# Patient Record
Sex: Female | Born: 1955 | Marital: Married | Smoking: Never smoker
Health system: Northeastern US, Community
[De-identification: ages and names within clinical notes are randomized; demographics above are authoritative.]

## PROBLEM LIST (undated history)

## (undated) DIAGNOSIS — H11153 Pinguecula, bilateral: Principal | ICD-10-CM

## (undated) DIAGNOSIS — H52209 Unspecified astigmatism, unspecified eye: Secondary | ICD-10-CM

## (undated) DIAGNOSIS — N63 Unspecified lump in unspecified breast: Secondary | ICD-10-CM

## (undated) DIAGNOSIS — A048 Other specified bacterial intestinal infections: Secondary | ICD-10-CM

## (undated) DIAGNOSIS — H1010 Acute atopic conjunctivitis, unspecified eye: Secondary | ICD-10-CM

## (undated) DIAGNOSIS — H5200 Hypermetropia, unspecified eye: Secondary | ICD-10-CM

## (undated) DIAGNOSIS — H524 Presbyopia: Secondary | ICD-10-CM

## (undated) DIAGNOSIS — N83209 Unspecified ovarian cyst, unspecified side: Secondary | ICD-10-CM

## (undated) DIAGNOSIS — Z8679 Personal history of other diseases of the circulatory system: Secondary | ICD-10-CM

## (undated) HISTORY — DX: Pinguecula, bilateral: H11.153

## (undated) HISTORY — DX: Unspecified lump in unspecified breast: N63.0

## (undated) HISTORY — PX: BREAST BIOPSY: SHX20

## (undated) HISTORY — DX: Presbyopia: H52.4

## (undated) HISTORY — DX: Unspecified ovarian cyst, unspecified side: N83.209

## (undated) HISTORY — PX: REDUCTION MAMMAPLASTY: REP20

## (undated) HISTORY — DX: Hypermetropia, unspecified eye: H52.00

## (undated) HISTORY — DX: Other specified bacterial intestinal infections: A04.8

## (undated) HISTORY — DX: Acute atopic conjunctivitis, unspecified eye: H10.10

## (undated) HISTORY — DX: Unspecified astigmatism, unspecified eye: H52.209

---
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
## 1978-06-21 HISTORY — PX: CRANIEC TREPHINE BONE FLP BRAIN TUMOR SUPRTENTOR: PRO0714

## 1988-06-21 HISTORY — PX: BRST RCNSTJ IMMT/DLYD W/TISS EXPANDER SBSQ XPNSJ: REP29

## 1992-06-21 HISTORY — PX: ROOT CANAL: SHX2363

## 1995-06-22 HISTORY — PX: HYSTERECTOMY: SHX81

## 1997-06-21 DIAGNOSIS — N83209 Unspecified ovarian cyst, unspecified side: Secondary | ICD-10-CM

## 1997-06-21 HISTORY — DX: Unspecified ovarian cyst, unspecified side: N83.209

## 1998-06-21 DIAGNOSIS — A048 Other specified bacterial intestinal infections: Secondary | ICD-10-CM

## 1998-06-21 HISTORY — DX: Other specified bacterial intestinal infections: A04.8

## 2006-06-30 ENCOUNTER — Emergency Department (HOSPITAL_BASED_OUTPATIENT_CLINIC_OR_DEPARTMENT_OTHER): Payer: Self-pay

## 2006-06-30 LAB — BLOOD COUNT COMPLETE AUTO&AUTO DIFRNTL WBC
BASOPHIL %: 0.1 % (ref 0.0–2.0)
EOSINOPHIL %: 0 % (ref 0.0–7.0)
HEMATOCRIT: 43.2 % (ref 36.0–48.0)
HEMOGLOBIN: 14.3 g/dl (ref 12.0–16.0)
LYMPHOCYTE %: 5 % — ABNORMAL LOW (ref 13.0–39.0)
MEAN CORP HGB CONC: 33.1 g/dl (ref 32.0–36.0)
MEAN CORPUSCULAR HGB: 28.5 pg (ref 27.0–33.0)
MEAN CORPUSCULAR VOL: 85.9 fl (ref 80.0–100.0)
MEAN PLATELET VOLUME: 8 fl (ref 6.4–10.8)
MONOCYTE %: 4.2 % (ref 1.0–12.0)
NEUTROPHIL %: 90.7 % (ref 46.0–79.0)
PLATELET COUNT: 268 10*3/uL (ref 150–400)
RBC DISTRIBUTION WIDTH: 13.8 % (ref 11.5–14.3)
RED BLOOD CELL COUNT: 5.03 M/uL (ref 4.50–5.10)
WHITE BLOOD CELL COUNT: 8.7 10*3/uL (ref 4.0–10.8)

## 2006-06-30 LAB — URINALYSIS
BILIRUBIN, URINE: NEGATIVE
CASTS: NONE SEEN PER LPF
CRYSTALS: NONE SEEN
GLUCOSE, URINE: NEGATIVE MG/DL
KETONE, URINE: 40 MG/DL — AB
LEUKOCYTE ESTERASE: NEGATIVE
NITRITE, URINE: NEGATIVE
PH URINE: 5.5 (ref 5.0–8.0)
PROTEIN, URINE: NEGATIVE MG/DL
SPECIFIC GRAVITY URINE: >=1.03 (ref 1.003–1.035)
SQUAMOUS EPITHELIAL CELLS: >10 PER LPF — AB (ref 0–2)

## 2006-06-30 LAB — ICTOTEST: ICTOTEST: NEGATIVE

## 2006-06-30 LAB — BASIC METABOLIC PANEL
ANION GAP: 6 mmol/L (ref 2–25)
BUN (UREA NITROGEN): 13 mg/dl (ref 6–20)
CALCIUM: 9.2 mg/dl (ref 8.6–10.0)
CARBON DIOXIDE: 27 mmol/L (ref 22–32)
CHLORIDE: 103 mmol/L (ref 101–111)
CREATININE: 0.8 mg/dl (ref 0.4–1.2)
Glucose Random: 108 mg/dl (ref 74–160)
POTASSIUM: 3.4 mmol/L — ABNORMAL LOW (ref 3.5–5.1)
SODIUM: 136 mmol/L (ref 135–144)

## 2006-06-30 LAB — WBC DIFFERENTIAL SCAN

## 2006-06-30 LAB — HOLD BLUE TOP TUBE

## 2006-06-30 LAB — URINALYSIS DIPSTICK

## 2006-06-30 LAB — CHG ASSAY OF MAGNESIUM: MAGNESIUM: 2 mg/dl (ref 1.8–2.6)

## 2006-07-04 LAB — URINE CULTURE/COLONY COUNT

## 2006-07-04 LAB — EKG

## 2006-07-08 ENCOUNTER — Ambulatory Visit (HOSPITAL_BASED_OUTPATIENT_CLINIC_OR_DEPARTMENT_OTHER): Payer: Self-pay

## 2006-07-19 ENCOUNTER — Encounter (HOSPITAL_BASED_OUTPATIENT_CLINIC_OR_DEPARTMENT_OTHER): Payer: Self-pay | Admitting: Family Medicine

## 2006-07-19 ENCOUNTER — Encounter (HOSPITAL_BASED_OUTPATIENT_CLINIC_OR_DEPARTMENT_OTHER): Payer: Self-pay

## 2006-07-19 ENCOUNTER — Ambulatory Visit (HOSPITAL_BASED_OUTPATIENT_CLINIC_OR_DEPARTMENT_OTHER): Payer: HMO

## 2006-07-19 VITALS — BP 120/70 | HR 76 | Temp 98.5°F | Resp 18 | Wt 135.0 lb

## 2006-07-19 DIAGNOSIS — R112 Nausea with vomiting, unspecified: Secondary | ICD-10-CM

## 2006-07-19 LAB — COMPREHENSIVE METABOLIC PANEL
ALANINE AMINOTRANSFERASE: 15 IU/L (ref 7–35)
ALBUMIN: 3.8 g/dl (ref 3.4–4.8)
ALKALINE PHOSPHATASE: 62 IU/L (ref 25–106)
ANION GAP: 2 mmol/L (ref 2–25)
ASPARTATE AMINOTRANSFERASE: 19 IU/L (ref 8–34)
BILIRUBIN TOTAL: 0.4 mg/dl (ref 0.2–1.1)
BUN (UREA NITROGEN): 10 mg/dl (ref 6–20)
CALCIUM: 9.2 mg/dl (ref 8.6–10.0)
CARBON DIOXIDE: 30 mmol/L (ref 22–32)
CHLORIDE: 108 mmol/L (ref 101–111)
CREATININE: 0.8 mg/dl (ref 0.4–1.2)
Glucose Random: 97 mg/dl (ref 74–160)
POTASSIUM: 4.5 mmol/L (ref 3.5–5.1)
SODIUM: 140 mmol/L (ref 135–144)
TOTAL PROTEIN: 6.6 g/dl (ref 5.9–7.5)

## 2006-07-19 NOTE — Progress Notes (Signed)
SUBJECTIVE:  Kayla Garza is a 51 year old female who presents to establish care with new provider and ED follow-up. Pt presented to ED on 06/30/06 for nausea and vomiting, diagnosed with viral gastroenteritis, symptoms resolved. Pt developed  vomiting for 2hrs on this past thursday, no blood, no bile, +nausea but no abd pain, no fever, no diarrhea, no jaundice. Symptoms resolved completely after 2 hours. +sick contact at work: cold symptoms. Pt denies any heartburn or abdominal pain. Past medical history significant for h. Pylori infection, s/p treatment for 2 months about 8 years ago.  Family hx: mother had stomach ulcers    OBJECTIVE:  Vitals: BP: 120/70 P: 76 T: 98.5 F (36.9 C) T Src: Oral Resp: 18 W: 135 lbs  General: pleasant Portuguese-speaking mid-aged female, sitting comfortably in chair, NAD  HEENT: PERRL, EOMI, mmm  Neck: supple, no thyromegaly, no lymphadenopathy, no JVD  Chest: CTA bilaterally, no wheezes or crackles  Cardiac: RRR, normal S1 and S2 present, no murmurs/rubs/gallops  ABD: soft, non-distended, normal BS, non-tender, no splenomegaly, mild hepatomegaly (about 0.5cm below costal margin)    ASSESSMENT/PLAN:  51 year old female presents with 2 episodes of nausea/vomiting, now resolved. Initial episode likely secondary to viral gastroenteritis. Differential diagnosis also includes hepatic etiology, in light of mildy enlarged liver on exam.  - will obtain CMP today  - f/u in 1 month for CPEX  - call or RTC sooner if symptoms persist or worsen    [name], MD    CASE DISCUSSED WITH DR. Alda Lea

## 2006-07-23 NOTE — Progress Notes (Addendum)
I precepted this 51 yo woman presenting for care after recent visit to ER for acute diarrhea and vomiting illness. Mild hepatomegaly noted on exam. I have reviewed the documented note by Dr. Melene Muller and agree with her assessments and plans as described.

## 2006-08-17 ENCOUNTER — Ambulatory Visit (HOSPITAL_BASED_OUTPATIENT_CLINIC_OR_DEPARTMENT_OTHER): Payer: HMO

## 2006-08-17 ENCOUNTER — Encounter (HOSPITAL_BASED_OUTPATIENT_CLINIC_OR_DEPARTMENT_OTHER): Payer: Self-pay

## 2006-08-17 VITALS — BP 110/70 | HR 69 | Temp 98.5°F | Resp 20 | Ht 63.0 in | Wt 139.4 lb

## 2006-08-17 DIAGNOSIS — R1013 Epigastric pain: Secondary | ICD-10-CM

## 2006-08-17 DIAGNOSIS — Z0000 Encounter for general adult medical examination without abnormal findings: Secondary | ICD-10-CM

## 2006-08-17 NOTE — Progress Notes (Signed)
Chief Complaint:  Kayla Garza is a 51 year old female who presents for a physical exam. Patient has the following concern:    1. Epigastric pain: occurs almost every day, occurs mostly when pt is hungry, does not occurs after meals, pain improves with meals, no melena or hematochezia, no fever, no weight loss, no nausea or vomiting, no diarrhea, has not tried any meds.      No current outpatient prescriptions on file.      Allergies:    No Known Allergies.    Health Maintenance:      Immunizations:  Immunization History   TD 06/21/1997     Histories:  Past Medical History:   HELICOBACTER PYLORI INFECTION 2000    Comment: s/p treatment for 2 months   OVARIAN CYST NEC/NOS 1999   Past Surgical History:   REMOVAL OF BRAIN LESION 1980    BREAST RECONSTRUCTION 1990    Comment: s/p removal of excess skin   TOTAL HYSTERECTOMY 1997    Comment: surgery for uterine fibroids  Social History   Marital Status: Married Spouse Name: Teacher, English as a foreign language    Years of Education: Number of children: 0     Occupational History   None on file    Social History Main Topics   Tobacco Use: Never    Alcohol Use: No    Drug Use: Not on file    Sexual Activity: Yes Partners with: Female    Other Topics Concern   None on file    Social History Narrative   Works with Public house manager, lives with husband, feels safe      Review of patient's family history indicates:   Hypertension Mother    Osteoporosis Mother    Psychiatric Illness Brother    Comment: depression      Gyn: menarche age 84, married for 15 years, 3 lifetime partners, no h/o STD's, no abnormal pap smears    Review of Systems:   Skin: negative  Eyes: negative  Ears/Nose/Throat: negative  Respiratory: negative  Cardiovascular: negative  Gastrointestinal: see HPI  Genitourinary: negative  Gyn:  no breast pain or new or enlarging lumps on self exam  no vaginal bleeding    Musculoskeletal: negative  Neurologic: negative  Psychiatric: negative  Hematologic/Lymphatic/Immunologic:  negative    Physical:  BP 110/70   Pulse 69   Temp (Src) 98.5 (Oral)   Resp 20   Ht 5\' 3"  (1.31m)   Wt 139 lbs 6.4 oz (63.2kg)   SaO2 100%   LMP Hysterectomy  General appearance: healthy, alert, well developed, well nourished, cooperative, smiling, no distress  Eyes: conjunctivae/corneas clear. PERRL, EOM's intact. Fundi benign  Skin: skin color, texture, turgor are normal  Head: Normocephalic. No masses, lesions, tenderness or abnormalities  Ears: External ears normal. Canals clear. TM's normal.  Nose/Sinuses: Nares normal. Septum midline. Mucosa normal. No drainage or sinus tenderness.  Oropharynx: Lips, mucosa, and tongue normal. Teeth and gums normal. Oropharynx moist and without lesion  Neck: Neck supple. No adenopathy. Thyroid symmetric, normal size, and without nodularity  Back: Back symmetric, no curvature. ROM normal. No CVA tenderness.  Lungs: Percussion normal. Good diaphragmatic excursion. Lungs clear to auscultation bilaterally  Heart: PMI normal. No lifts, heaves, or thrills. RRR. No murmurs, clicks, gallops or rubs  Breast: breasts symmetric, no dominant or suspicious mass, no skin or nipple changes, no axillary adenopathy and self exam in taught and encouraged  Abdomen: Abdomen soft, non-tender. BS normal. No masses, no organomegaly  Extremities: Extremities  normal. No deformities, edema, or skin discoloration  Musculoskeletal: Spine ROM normal. Muscular strength intact.  Peripheral pulses: negative  Neuro: Gait normal. Reflexes normal and symmetric. Sensation grossly normal   Pelvic: normal adnexa, s/p hysterectomy  Rectal: negative, no tenderness noted, stool guiaic negative    Health Counseling:  Diet, Exercise, Wt. Control: Reviewed and Discussed  Dental Health: Reviewed and Discussed  Vision Health: Reviewed and Discussed  Mental Health: Reviewed and Discussed  Domestic Violence: Reviewed and Discussed  BSE: Reviewed and Discussed  Osteoperosis: Reviewed and Discussed      ASSESSMENT/PLAN:  51  year old female presents for CPEX    V70.0 ROUTINE MEDICAL EXAM (primary encounter diagnosis)  Note: healthy 51 year old female  Plan:   - immunizations reviewed and uptodate  - health maintenance counseling performed  - mammogram order sheet given, pt will schedule appt  - pap smear not indicated, s/p hysterectomy for benign fibroids, normal pap smear in [place] since surgery  - will discuss fasting lipid panel at next office visit    789.06 ABDOMINAL PAIN EPIGASTRIC  Note: non-ulcerative dyspepsia vs. PUD  Plan:   - HELICOBACTER PYLORI STOOL AG (INHOUSE)  - f/u in 6 weeks  - consider PPI therapy if negative stool antigen test and persitent symptoms      [name], MD    CASE DISCUSSED WITH DR. Alda Lea

## 2006-08-21 NOTE — Progress Notes (Addendum)
I precepted this 51 yo woman presenting for complete physical with Dr. Melene Muller. Chronic midepigastric abdominal pain, dyspepsia with hx of Hpylori infection addressed by obtaining stool antigen for this organism and anticipate trial of PPI at later date. I have reviewed the documented note and agree with the assessments and plans as described.

## 2006-08-24 ENCOUNTER — Other Ambulatory Visit (HOSPITAL_BASED_OUTPATIENT_CLINIC_OR_DEPARTMENT_OTHER): Payer: HMO

## 2006-08-24 DIAGNOSIS — R1013 Epigastric pain: Secondary | ICD-10-CM

## 2006-08-24 NOTE — Progress Notes (Signed)
LAB DROP OFF

## 2006-08-25 LAB — HELICOBACTER PYLORI STOOL AG

## 2006-09-06 ENCOUNTER — Ambulatory Visit (HOSPITAL_BASED_OUTPATIENT_CLINIC_OR_DEPARTMENT_OTHER): Payer: Self-pay

## 2006-09-06 LAB — EMERGENCY ROOM NOTE

## 2006-10-14 ENCOUNTER — Ambulatory Visit (HOSPITAL_BASED_OUTPATIENT_CLINIC_OR_DEPARTMENT_OTHER): Payer: HMO

## 2006-10-14 VITALS — BP 114/82 | HR 78 | Temp 98.6°F | Resp 20 | Wt 140.2 lb

## 2006-10-14 DIAGNOSIS — Z13220 Encounter for screening for lipoid disorders: Secondary | ICD-10-CM

## 2006-10-14 DIAGNOSIS — R1013 Epigastric pain: Secondary | ICD-10-CM

## 2006-10-14 DIAGNOSIS — M25569 Pain in unspecified knee: Secondary | ICD-10-CM

## 2006-10-14 NOTE — Progress Notes (Signed)
PRECEPTOR NOTE  On the day of the patient's visit, I reviewed findings with the resident.  I confirm the key elements of history and physical exam as described in resident's note.  I agree with the assessment and plan as described below.  Please see resident's note for further details.

## 2006-10-14 NOTE — Progress Notes (Signed)
SUBJECTIVE:  Kayla Garza is a 51 year old female who presents for follow-up of abdominal pain. Pain now occurs once weekly, mostly after fatty meals, no n/v, no fever/chills, no blood in stool, no weight loss. She has not tried any medications.   Patient also reports right knee pain: started 8 months ago, dull non-radiating pain, progressively worse, occurs at night, injured right knee in August 2007, no clicking but +locking/giving way, no effusion or erythema, no numbness/weakness.    OBJECTIVE:  BP 114/82   Pulse 78   Temp (Src) 98.6 F (37 C) (Oral)   Resp 20   Wt 140 lb 3.2 oz (63.594 kg)   SpO2 99%   LMP Hysterectomy  General: pleasant mid-aged [place] female, sitting comfortably, NAD  Chest: CTA bilaterally, no wheezes or crackles  Cardiac: RRR, normal S1 and S2 present, no murmurs/rubs/gallops  ABD: soft, non-distended, normal BS, non-tender  Right knee: +McMurray's with internal rotation, point tenderness in medial area (about 1cm from medial aspect of patella), full ROM, no joint effusion or erythema, sensation to blunt touch grossly intact, 5/5 lower extremity strength b/l, 2+ patellar DTR    ASSESSMENT/PLAN:  51 year old female presents with epigastric pain and right knee pain.    789.06 Abdominal Pain, Epigastric (primary encounter diagnosis)  Comment: possibly due to cholelithiasis  Plan:   - will obtain RUQ ultrasound for further evaluation  - call or RTC sooner if symptoms persist or worsen    ROUTINE HEALTH CARE MAINTENANCE  Plan:   - pt will return for fasting LIPID PANEL, ROUTINE VENIPUNCTURE  - she will reschedule appt for mammogram    719.46D Knee Pain  Comment: clinical picture consistent with meniscal tear  Plan:  - will obtain R knee x-ray to r/o bony abnormalities  - ibuprofen 400mg  po TID for 2 weeks, ice prn  - follow-up in 4 weeks  - consider joint corticosteroid injection if pain persists      [name], MD    CASE DISCUSSED WITH DR. Azucena Fallen

## 2006-10-14 NOTE — Patient Instructions (Signed)
Ibuprofena 400 mg 3 vezes por dia (tomar com comida)    Gelo ao joelho

## 2006-10-20 ENCOUNTER — Other Ambulatory Visit (HOSPITAL_BASED_OUTPATIENT_CLINIC_OR_DEPARTMENT_OTHER): Payer: Self-pay | Admitting: Family Medicine

## 2006-10-21 LAB — XR KNEE RIGHT 3 VIEWS

## 2006-11-04 LAB — MA SCREENING MAMMO BILATERAL WITH CAD

## 2006-11-11 ENCOUNTER — Ambulatory Visit (HOSPITAL_BASED_OUTPATIENT_CLINIC_OR_DEPARTMENT_OTHER): Payer: Self-pay

## 2006-11-15 LAB — MA DIAGNOSTIC MAMMO UNILATERAL RIGHT WITH CAD

## 2006-11-15 LAB — US BREAST-AXILLA RIGHT

## 2006-11-28 ENCOUNTER — Ambulatory Visit (HOSPITAL_BASED_OUTPATIENT_CLINIC_OR_DEPARTMENT_OTHER): Payer: HMO

## 2006-12-16 ENCOUNTER — Ambulatory Visit (HOSPITAL_BASED_OUTPATIENT_CLINIC_OR_DEPARTMENT_OTHER): Payer: BLUE CROSS/BLUE SHIELD

## 2006-12-16 VITALS — BP 120/80 | HR 68 | Temp 98.5°F | Resp 18 | Wt 140.0 lb

## 2006-12-16 DIAGNOSIS — M25569 Pain in unspecified knee: Secondary | ICD-10-CM

## 2006-12-16 MED ORDER — IBUPROFEN 600 MG PO TABS
ORAL_TABLET | ORAL | Status: AC
Start: 2006-12-16 — End: 2006-12-30

## 2006-12-16 NOTE — Progress Notes (Signed)
SUBJECTIVE:  Kayla Garza is a 51 year old female who presents for follow-up of right Garza pain. 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 Pt did not take ibuprofen as previously prescribed at last office visit.    OBJECTIVE:  BP 120/80   Pulse 68   Temp (Src) 98.5 F (36.9 C) (Oral)   Resp 18   Wt 140 lb (63.504 kg)   LMP Hysterectomy  General: pleasant mid-aged [place] female, sitting comfortably, NAD  Right Garza: negative McMurray's test, point tenderness in medial area (about 1cm from medial aspect of patella), full ROM, no joint effusion or erythema, sensation to blunt touch grossly intact, 5/5 lower extremity strength b/l, 2+ patellar DTR, normal hip exam bilaterally except for pain elicited with internal rotation.    Garza x-ray: normal    ASSESSMENT/PLAN:  51 year old female presents with right Garza pain, possibly due to meniscal tear vs. Hip etiology  - ibuprofen 600mg  po TID for 2 weeks, then may use prn  - may use PPI OTC daily for GI prophylaxis  - follow-up in 4 weeks  - consider bilateral hip weight-bearing views if symptoms persist or worsen      Kayla Knee, MD    CASE DISCUSSED WITH DR. PALACIO

## 2006-12-20 NOTE — Progress Notes (Addendum)
This case was d/w Dr. Batista. Agree with findings and plan as documented in her note.

## 2007-01-12 ENCOUNTER — Ambulatory Visit (HOSPITAL_BASED_OUTPATIENT_CLINIC_OR_DEPARTMENT_OTHER): Payer: BLUE CROSS/BLUE SHIELD

## 2007-04-04 ENCOUNTER — Encounter (HOSPITAL_BASED_OUTPATIENT_CLINIC_OR_DEPARTMENT_OTHER): Payer: Self-pay

## 2007-04-05 ENCOUNTER — Encounter (HOSPITAL_BASED_OUTPATIENT_CLINIC_OR_DEPARTMENT_OTHER): Payer: Self-pay

## 2007-04-22 ENCOUNTER — Encounter (HOSPITAL_BASED_OUTPATIENT_CLINIC_OR_DEPARTMENT_OTHER): Payer: Self-pay

## 2007-04-22 ENCOUNTER — Emergency Department (HOSPITAL_BASED_OUTPATIENT_CLINIC_OR_DEPARTMENT_OTHER)
Admission: RE | Admit: 2007-04-22 | Disposition: A | Discharge status: Home / Self Care | Payer: Self-pay | Source: Emergency Department

## 2007-04-22 MED ORDER — LEVAQUIN 500 MG PO TABS
ORAL_TABLET | ORAL | Status: DC
Start: 2007-04-22 — End: 2007-04-23

## 2007-04-22 MED ORDER — HYCODAN 5-1.5 MG/5ML PO SYRP
ORAL_SOLUTION | ORAL | Status: AC
Start: 2007-04-22 — End: 2007-05-22

## 2007-04-22 NOTE — ED Notes (Signed)
<  body>  <div align="left"><font face="Arial"><span style="font-size:12pt">2 WEEKS OF DRY COUGH, WORSE PAST 24 HRS</span></font></div>  </body>

## 2007-04-22 NOTE — ED Notes (Signed)
<  body>  <div align="left"><font face="Arial"><span style="font-size:12pt">NO RELEIF WITH OTC COUGH MED</span></font></div>  </body>

## 2007-04-22 NOTE — Discharge Instructions (Signed)
Bronquite  (Bronchitis)    Seu médico diagnosticou que você tem bronquite. Bronquite é uma inflamação nos brônquios. Os brônquios são as passagens de ar ou tubos que se estendem da traquéia até os pulmões. Essa inflamação pode ser causada por vírus, bactéria, pó, alérgenos, poluição e outros agentes irritantes. Se a inflamação fica severa que pode causar brevidade de respiração.    causas  As células que revestem a árvore bronquial estão cobertas por minúsculos pêlos (cílios). Estes batem constantemente para cima e para longe dos pulmões em direção à boca para manter o pulmão livre de poluentes que poderiam afetar o nosso bem-estar. Quando essas células ficam irritadas e incapazes de trabalhar normalmente, começa a surgir muco, que provoca a tosse característica da bronquite. A tosse se torna o mecanismo de limpeza dos pulmões quando os cílios são incapazes de fazê-la. Sem esses mecanismos de defesa, o material ficaria nos pulmões e a pessoa desenvolveria uma pneumonia. Essa é uma das razões pelas quais é necessário evitar supressores de tosse, pois eles retiram essa proteção, podendo ser usados no caso da perda de respiração por causa da tosse.     Não há dúvida que o cigarro é a causa mais comum da bronquite. Parar de fumar é o mais importante que você pode fazer para ajudar.    tratamento  · Seu médico podem ter prescrito um antibiótico se ele sente que sua tosse é causada através de bactérias, e medicinas que abrem suas vias aéreas para fazer isto mais fácil respirar. Ele pode também recomendar ou prescrever um expectorante para eliminar o muco. Acetaminofeno (Tylenol®) ou ibuprofeno (Advil® ou Motrin®) podem ser usados para altas temperaturas ou dores associadas.  Se a bronquite continuar não tratada, ela pode tornar-se crônica (muco causando tosse que dura três ou mais meses repetindo-se todos as anos). Se a bronquite crônica progredir, ela pode ficar irreversível e a remoção do agente causador (cigarro, por  exemplo) não ajudará tanto ou não ajudará em nada. Nos primeiros anos desse problema, os raios X permanecerão normais.    Procure assistência médica imediatamente se:  · Se você desenvolver mais catarro purulento durante o tratamento.  Você tem uma febre descontrolado através de medicamentos.  Você fica progressivamente mais doente.   Você aumentou dificuldade respirando, enquanto ofegando, ou brevidade de respiração.    É necessário ir ao médico ou ao pronto-socorro imediatamente, principalmente se você é idoso ou estiver debilitado por outra doença.  Document Released: 06/07/2005  Document Re-Released: 03/24/2006  [employer]® Patient Information ©2008 [employer].

## 2007-04-23 LAB — XR CHEST 2 VIEWS

## 2007-04-23 MED ORDER — AVELOX 400 MG PO TABS
ORAL_TABLET | ORAL | Status: AC
Start: 2007-04-23 — End: 2007-05-23

## 2007-04-23 MED ORDER — LEVAQUIN 500 MG PO TABS
ORAL_TABLET | ORAL | Status: DC
Start: 2007-04-23 — End: 2007-04-23

## 2007-05-15 LAB — EMERGENCY ROOM NOTE

## 2007-09-12 ENCOUNTER — Ambulatory Visit (HOSPITAL_BASED_OUTPATIENT_CLINIC_OR_DEPARTMENT_OTHER): Payer: HMO

## 2007-09-12 VITALS — BP 120/88 | HR 88 | Temp 97.3°F | Resp 17 | Wt 154.2 lb

## 2007-09-12 DIAGNOSIS — R5383 Other fatigue: Secondary | ICD-10-CM

## 2007-09-12 DIAGNOSIS — M79606 Pain in leg, unspecified: Secondary | ICD-10-CM

## 2007-09-12 LAB — BLOOD COUNT COMPLETE AUTOMATED
HEMATOCRIT: 40.6 % (ref 36.0–48.0)
HEMOGLOBIN: 13.2 g/dl (ref 12.0–16.0)
MEAN CORP HGB CONC: 32.5 g/dl (ref 32.0–36.0)
MEAN CORPUSCULAR HGB: 28.8 pg (ref 27.0–33.0)
MEAN CORPUSCULAR VOL: 88.5 fl (ref 80.0–100.0)
MEAN PLATELET VOLUME: 8.9 fl (ref 6.4–10.8)
PLATELET COUNT: 270 10*3/uL (ref 150–400)
RBC DISTRIBUTION WIDTH: 14 % (ref 11.5–14.3)
RED BLOOD CELL COUNT: 4.58 M/uL (ref 4.50–5.10)
WHITE BLOOD CELL COUNT: 5.9 10*3/uL (ref 4.0–10.8)

## 2007-09-12 LAB — RAPID STREP (POINT OF CARE): STREP SCREEN: NEGATIVE

## 2007-09-12 LAB — CYANOCOBALAMIN VITAMIN B-12: VITAMIN B12: 253 pg/mL (ref 180–914)

## 2007-09-12 LAB — TSH (THYROID STIMULATING HORMONE): TSH (THYROID STIM HORMONE): 1.38 u[IU]/mL (ref 0.34–5.60)

## 2007-09-12 NOTE — Progress Notes (Signed)
SUBJECTIVE:  Kayla Garza is a 52 year old female who presents with multiple concerns.    # Leg pain  - occurs mostly at night, started 2 months ago, symptoms unchanged since then  - lower legs, especially calves  - no radiation  - burning sensation  - lasts all night long, changes position often in bed without improvement  - has not tried any medications  - no leg swelling, no rash  - no numbness or weakness  - pain is non-exertional    # Fatigue  - easily fatigued for 3 months, decreased energy  - gained 6.5 kgs in the past few months  - normal appetite, difficulty falling asleep and staying asleep  - no fever, occasional palpitations/throat closing up at times  - forgetful at times, normal concentration  - new onset constipation for the past 3 months  - no skin/hair/nail changes  - feeling more anxious lately, irritable when feeling tired    OBJECTIVE:  BP 120/88   Pulse 88   Temp (Src) 97.3 F (36.3 C) (Oral)   Resp 17   Wt 154 lb 3.2 oz (69.945 kg)   SpO2 99%   LMP Hysterectomy  General: well-appearing mid-aged [place] female, NAD  Neck: supple, no thyromegaly, no lymphadenopathy  Chest: CTA bilaterally, no wheezes or crackles  Cardiac: RRR, normal S1 and S2 present, no murmurs/rubs/gallops  ABD: soft, non-distended, normal BS, non-tender  Ext: warm, mild bilateral pretibial tenderness to palpation, no pedal edema, no discoloration or lesions, 2+ PT pulses b/l, symmetric patellar DTR's, 5/5 lower extremity strength, sensation to blunt touch grossly intact    Thyroid u/s 03/08/07 from [place]: diffuse parenchymal alterations, no nodule     ASSESSMENT/PLAN:  52 year old female presents with leg pain and fatigue.    780.79B Fatigue  (primary encounter diagnosis)  Comment: differential includes thyroid abnormality, depression and anemia.  Plan:   - THYROID STIM HORMONE  - COMPLETE CBC, AUTOMATED  - follow-up in 6 weeks  - call or RTC sooner if symptoms persist or worsen    729.5H Leg Pain  Comment:  likely neuropathic etiology, differential includes vitamin deficiencies.  Plan:   - VITAMIN D,25 HYDROXY  - VITAMIN B-12  - follow-up in 6 weeks  - call or RTC sooner if symptoms persist or worsen      [name], MD    CASE DISCUSSED WITH DR. PLATT

## 2007-09-12 NOTE — Progress Notes (Signed)
PRECEPTOR NOTE  I personally discussed and reviewed the history, physical, and plan with the resident.  Please see resident's note for further details.

## 2007-09-14 LAB — VITAMIN D,25 HYDROXY: VITAMIN D,25 HYDROXY: 11.9 ng/mL — AB (ref 32.0–100.0)

## 2007-09-18 ENCOUNTER — Telehealth (HOSPITAL_BASED_OUTPATIENT_CLINIC_OR_DEPARTMENT_OTHER): Payer: Self-pay | Admitting: Family Medicine

## 2007-09-18 DIAGNOSIS — E559 Vitamin D deficiency, unspecified: Secondary | ICD-10-CM

## 2007-09-18 DIAGNOSIS — E663 Overweight: Secondary | ICD-10-CM

## 2007-09-18 DIAGNOSIS — IMO0002 Reserved for concepts with insufficient information to code with codable children: Secondary | ICD-10-CM

## 2007-09-18 NOTE — Telephone Encounter (Signed)
Staff Message copied by Earlyne Iba on Mon Sep 18, 2007 6:56 PM  ------   Message from: Bufford Lope   Created: Mon Sep 18, 2007 6:43 PM   Regarding: call patient    Kayla Garza [id number], 52 year old, female, Telephone Information:  Home Phone [phone]  Work Phone [phone]      Cleotis Lema NUMBER: [phone]  Cell phone:   Other phone:    Available times:    Patient's language of care: [place]    Patient does not need an interpreter.    Patient's PCP: [name], MD    Person calling on behalf of patient: Patient (self)    Calls today   to speak to provider only.  Returning phonecall      Patient's Preferred Pharmacy:   [facility] [address] [place]  Phone: [phone] Fax: [phone]

## 2007-09-18 NOTE — Telephone Encounter (Signed)
PAteint states that she was returning Dr Theora Master call regarding her Bone scan.She is aware that pcp has left for the day and will wait until tomorrow

## 2007-09-19 DIAGNOSIS — IMO0002 Reserved for concepts with insufficient information to code with codable children: Secondary | ICD-10-CM

## 2007-09-19 DIAGNOSIS — E663 Overweight: Secondary | ICD-10-CM | POA: Insufficient documentation

## 2007-09-19 MED ORDER — ERGOCALCIFEROL 1.25 MG (50000 UT) PO CAPS
ORAL_CAPSULE | ORAL | Status: DC
Start: 2007-09-19 — End: 2013-02-16

## 2007-09-19 NOTE — Telephone Encounter (Signed)
Discussed with patient regarding low vitamin D as possible cause of burning sensation in lower extremities.  Ergocalciferol 50,000 units qweek for 8 weeks, then will repeat vitamin D level.    Pt would like a referral to nutrition due to her recent weight gain and being overweight.  Nutrition referral ordered, pt will schedule own appt with Lajean Manes.    [name], MD

## 2007-09-19 NOTE — Telephone Encounter (Signed)
Staff Message copied by Caffie Pinto on Tue Sep 19, 2007 12:22 PM  ------   Message from: [place], [place]   Created: Tue Sep 19, 2007 12:12 PM   Regarding: call back    Kayla Garza Geralynn Capri [id number], 52 year old, female, Telephone Information:  Home Phone [phone]  Work Phone [phone]      Cleotis Lema NUMBER: [phone]  Cell phone:   Other phone:    Available times:    Patient's language of care: [place]    Patient does not need an interpreter.    Patient's Care Team:     Person calling on behalf of patient: Patient (self)    Calls today Returning phonecall fromDr. Melene Muller re: blood work     Patient's Preferred Pharmacy: [facility] [address] [place]  Phone: [phone] Fax: [phone]

## 2007-10-30 ENCOUNTER — Ambulatory Visit (HOSPITAL_BASED_OUTPATIENT_CLINIC_OR_DEPARTMENT_OTHER): Payer: HMO

## 2007-10-30 VITALS — BP 128/80 | HR 79 | Temp 98.8°F | Wt 149.0 lb

## 2007-10-30 DIAGNOSIS — Z23 Encounter for immunization: Secondary | ICD-10-CM

## 2007-10-30 DIAGNOSIS — E559 Vitamin D deficiency, unspecified: Secondary | ICD-10-CM

## 2007-10-30 NOTE — Progress Notes (Signed)
10/30/2007  VIS given prior to administration and reviewed with the patient and or legal guardian. Patient understands the disease and the vaccine. See immunization/Injection module or chart review for date of publication and additional information.  Earlyne Iba, RN

## 2007-10-30 NOTE — Progress Notes (Signed)
SUBJECTIVE:  Kayla Garza is a 52 year old female who presents for follow-up of fatigue and leg pain.  Her symptoms have now resolved.  She has been taking vitamin D 1000 IU po qweek (was told by pharmacist that this would be equivalent to 50,000 IU qweek).      OBJECTIVE:  BP 128/80   Pulse 79   Temp (Src) 98.8 F (37.1 C) (Oral)   Wt 149 lb (67.586 kg)   SpO2 98%   LMP Hysterectomy  General: well-appearing female, NAD  Neck: supple, no thyromegaly, no lymphadenopathy, no JVD  Chest: CTA bilaterally, no wheezes or crackles  Cardiac: RRR, normal S1 and S2 present, no murmurs/rubs/gallops    ASSESSMENT/PLAN:  52 year old female presents with fatigue and leg pain secondary to vitamin D deficiency, symptoms have now resolved.  - d/c vitamin D 1000 IU qweek  - start vitamin D 16109 IU qweek for 8 weeks, called pharmacy to confirm appropriate dose  - follow-up in 2 months, will repeat vitamin D level at next visit  - HCM: pt is due for tetanus booster, vaccine given today      Kayla Knee, MD    CASE DISCUSSED WITH DR. Azucena Fallen

## 2007-10-30 NOTE — Progress Notes (Signed)
PRECEPTOR NOTE:  On the day of the patient's visit, I discussed the key elements of history and physical exam and I reviewed the findings with the resident.  I agree with the assessment and plan as described in their documentation.  Please see resident's note for further details.

## 2007-10-31 ENCOUNTER — Telehealth (HOSPITAL_BASED_OUTPATIENT_CLINIC_OR_DEPARTMENT_OTHER): Payer: Self-pay | Admitting: Ambulatory Care

## 2007-10-31 NOTE — Telephone Encounter (Signed)
Called patient back. She said she needs to speak to Dr. Melene Muller about her rx for vitamin D.

## 2007-10-31 NOTE — Telephone Encounter (Signed)
Called pharmacy back. They wanted to verify the units from the 3/31 rx given to patient. Advised that is was 50,000 units.

## 2007-10-31 NOTE — Telephone Encounter (Signed)
Staff Message copied by Caffie Pinto on Tue Oct 31, 2007 10:21 AM  ------   Message from: [place], [place]   Created: Tue Oct 31, 2007 10:09 AM   Regarding: med question   Contact: [phone]    Kayla Garza [id number], 52 year old, female, Telephone Information:  Home Phone [phone]  Work Phone [phone]  Mobile [phone]      Cleotis Lema NUMBER: [phone]  Best time to call back:   Cell phone:   Other phone:    Available times:    Patient's language of care: [place]    Patient does not need an interpreter.    Patient's PCP: [name], MD    Person calling on behalf of patient: [name])    Calls today with questions and concerns. Pl's call her back     Patient's Preferred Pharmacy:   [facility] [address] [place]  Phone: [phone] Fax: [phone]

## 2007-10-31 NOTE — Telephone Encounter (Signed)
Pt was concerned that the pharmacy gave her the wrong dose of vitamin D.  She picked up a prescription yesterday for Vit D 1000 IU qweek.  Pt remembered that the correct dosage should be 50,000 IU qweek.  I called the pharmacy directly, and the manager claims that there is a handwritten voicemail from Dr. Melene Muller for Vitamin D 1000 IU qweek from yesterday.  I assured the manager that I spoke to someone yesterday about pt needing the right dose: Vitamin D 50,000 IU qweek.  When pt had come in 2 months ago for this original script, she was told by someone at that pharmacy that 1000 IU qweek was equivalent to the higher dosage (and has been taking subtherapeutic doses of this med for the past 2 months).  I requested that the pharmacy fill the correct dose for the patient and not charge her.  I also requested a formal complaint to be filed as the pt has now received 2 wrong doses of Vitamin D.  Pt notified and will come in to pick up new script.    [name], MD

## 2007-10-31 NOTE — Telephone Encounter (Signed)
Staff Message copied by Caffie Pinto on Tue Oct 31, 2007 9:37 AM  ------   Message from: [place], [place]   Created: Tue Oct 31, 2007 9:36 AM   Regarding: call back    Andrienne Da Nalina Yeatman [id number], 52 year old, female, Telephone Information:  Home Phone [phone]  Work Phone [phone]  Mobile [phone]      Cleotis Lema NUMBER: [phone]  Cell phone:   Other phone:    Available times:    Patient's language of care: [place]    Patient does not need an interpreter.    Patient's Care Team:     Person calling on behalf of patient: Spouse    Calls today with questions and concerns. Re: meds. Pls call back would like to talk to Dr. Melene Muller.    Patient's Preferred Pharmacy: [facility] [address] [place]  Phone: [phone] Fax: [phone]

## 2007-11-16 ENCOUNTER — Telehealth (HOSPITAL_BASED_OUTPATIENT_CLINIC_OR_DEPARTMENT_OTHER): Payer: Self-pay | Admitting: Family Medicine

## 2007-11-16 NOTE — Telephone Encounter (Signed)
Spoke with patient reminded to make mammogram appointment prior to PE on 12-12-07, patient agrees with plan.

## 2007-11-16 NOTE — Telephone Encounter (Signed)
Staff Message copied by Velna Ochs on Thu Nov 16, 2007 3:55 PM  ------   Message from: [name]   Created: Thu Nov 16, 2007 3:53 PM   Regarding: Mammogram    This pt is due for 6 month follow-up mammogram. She was given an order sheet at the last visit. Can one of the nurses please call her & remind her to set up her appt prior to next visit with me?    Thanks,  [name], MD

## 2007-11-22 ENCOUNTER — Ambulatory Visit (HOSPITAL_BASED_OUTPATIENT_CLINIC_OR_DEPARTMENT_OTHER): Payer: Self-pay

## 2007-11-23 LAB — MA SCREENING MAMMO BILATERAL WITH CAD

## 2007-12-05 ENCOUNTER — Ambulatory Visit (HOSPITAL_BASED_OUTPATIENT_CLINIC_OR_DEPARTMENT_OTHER): Payer: HMO | Admitting: Registered"

## 2007-12-05 VITALS — Ht 63.0 in | Wt 149.0 lb

## 2007-12-05 DIAGNOSIS — E663 Overweight: Secondary | ICD-10-CM

## 2007-12-05 DIAGNOSIS — IMO0002 Reserved for concepts with insufficient information to code with codable children: Principal | ICD-10-CM

## 2007-12-05 NOTE — Progress Notes (Signed)
INITIAL NUTRITION ASSESSMENT / INTERVENTION    Beginning Time: 6:30 PM  End Time: 7:30 PM     Total Minutes: 60 minutes      Kayla Garza is a 52 year old female who presents with overweight.  Visit via [place], 1610, via phone.  The patient is from [place].  Patient's language is [place]. Patient able to read [place].  She has been in the [place] for 23 years.  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 Patient's work schedule is full-time and 45-50 hours/wk.  Patient lives with husband.     Subjective (including lifestyle changes): Patient reports cut sweets, more salad, more calcium sources.        Family History    Hypertension Mother    Osteoporosis Mother    Psychiatric Illness Brother    Comment: depression           Patient has had overweight for 7-12 months and has not had Medical Nutrition Therapy before today.      Current outpatient prescriptions:  ERGOCALCIFEROL 50000 UNIT OR CAPS 1 CAPSULE BY MOUTH ONCE WEEKLY FOR 8 WEEKS Disp: 8 Rfl: 0         Vitamins/Minerals/Herbal Supplements: per outpatient prescriptions above       Tobacco Use: Never    Alcohol Use: No       Review of Patient's Allergies indicates:  No Known Allergies.    Labs reviewed: Yes  (For list of labs type "dot" LLNUTA)    Vitals:  Most Recent BP Reading(s)    Date:        BP:    10/30/2007   128/80          Most Recent Height Reading(s)    Date:        Ht:    12/05/2007   5\' 3"  (1.6 m)      Most Recent Weight Reading(s)     Date:        Wt:     12/05/2007   149 lb (67.586 kg)     Weight change: Lost 5 pounds in the past 2 month(s).    Body mass index is 26.39 kg/(m^2).    BMI Category: 25-29.9 overweight    Physical Activity:  Type: walking, 1-2x/wk for 40 mins     Barriers to physical activity include: none    Activities of Daily Living: Independent    Chewing ability: no concerns   Swallowing ability: no concerns   Appetite: good    Food Allergies: No known allergies  Food Intolerances: none       Religious  restrictions/Special diet: None  Food purchased by: pt's husband       Food prepared by: pt  Meal sources: home cooked and take out / [employer] factors and food assist: none  Psychosocial factors / Mental Status: interactive with questions  Stress: Moderate  Readiness to learn: good    Dietary history / usual intake reveals patient attempts to follow reduced calories diet.  She eats 2-3 meals and sometimes snacks per day.      Portion size is excessive but improving.  Eating frequency is deficient  Eating habits are culturally based [place]    Estimated intake shows:   sub-optimal nutrients: monosaturated fats, vitamin C, fiber, fluid and water  excessive nutrients: calories and carbohydrates      Assessment/Plan/Conclusion:  Discussed with patient the good changes she has made over the past few weeks; cut desserts and  sweets, trying to eat more veggies. Reviewed plate picture as well as increasing activity per MD OK to more than 2x/wk. Agreeable to ideas.    Follow-up: 2 months.    Freddi Che

## 2007-12-09 ENCOUNTER — Ambulatory Visit (HOSPITAL_BASED_OUTPATIENT_CLINIC_OR_DEPARTMENT_OTHER): Payer: HMO

## 2007-12-12 ENCOUNTER — Ambulatory Visit (HOSPITAL_BASED_OUTPATIENT_CLINIC_OR_DEPARTMENT_OTHER): Payer: HMO

## 2008-02-07 ENCOUNTER — Ambulatory Visit (HOSPITAL_BASED_OUTPATIENT_CLINIC_OR_DEPARTMENT_OTHER): Payer: HMO | Admitting: Registered"

## 2009-05-11 ENCOUNTER — Emergency Department (HOSPITAL_BASED_OUTPATIENT_CLINIC_OR_DEPARTMENT_OTHER)
Admission: RE | Admit: 2009-05-11 | Disposition: A | Discharge status: Home / Self Care | Payer: Self-pay | Source: Emergency Department | Attending: Emergency Medicine | Admitting: Emergency Medicine

## 2009-05-11 ENCOUNTER — Encounter (HOSPITAL_BASED_OUTPATIENT_CLINIC_OR_DEPARTMENT_OTHER): Payer: Self-pay

## 2009-05-11 LAB — XR CHEST 2 VIEWS

## 2009-05-11 MED ORDER — ONDANSETRON HCL 8 MG PO TABS
ORAL_TABLET | ORAL | Status: AC
Start: 2009-05-11 — End: 2009-05-15

## 2009-05-11 MED ORDER — ACETAMINOPHEN-CODEINE #3 300-30 MG PO TABS
ORAL_TABLET | ORAL | Status: AC
Start: 2009-05-11 — End: 2009-05-18

## 2009-05-11 NOTE — ED Notes (Signed)
Ambulatory to the ed.  Pt c/o cough, productive of white sputum.  Dry sore throat.  Taking tylenol otc cold med for same.  Also c/o nausea and vomiting.  Vomited last night and this am.  Denies abd pain.  No other c/o at this time.  Also c/o left upper back pain.

## 2009-05-12 LAB — EMERGENCY ROOM NOTE

## 2009-08-05 ENCOUNTER — Encounter (HOSPITAL_BASED_OUTPATIENT_CLINIC_OR_DEPARTMENT_OTHER): Payer: Self-pay | Admitting: Family Medicine

## 2009-08-20 ENCOUNTER — Ambulatory Visit (HOSPITAL_BASED_OUTPATIENT_CLINIC_OR_DEPARTMENT_OTHER): Payer: Commercial Managed Care - HMO | Admitting: Family

## 2009-08-20 VITALS — BP 110/72 | Ht 63.0 in | Wt 148.0 lb

## 2009-08-20 DIAGNOSIS — Z13220 Encounter for screening for lipoid disorders: Secondary | ICD-10-CM

## 2009-08-20 DIAGNOSIS — E559 Vitamin D deficiency, unspecified: Secondary | ICD-10-CM

## 2009-08-20 DIAGNOSIS — IMO0002 Reserved for concepts with insufficient information to code with codable children: Secondary | ICD-10-CM

## 2009-08-20 DIAGNOSIS — I499 Cardiac arrhythmia, unspecified: Secondary | ICD-10-CM

## 2009-08-20 DIAGNOSIS — E663 Overweight: Secondary | ICD-10-CM

## 2009-08-20 DIAGNOSIS — Z0000 Encounter for general adult medical examination without abnormal findings: Secondary | ICD-10-CM

## 2009-08-20 DIAGNOSIS — H5462 Unqualified visual loss, left eye, normal vision right eye: Secondary | ICD-10-CM

## 2009-08-20 NOTE — Progress Notes (Signed)
Subjective: Pt presented to the office for routine physical exam.  Patient complained of one minute vision loss of her left eye three weeks ago. Pt was watching television.  No other associated symptoms related to occurrence. Pt reports vision return quickly with no interventions and has not had a similar occurrence since.      Pt report a dizzy spell 1 month ago. Pt stated it started in the evening and continued for five hours until she went to bed. Pt reported nausea and vomiting with the episode.  Pt stated she had eaten a meal she prepared for her self that am.  Symptoms were resolved with rest. Pt denies sick contact. Pt has not experienced an episode since.  Pt denies any other associated symptoms.      Review of Systems   Constitutional: Negative.    HENT: Negative.  Negative for hearing loss and neck pain.    Eyes: Negative for blurred vision, double vision, pain and redness.        Resolved idiopathic 1 minute vision loss of L eye three weeks ago   Respiratory: Negative.  Negative for cough, sputum production, shortness of breath and wheezing.    Cardiovascular: Negative.  Negative for chest pain, palpitations and leg swelling.   Gastrointestinal: Negative.    Genitourinary: Negative.  Negative for dysuria, urgency and frequency.   Musculoskeletal: Negative.  Negative for back pain.   Skin: Negative.    Neurological: Negative for dizziness, tingling, speech change, loss of consciousness and headaches.        One episode of dizziness 1 month ago with nausea and vomiting.   Psychiatric/Behavioral: Negative for depression. The patient is not nervous/anxious and does not have insomnia.                           PHYSICAL EXAMINATION:  BP 110/72   Ht 5\' 3"  (1.6 m)   Wt 148 lb (67.132 kg)  General appearance: healthy, alert, well developed, well nourished  Skin: skin color, texture, turgor are normal  Head: Normocephalic. No masses, lesions, tenderness or abnormalities  Eyes: conjunctivae/corneas clear. PERRL,  EOM's intact. Fundi benign  Ears: External ears normal. Canals clear. TM's normal.  Nose/Sinuses: Nares normal. Septum midline. Mucosa normal. No drainage or sinus tenderness.  Oropharynx: Lips, mucosa, and tongue normal. Teeth and gums normal. Oropharynx moist and without lesion  Neck: Neck supple. No adenopathy. Thyroid symmetric, normal size, and without nodularity  Back: Back symmetric, no curvature. ROM normal. No CVA tenderness.  Lungs: Percussion normal. Good diaphragmatic excursion. Lungs clear to auscultation bilaterally with occasional expiratory wheeze in upper airway  Heart: PMI normal. No lifts, heaves, or thrills. RRR. No murmurs, clicks, gallops or rubs  Abdomen: Abdomen soft, non-tender. BS normal. No masses, no organomegaly  Extremities: Extremities normal. No deformities, edema, or skin discoloration  Musculoskeletal: Spine ROM normal. Muscular strength intact.  Peripheral pulses: radial=4/4, femoral=4/4, popliteal=4/4, dorsalis pedis=4/4  Neuro: Gait normal. Reflexes normal and symmetric. Sensation grossly normal     Assessment / Plan  V70.0N Physical exam, annual  (primary encounter diagnosis)  278.02L Overweight (BMI 25.0-29.9)  V77.91G Screening, lipid  Comment: Pt due for annual exam.  Plan: LIPID PANEL, ROUTINE VENIPUNCTURE  With contact patient with results.    369.8K Vision loss of left eye, momentary  Comment:Pt reports one episode three weeks ago of L eye vision loss. No symptoms today. No deficits on physical exam.  Plan: REFERRAL TO OPHTHALMOLOGY (  INT)  Pt told to call [facility] to schedule appointment.  F/u with [facility] PRN if symptom returns or worsens.    427.9BJ Irregular heart beat  Comment: Sinus arrhythmia altered with respiration.  No signs of palpation or chest pain.  Plan: F/u PRN if symptoms present.

## 2009-08-20 NOTE — Progress Notes (Signed)
Case presented by FNP student  Repeated eye exam and PERRL, clear scelara, no injection;;   Speech clear and enunciatation appropriate  Cardiac ROS negative; BP stable      Agree with plan to refer to opth as i see no indication of urgent issue

## 2009-08-21 NOTE — Progress Notes (Addendum)
Addended by: Maryan Rued on: 08/21/2009      Modules accepted: Level of Service

## 2009-08-25 LAB — CHG LIPID PANEL
Cholesterol: 191 mg/dl (ref 0–200)
HIGH DENSITY LIPOPROTEIN: 50 mg/dl (ref 35–85)
LOW DENSITY LIPOPROTEIN DIRECT: 117 mg/dl — ABNORMAL HIGH (ref 0–100)
RISK FACTOR: 3.8 (ref ?–4.4)
TRIGLYCERIDES: 71 mg/dl (ref 0–150)

## 2009-08-25 LAB — VITAMIN D,25 HYDROXY: VITAMIN D,25 HYDROXY: 25.9 ng/ml — ABNORMAL LOW (ref 30.0–100.0)

## 2009-08-26 MED ORDER — VITAMIN D 400 UNITS PO CAPS
400.0000 [IU] | ORAL_CAPSULE | Freq: Every day | ORAL | Status: DC
Start: 2009-08-26 — End: 2010-07-21

## 2009-08-26 NOTE — Progress Notes (Addendum)
Addended by: Maryan Rued on: 08/26/2009      Modules accepted: Orders

## 2009-09-05 ENCOUNTER — Ambulatory Visit (HOSPITAL_BASED_OUTPATIENT_CLINIC_OR_DEPARTMENT_OTHER): Payer: Commercial Managed Care - HMO | Admitting: Ophthalmology

## 2010-07-21 ENCOUNTER — Ambulatory Visit (HOSPITAL_BASED_OUTPATIENT_CLINIC_OR_DEPARTMENT_OTHER): Payer: BLUE CROSS/BLUE SHIELD | Admitting: Internal Medicine

## 2010-07-21 ENCOUNTER — Encounter (HOSPITAL_BASED_OUTPATIENT_CLINIC_OR_DEPARTMENT_OTHER): Payer: Self-pay | Admitting: Internal Medicine

## 2010-07-21 VITALS — BP 120/80 | HR 83 | Temp 97.6°F | Ht 60.24 in | Wt 152.0 lb

## 2010-07-21 DIAGNOSIS — IMO0002 Reserved for concepts with insufficient information to code with codable children: Secondary | ICD-10-CM

## 2010-07-21 DIAGNOSIS — M549 Dorsalgia, unspecified: Secondary | ICD-10-CM

## 2010-07-21 DIAGNOSIS — E559 Vitamin D deficiency, unspecified: Secondary | ICD-10-CM

## 2010-07-21 DIAGNOSIS — Z1211 Encounter for screening for malignant neoplasm of colon: Secondary | ICD-10-CM

## 2010-07-21 DIAGNOSIS — R42 Dizziness and giddiness: Secondary | ICD-10-CM

## 2010-07-21 DIAGNOSIS — N83209 Unspecified ovarian cyst, unspecified side: Secondary | ICD-10-CM

## 2010-07-21 DIAGNOSIS — Z1239 Encounter for other screening for malignant neoplasm of breast: Secondary | ICD-10-CM

## 2010-07-21 DIAGNOSIS — N951 Menopausal and female climacteric states: Secondary | ICD-10-CM

## 2010-07-21 DIAGNOSIS — E663 Overweight: Secondary | ICD-10-CM

## 2010-07-21 DIAGNOSIS — J309 Allergic rhinitis, unspecified: Secondary | ICD-10-CM

## 2010-07-21 LAB — CBC, PLATELET & DIFFERENTIAL
BASOPHIL %: 0.4 % (ref 0.0–2.0)
EOSINOPHIL %: 1 % (ref 0.0–7.0)
HEMATOCRIT: 36.1 % (ref 36.0–48.0)
HEMOGLOBIN: 12.2 g/dl (ref 12.0–16.0)
LYMPHOCYTE %: 29.7 % (ref 13.0–39.0)
MEAN CORP HGB CONC: 33.8 g/dl (ref 32.0–36.0)
MEAN CORPUSCULAR HGB: 29.4 pg (ref 27.0–33.0)
MEAN CORPUSCULAR VOL: 87 fl (ref 80.0–100.0)
MEAN PLATELET VOLUME: 9.1 fl (ref 6.4–10.8)
MONOCYTE %: 7.4 % (ref 1.0–12.0)
NEUTROPHIL %: 61.5 % (ref 46.0–79.0)
PLATELET COUNT: 250 10*3/uL (ref 150–400)
RBC DISTRIBUTION WIDTH: 14.6 % — ABNORMAL HIGH (ref 11.5–14.3)
RED BLOOD CELL COUNT: 4.15 M/uL — ABNORMAL LOW (ref 4.50–5.10)
WHITE BLOOD CELL COUNT: 5.3 10*3/uL (ref 4.0–10.8)

## 2010-07-21 LAB — COMPREHENSIVE METABOLIC PANEL
ALANINE AMINOTRANSFERASE: 23 IU/L (ref 7–35)
ALBUMIN: 3.9 g/dl (ref 3.4–4.8)
ALKALINE PHOSPHATASE: 64 IU/L (ref 25–106)
ANION GAP: 6 mmol/L (ref 2–25)
ASPARTATE AMINOTRANSFERASE: 25 IU/L (ref 8–34)
BILIRUBIN TOTAL: 0.2 mg/dl (ref 0.2–1.1)
BUN (UREA NITROGEN): 17 mg/dl (ref 6–20)
CALCIUM: 9.4 mg/dl (ref 8.6–10.3)
CARBON DIOXIDE: 30 mmol/L (ref 22–32)
CHLORIDE: 104 mmol/L (ref 101–111)
CREATININE: 0.7 mg/dl (ref 0.4–1.2)
ESTIMATED GLOMERULAR FILT RATE: >60 mL/min (ref >60)
Glucose Random: 82 mg/dl (ref 74–160)
POTASSIUM: 4.1 mmol/L (ref 3.5–5.1)
SODIUM: 140 mmol/L (ref 135–144)
TOTAL PROTEIN: 6.8 g/dl (ref 5.9–7.5)

## 2010-07-21 LAB — CHG GONADOTROPIN LUTEINIZING HORMONE: LUTEINIZING HORMONE (LH): 37.22 m[IU]/mL

## 2010-07-21 LAB — CHG GONADOTROPIN FOLLICLE STIMULATING HORMONE: FOLLICLE STIMULATING HORMONE: 87.42 m[IU]/mL

## 2010-07-21 LAB — THYROID SCREEN TSH REFLEX FT4: THYROID SCREEN TSH REFLEX FT4: 1.67 u[IU]/mL (ref 0.34–5.60)

## 2010-07-21 MED ORDER — MECLIZINE HCL 12.5 MG PO TABS
12.5000 mg | ORAL_TABLET | Freq: Three times a day (TID) | ORAL | Status: DC | PRN
Start: 2010-07-21 — End: 2012-02-16

## 2010-07-21 MED ORDER — BLACK COHOSH 20 MG PO TABS
1.0000 | ORAL_TABLET | Freq: Two times a day (BID) | ORAL | Status: DC
Start: 2010-07-21 — End: 2011-01-07

## 2010-07-21 MED ORDER — FLUTICASONE PROPIONATE 50 MCG/ACT NA SUSP
2.00 | Freq: Every day | NASAL | Status: AC
Start: 2010-07-21 — End: 2011-01-20

## 2010-07-21 MED ORDER — VITAMIN D 400 UNITS PO CAPS
400.0000 [IU] | ORAL_CAPSULE | Freq: Every day | ORAL | Status: DC
Start: 2010-07-21 — End: 2011-07-21

## 2010-07-21 NOTE — Progress Notes (Signed)
Kayla Garza is a 55 year old female who is new to practice;  Pt has following concerns today      Dizziness  Going on for more than 1 year  Had episode of vision abnormality last year  Was seen by laura neville and referred opthal  Did not go to apt  Vision problems never happened again  Still has dizziness when working but can also happen when seated also  Will feel sensation of vertigo with room spinning  No cp, sob, palpitations  No syncope  Not as strong as in the past  Sx of severe vertigo associated with vomiting last year  Not as bad in past year; will always have associated nausea but no vomiting in the past year  Happens every   Will last for a half day  +allergy sx, sinus and nasal congestion with runny nose all the time  No cough  No wheezing  Non smoker  No fevers  No sinus pain    Hot flashes  Started about 2 years ago  She had hysterectomy for fibroids 15 years ago but ovaries remain  Waking up at night soaking wet    Back pain  Unsure if being related to being over weight  Lower back  Radiates to upper legs  Numbness once only  Pain started after lifting heavy bucket  Pain better for a while and then returns  Takes otc nsaids, asa or tylenol  for the pain and feels this is effective    Ovarian cyst  Found on pelvic US in [place] 2 years ago  Told was very large and should have fu but has not been done  Occasional lower abd pain      Past Medical History    Helicobacter pylori (H. pylori) 2000    Comment: s/p treatment for 2 months    Other and unspecified ovarian cyst 1999    Breast lump     Comment: 1/00 Bx > fibroadenoma         Past Surgical History    CRNEC TREPH B1 FLAP CRX EXC TUM STTL 1980    Comment craniotomy age 25, tumor pituitary    BRST RCNSTJ IMMT/DLYD W/TISS EXPANDER SBSQ XPNSJ 1990    Comment s/p removal of excess skin    TAH +-RMVL TUBE +-RMVL OVARY 1997    Comment surgery for uterine fibroids. 7/01 Pap of  vag cuff Negative         Family History     Osteoporosis Mother     Psychiatric Illness Brother     Comment: depression    Cancer - Breast FamHxNeg     Cancer - Colon FamHxNeg        Social History    Marital Status: Married             Spouse Name:                       Years of Education:                 Number of children: 0             Occupational History  Occupation          Psychiatric nurse  Social History Main Topics    Smoking Status: Never Smoker                      Alcohol Use: No              Drug Use: No              Sexual Activity: Yes               Partners with: Female    Other Topics            Concern  Exercise                Yes    Comment:started 1 year ago    Social History Narrative    Works with Public house manager, lives with husband, feels safe    From [place]    Arrived [place] 1985      ROS  REVIEW OF SYSTEMS:  Otherwise negative, including:  CONSTITUTIONAL: No fatigue, +night sweats, no unexpected change in weight, no trouble sleeping  EYES: No visual disturbances,   ENT: No sinus pain, hearing difficulties, persistent sore throat or dental problems  RESP: No prolonged cough, wheezing, difficulty breathing,   CARDIAC:  No palpitations, chest pain or pressure,   BREASTS: No lumps, pain or discharge  GI: No loss of appetite, nausea or vomiting, trouble swallowing, heartburn, stomach pain, constipation or diarrhea, blood in stool  GU/GYN: No abnormal bleeding, discharge, itching, pelvic pain; No dysuria   MUSCULOSKELETAL: +back pain as above  NEURO: No unusual headache, fainting, falls or loss of feeling. No trouble thinking, remembering, walking, speaking or using arms and legs  +vertigo  ENDO: No excess urination or thirst,   HEME: No unusual bleeding, blood clots, anemia or swollen glands  SKIN: No itching, rashes   PSYCH: No loss of enjoyment, feeling depressed, or excessive worrying;   ALLERG/IMM: +runny nose, nasal congestion, post nasal drip        Exam  BP 120/80   Pulse 83    Temp(Src) 97.6 F (36.4 C) (Temporal)   Ht 5' 0.24" (1.53 m)   Wt 152 lb (68.947 kg)   BMI 29.45 kg/m2   SpO2 99%  Gen: alert, nad  Ears:  TMs intact without perforation or effusion, external canals normal. No significant cerumenosis noted.  Nose: clear nasal discharge  OP: moist, no lesions, no exudates, no erythema, tonsils not enlarged  Sinuses: non tender frontal, maxillary, ethmoid  Neck: supple with no LAD  Lungs:Chest is clear, no wheezing or rales. Normal symmetric air entry throughout both lung fields.   CV: S1 and S2 normal, no murmurs, clicks, gallops or rubs. Regular rate and rhythm.  Neuro: Neuro: CN 2-12 grossly intact, nl strength, nl sensation, nl gait, no dysmetria,  Negative hallpike  Back:tender across lumbar spine, paravertebral muscles, negative straight leg raise, nl sensation bilat LE, nl gait, nl ROM in back      A/P  780.4K Vertigo  (primary encounter diagnosis)  Comment: this may be related to hx of allergies  Start flonase and sx if sx improve  Also rx meclizine to use prn vertigo  I am not able to elicit sx on exam today  Her vision complaint resolved >1 year ago with no further sx so will not eval at this time  Plan: COMPLETE CBC W/AUTO DIFF WBC, COMPREHENSIVE         METABOLIC PANEL, meclizine (ANTIVERT) 12.5 MG  TABS, ROUTINE VENIPUNCTURE, THYROID SCREEN TSH      268.9G Vitamin D deficiency  Comment: renew vitamins  Plan: Cholecalciferol (VITAMIN D) 400 UNIT capsule       V76.2F Screening for malignant neoplasm of breast  Comment:   Plan: ORDER FOR MAMMOGRAM (SCREENING)          V76.51 Special screening for malignant neoplasms, colon  Comment: refer colonoscopy  Plan: REFERRAL TO GASTROENTEROLOGY ( INT)         627.2E Menopausal syndrome (hot flashes)  Comment: trial of black cohosh  Also check LH and FSH  By her sx, sounds like menopause  Last menses were >10 years ago when she had hysterectomy  Plan: Black Cohosh (REMIFEMIN) 20 MG TABS, FOLLICLE         STIMULATING HORMONE  (FSH), LUTEINIZING HORMONE         (LH)      724.5E Back pain  Comment: trial of PT for her back pain  Ok to take otc nsaids for her pain as she has been doing    Plan: REFERRAL TO PHYSICAL THERAPY ( INT)      278.02L Overweight (BMI 25.0-29.9)  Comment: reviewed nl weight, agree with exercise and healthy diet, cutting back on calories    477.9AD Allergic rhinitis  Comment: reviewed use of med  Plan: fluticasone (FLONASE) 50 MCG/ACT nasal spray        620.2S Ovarian cyst  Comment:   Plan: ORDER FOR ULTRASOUND          Fu for PE in march

## 2010-07-23 ENCOUNTER — Other Ambulatory Visit (HOSPITAL_BASED_OUTPATIENT_CLINIC_OR_DEPARTMENT_OTHER): Payer: Self-pay | Admitting: Internal Medicine

## 2010-07-23 LAB — MA SCREENING MAMMO BILATERAL WITH CAD

## 2010-07-24 ENCOUNTER — Telehealth (HOSPITAL_BASED_OUTPATIENT_CLINIC_OR_DEPARTMENT_OTHER): Payer: Self-pay | Admitting: Internal Medicine

## 2010-07-24 DIAGNOSIS — N83209 Unspecified ovarian cyst, unspecified side: Secondary | ICD-10-CM

## 2010-07-24 LAB — US ABDOMEN PELVIS SCROTUM & OR RETROPERITONEUM DOPPLER LIMITED

## 2010-07-24 LAB — US TRANSVAGINAL NON-OB

## 2010-07-24 LAB — US PELVIC NON-PREGNANT

## 2010-07-24 NOTE — Progress Notes (Addendum)
Quick Note:    See call  Refer gyne  ______

## 2010-07-24 NOTE — Telephone Encounter (Signed)
Will ask RN to contact pt with results  Her ultrasound shows a 4 cm complex ovarian cyst. I would recommend gyne evaluation for this  Will place referral  Her labs were fine and do confirm menopause  : 4 cm mildly complex right ovarian cyst. Gynecology  consultation, continued ultrasound follow-up recommended.

## 2010-07-24 NOTE — Progress Notes (Addendum)
Quick Note:    See call to pt  Labs confirm menopause  ______

## 2010-07-24 NOTE — Progress Notes (Addendum)
Quick Note:    See call  Refer gyne  ______

## 2010-07-27 NOTE — Telephone Encounter (Signed)
I have attempted to contact this patient by phone with the following results: left message to return my call on answering machine regarding results

## 2010-07-28 ENCOUNTER — Telehealth (HOSPITAL_BASED_OUTPATIENT_CLINIC_OR_DEPARTMENT_OTHER): Payer: Self-pay | Admitting: Internal Medicine

## 2010-07-28 NOTE — Telephone Encounter (Signed)
Left message gynecology appt scheduled 08/19/10 @230pm  with Dr Eugenie Norrie.  Appt letter mailed.

## 2010-07-30 NOTE — Telephone Encounter (Signed)
I called and left a V/M for patient to return call to the [facility]

## 2010-08-19 ENCOUNTER — Encounter (HOSPITAL_BASED_OUTPATIENT_CLINIC_OR_DEPARTMENT_OTHER): Payer: Self-pay | Admitting: Internal Medicine

## 2010-08-19 ENCOUNTER — Ambulatory Visit (HOSPITAL_BASED_OUTPATIENT_CLINIC_OR_DEPARTMENT_OTHER): Payer: BC Managed Care – HMO | Admitting: Obstetrics & Gynecology

## 2010-08-19 VITALS — BP 156/86 | HR 66 | Ht 63.0 in

## 2010-08-19 DIAGNOSIS — N83209 Unspecified ovarian cyst, unspecified side: Secondary | ICD-10-CM

## 2010-08-19 LAB — CARCINOEMBRYONIC ANTIGEN: CARCINOEMBRYONIC ANTIGEN: 1.3 ng/mL (ref 0.0–3.0)

## 2010-08-19 NOTE — Progress Notes (Addendum)
Addended by: Ollen Gross on: 08/19/2010      Modules accepted: Orders

## 2010-08-19 NOTE — Telephone Encounter (Signed)
Pt aware and seen by gyne

## 2010-08-19 NOTE — Progress Notes (Addendum)
Cc: Consult from Dr. Collene Schlichter for  Ov cyst     HPI: 55 yo MLF G0 presents today for above. In 2000 while in [place] at regular visit pelvic sono was done (stating routine testing) was found to have an ovarian cyst. She gets check on it every time she goes to [place]. She was told it stable last check there was 2 yrs ago. She has pain on the left side worse with activity\ies and exercise. Feels like pressure like pain. Nl urine and bowel habits. No N/V no fever or chills. SHe takes black cohosh for vasomotor sx    Gyn hx: Menopause @1997  (Hx of TAH for fibroids/Menorrhagia in 1997). Sexually active with 1 partner of 20 yrs. No hx of STD or abn paps last was 47yrs ago in [place] per pt, was advised no more paps. Last mammogram 2'12 benign fibroglandular changes advise annual screening    UJW:[id number] Active Problem List:     Overweight (BMI 25.0-29.9) [278.02L]     Vision loss of left eye, momentary [369.8K]     Vitamin D deficiency [268.9G]     Menopausal syndrome (hot flashes) [627.2E]     Back pain [724.5E]     Vertigo [780.4K]     Ovarian cyst [620.2S]     Allergic rhinitis [477.9AD]    PSH: TAH, Breast augmentation    FH: No ca . 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 Disp: 30 capsule Rfl: 11   meclizine (ANTIVERT) 12.5 MG TABS Take 1 tablet by mouth 3 (three) times daily as needed (dizziness). Disp: 30 each Rfl: 1   Black Cohosh (REMIFEMIN) 20 MG TABS Take 1 tablet by mouth 2 (two) times daily. Disp: 60 tablet Rfl: 2   fluticasone (FLONASE) 50 MCG/ACT nasal spray 2 sprays by Each Nostril route daily. Disp: 1 Bottle Rfl: 5   Multiple Vitamin (MULTIVITAMIN CONCENTRATE IV)  Disp:  Rfl:        Pleasant well nourished and developed female in no apparent distress A&Ox3  Abd: SOft obese NT  Pelvic: NL ext genitalia,  nl urethral meatus, bladder, vagina, and perineum. Uterus and cervix surgically absent. NL vaginal cuff, Tenderness on LLQ on bimanual exam.  No adnexal masses noted limited due to body habitus    A/P) 55 yo MLF G0 seen consultation re ov cyst  -Reviewed pelvic sono with pt. Minimally complex 4cm R ov cyst. Treatment options reviewed. Observation vs surgical. Cyst has been there for many yrs. Pain's pain however is on the left side. Counsled about laparoscopy . Benefits, risks and alternatives were discussed. Pt desires to wait and reevluate with repeat sono in 2months  -Will check Tumor marker  -RTC in 2months    All questions and concerns were answered to her satisfaction  Thank you for involving me in the care of this lovely pt    Cc: Dr. Collene Schlichter

## 2010-08-20 LAB — CA 125: CA 125: 8 U/mL (ref 0.0–34.0)

## 2010-09-01 ENCOUNTER — Encounter (HOSPITAL_BASED_OUTPATIENT_CLINIC_OR_DEPARTMENT_OTHER): Payer: Self-pay | Admitting: Internal Medicine

## 2010-09-01 ENCOUNTER — Ambulatory Visit (HOSPITAL_BASED_OUTPATIENT_CLINIC_OR_DEPARTMENT_OTHER): Payer: BC Managed Care – HMO | Admitting: Internal Medicine

## 2010-09-01 VITALS — BP 120/90 | HR 74 | Temp 97.5°F | Ht 63.0 in | Wt 154.0 lb

## 2010-09-01 DIAGNOSIS — Z0000 Encounter for general adult medical examination without abnormal findings: Secondary | ICD-10-CM

## 2010-09-01 DIAGNOSIS — R42 Dizziness and giddiness: Secondary | ICD-10-CM

## 2010-09-01 DIAGNOSIS — J309 Allergic rhinitis, unspecified: Secondary | ICD-10-CM

## 2010-09-01 DIAGNOSIS — N951 Menopausal and female climacteric states: Secondary | ICD-10-CM

## 2010-09-01 DIAGNOSIS — E663 Overweight: Secondary | ICD-10-CM

## 2010-09-01 DIAGNOSIS — E559 Vitamin D deficiency, unspecified: Secondary | ICD-10-CM

## 2010-09-01 DIAGNOSIS — IMO0002 Reserved for concepts with insufficient information to code with codable children: Secondary | ICD-10-CM

## 2010-09-01 NOTE — Progress Notes (Signed)
Chief Complaint:  Kayla Garza is a 55 year old female who presents for a physical exam.      Was having LLQ pain every time she exercised  Found to have right ovarian cyst of 4 cm  Told by Dr Eugenie Norrie to dc exercise and see if pain resolved  She has stopped exercise and pain is better  She has fu with him next month along with repeat US to look at cyst again  Most Recent Weight Reading(s)  09/01/10 : 154 lb (69.854 kg)  07/21/10 : 152 lb (68.947 kg)  08/20/09 : 148 lb (67.132 kg)  12/05/07 : 149 lb (67.586 kg)  10/30/07 : 149 lb (67.586 kg)  She asks for nutrition apt to discuss nl weight    Vertigo  Resolved after a few doses of meclizine    Allergies  Resolved with flonase  No more runny nose, sinus pain, ear pain, face pain          Patient Active Problem List:     Overweight (BMI 25.0-29.9) [278.02L]     Vision loss of left eye, 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 Disp: 30 capsule Rfl: 11   meclizine (ANTIVERT) 12.5 MG TABS Take 1 tablet by mouth 3 (three) times daily as needed (dizziness). Disp: 30 each Rfl: 1   Black Cohosh (REMIFEMIN) 20 MG TABS Take 1 tablet by mouth 2 (two) times daily. Disp: 60 tablet Rfl: 2   fluticasone (FLONASE) 50 MCG/ACT nasal spray 2 sprays by Each Nostril route daily. Disp: 1 Bottle Rfl: 5   Multiple Vitamin (MULTIVITAMIN CONCENTRATE IV)  Disp:  Rfl:          Allergies:  Review of Patient's Allergies indicates:  No Known Allergies    Health Maintenance:  HEP B HIGH RISK VACCINE EVAL (ONCE) due on 03/29/1976  PHYSICAL EXAM (AGE 87+) due on 08/21/2010  FLU VACCINE SEASONAL due on 02/20/2011  FECAL OCCULT BLOOD AGE 87+( 3 CARDS) due on 06/18/2011  MAMMOGRAPHY( YEARLY) due on  07/24/2011  LIPID SCREENING due on 08/21/2014  TETANUS (16 AND OVER) due on 10/29/2017  PAP SMEAR Completed  HIV SCREENING Completed    Immunizations:    Immunization History  Administered            Date(s) Administered    TD                    06/21/1997  10/30/2007      Histories:    Past Medical History    Helicobacter pylori (H. pylori) 2000    Comment: s/p treatment for 2 months    Other and unspecified ovarian cyst 1999    Breast lump     Comment: 1/00 Bx > fibroadenoma           Past Surgical History    CRNEC TREPH B1 FLAP CRX EXC TUM STTL 1980    Comment craniotomy age 59, tumor pituitary    BRST RCNSTJ IMMT/DLYD W/TISS EXPANDER SBSQ XPNSJ 1990    Comment s/p removal of excess skin    HYSTERECTOMY 1997    Comment surgery  for uterine fibroids. 7/01 Pap of  vag cuff Negative; ovaries remain         Social History   Marital Status: Married  Spouse Name: N/A    Years of Education: N/A  Number of Children: 0     Occupational History         Social History Main Topics   Smoking status: Never Smoker     Smokeless tobacco:     Alcohol Use: No    Drug Use: No    Sexually Active: Yes  Partner(s): Female     Other Topics Concern    Exercise Yes    Comment: started 1 year ago     Social History Narrative    Works with Public house manager, lives with husband, feels safe    From [place]    Arrived [place] 1985       Family History    Osteoporosis Mother     Psychiatric Illness Brother     Comment: depression    Cancer - Breast FamHxNeg     Cancer - Colon FamHxNeg          Review of Systems:                   Skin: negative  Eyes: negative  Ears/Nose/Throat: allergies  Respiratory: negative  Cardiovascular: negative  Gastrointestinal: negative  Genitourinary: negative  Gyn:no breast pain or new or enlarging lumps on self exam  no vaginal bleeding  +ovarian cyst on right  Musculoskeletal: negative  Neurologic: negative  Endocrine: negative  Psychiatric: negative  Hematologic/Lymphatic/Immunologic: negative    Physical:  BP 120/90   Pulse 74    Temp(Src) 97.5 F (36.4 C) (Temporal)   Ht 5\' 3"  (1.6 m)   Wt 154 lb (69.854 kg)   BMI 27.28 kg/m2   SpO2 99%  General appearance: healthy, alert, well developed, well nourished  Eyes: conjunctivae/corneas clear. PERRL, EOM's intact.   Skin: skin color, texture, turgor are normal  Head: Normocephalic. No masses, lesions, tenderness or abnormalities  Ears: External ears normal. Canals clear. TM's normal.  Nose/Sinuses: Nares normal. Septum midline. Mucosa normal. No drainage or sinus tenderness.  Oropharynx: Lips, mucosa, and tongue normal. Teeth and gums normal. Oropharynx moist and without lesion  Neck: Neck supple. No adenopathy. Thyroid symmetric, normal size, and without nodularity  Back: Back symmetric, no curvature.  No CVA tenderness.  Lungs:  Good diaphragmatic excursion. Lungs clear to auscultation bilaterally  Heart: PMI normal. No lifts, heaves, or thrills. RRR. No murmurs, clicks, gallops or rubs  Breast: breasts symmetric, no dominant or suspicious mass, no skin or nipple changes, no axillary adenopathy and self exam in taught and encouraged  Sp breast reduction  Abdomen: Abdomen soft, non-tender. BS normal. No masses, no organomegaly  Extremities: Extremities normal. No deformities, edema, or skin discoloration  Musculoskeletal: Muscular strength intact.  Peripheral pulses: radial=4/4,  dorsalis pedis=4/4  Neuro: Gait normal.  Sensation grossly normal   Pelvic:not indicated      Health Counseling:  Smoking:  Reviewed and Discussed  Substance Use Issues:   Reviewed and Discussed   Diet, Exercise, Wt. Control:  Reviewed and Discussed  Dental Health:  Reviewed and Discussed  Vision Health:  Reviewed and Discussed  Mental Health:  Reviewed and Discussed  Domestic Violence:  Reviewed and Discussed  Sexual Health:  Reviewed and Discussed  BSE:  Reviewed and Discussed  Osteoperosis: Reviewed and Discussed        ASSESSMENT/PLAN:  V70.0 Routine general medical  examination at a health care facility  (primary  encounter diagnosis)  Comment: PE done  Pap not indicated  Recent mammo normal    278.02L Overweight (BMI 25.0-29.9)  Comment: refer nutrition  Try to resume walking  Plan: REFERRAL TO NUTRITION ( INT) NON-DIABETES      268.9G Vitamin D deficiency  Comment: continue supplements    780.4K Vertigo  Comment: resolved with meclizine    477.9AD Allergic rhinitis  Comment: improved with flonase    627.2E Menopausal syndrome (hot flashes)  Comment: improved with black cohosh  continue    I have reviewed the past medical, surgical, social and family history and updated these sections of EpicCare as relevant. All interim labs, test results, and consult notes were reviewed and discussed with Kayla Garza. Medications were reconciled during this visit and a current medication list was given to the patient at the end of the visit.

## 2010-09-01 NOTE — Patient Instructions (Signed)
[facility]  [facility]  [address], [address]  [place] [address]  Clinical Nutrition Services    Calcium and Your Health    Calcium is a mineral that helps build strong bones and teeth, helps prevent brittle bones (osteoporosis) and hip fractures, and helps maintain a normal blood pressure. It is very important to get enough calcium each day.    * CALCIUM NEEDS AT ALL AGES: (RDI 2000)  Infants birth to 12 months  is 210 -270 mg/day          Child 1-8 years  is 500-.800 mg/day                     Adolescent 9-18years is 1300 mg/day                   Adults 19-30 years is 1000 mg/day  Adults 31-50 years is 1000 mg/day  Adults 51 and older is 1200 mg/day    * TO MAINTAIN GOOD BONE HEALTH, EAT A CALCIUM RICH DIET:    FOODS                                      Yogurt, plain, nonfat: 1 cup serving is 450 mgs of calcium    Ricotta cheese, part skim: 1/2 cup serving is 340 mgs of calcium    Milk, skim: 1 cup serving is 300 mgs of calcium    Milk, whole: 1 cup serving is 290 mgs of calcium    Sardines, canned, with bones: 3 oz serving is 280 mgs of calcium    Yogurt, plain, whole milk:1 cup serving is 275 mgs of calcium    Orange juice, calcium fortified:1 cup serving is 270 mgs of calcium    Swiss cheese: 1 oz serving is 270 mgs of calcium    Spinach, cooked: 1 cup serving is 240 mgs of calcium   Turnip greens, rhubarb, cooked:1 cup serving is 200 mgs of calcium   Salmon, canned, with bones: 3 oz serving is 180 mgs of calcium   Ice Cream: 1 cup serving is 175 mgs of calcium   Pudding, instant mix: 1/2 cup serving is 150 mgs of calcium   Almonds:  /2 cup serving is 150 mgs of calcium   Kale, cooked: 1 cup serving is 100 mgs of calcium   Lobster, cooked: 6 oz serving is 100 mgs of calcium   Tofu, with calcium sulfate:  3oz serving is 100 mgs of calcium     EXERCISE: Weight bearing exercises, such as walking, jogging, racquet sports, aerobics, and weight training  help make bones stronger.      VITAMIN D: Vitamin D is essential to help your body absorb calcium. A healthy body can make its own vitamin D with the help of sunlight. But in the winter months, you may not get enough sun, and should make   sure you get it from another source, like milk or a multivitamin.     HORMONES: The hormone estrogen helps the female body and bones to use calcium. After menopause women need to discuss hormone treatment with their doctor.    * This is just a first step in helping improve your health.  * For help with a meal plan for you, make an appointment with the Nutritionist.  * For more information, you can also contact the National Nutrition Hotline   1-800-366-1655                                                              9/95 MAS 1/96, 12/97eqj, 01/02, 4/02

## 2010-10-08 ENCOUNTER — Telehealth (HOSPITAL_BASED_OUTPATIENT_CLINIC_OR_DEPARTMENT_OTHER): Payer: Self-pay | Admitting: Internal Medicine

## 2010-10-08 NOTE — Telephone Encounter (Signed)
Kc spoke with husband and gave him the message to have PT reschedule appt

## 2010-10-09 ENCOUNTER — Telehealth (HOSPITAL_BASED_OUTPATIENT_CLINIC_OR_DEPARTMENT_OTHER): Payer: Self-pay | Admitting: Ambulatory Care

## 2010-10-09 ENCOUNTER — Encounter (HOSPITAL_BASED_OUTPATIENT_CLINIC_OR_DEPARTMENT_OTHER): Payer: Self-pay | Admitting: Family Medicine

## 2010-10-09 ENCOUNTER — Ambulatory Visit (HOSPITAL_BASED_OUTPATIENT_CLINIC_OR_DEPARTMENT_OTHER): Payer: BC Managed Care – HMO | Admitting: Family Medicine

## 2010-10-09 VITALS — BP 100/72 | HR 77 | Temp 97.8°F | Wt 155.0 lb

## 2010-10-09 DIAGNOSIS — H579 Unspecified disorder of eye and adnexa: Secondary | ICD-10-CM

## 2010-10-09 MED ORDER — OLOPATADINE HCL 0.1 % OP SOLN
1.00 [drp] | Freq: Two times a day (BID) | OPHTHALMIC | Status: AC
Start: 2010-10-09 — End: 2010-10-16

## 2010-10-09 NOTE — Telephone Encounter (Signed)
Message copied by Shan Levans on Fri Oct 09, 2010  3:38 PM  ------       Message from: Judye Bos       Created: Fri Oct 09, 2010  3:18 PM       Regarding: pt walking her eyes are itching and hurting        Contact: [phone]         [facility]              Kayla Garza [id number], 55 year old, female, Telephone Information:       Home Phone      [phone]       Work Phone      Not on file.       Mobile          [phone]                     Cleotis Lema NUMBER: [phone]       Best time to call back:        Cell phone:        Other phone:              Available times:              Patient's language of care: [place]              Patient needs a [name].              Patient's PCP: Duane Lope              Person calling on behalf of patient: Patient (self)              Calls today pt just walking her left eyes bother her if feels itching and it hurts.              Patient's Preferred [facility]acy:        [facility]              Phone: [phone] Fax: [phone]

## 2010-10-09 NOTE — Progress Notes (Signed)
Kayla Garza is a 55 year old female  CC:   Patient presents with:    Eye Problem - left eye, itchiness and redness      V41.1E Eye problem  (primary encounter diagnosis)  Patient c/o possible conjunctivitis.  Patient has had itching, burning, redness of the L eye for 3 days  Red flag symptoms:  Patient has blurred vision but no photophobia, has no discharge but does have itching  She has been rubbing the eye  There has been no trauma to the eye recently, sick contacts at home/work/school.  No recent URI sx  Denies history of allergic rhinitis even though PCP has this listed on her problem list  No sick contacts    BP 100/72   Pulse 77   Temp(Src) 97.8 F (36.6 C) (Temporal)   Wt 155 lb (70.308 kg)   SpO2 100%  Pain Score: 7 (7/10)          Physical Exam   Eyes: Lids are normal. Lids are everted and swept, no foreign bodies found. Right eye exhibits no chemosis, no discharge, no exudate and no hordeolum. No foreign body present in the right eye. Left eye exhibits no chemosis, no discharge, no exudate and no hordeolum. No foreign body present in the left eye. Right conjunctiva is not injected. Right conjunctiva has no hemorrhage. Left conjunctiva is not injected. Left conjunctiva has no hemorrhage. No scleral icterus. Right eye exhibits normal extraocular motion and no nystagmus. Left eye exhibits normal extraocular motion and no nystagmus. Right pupil is round and reactive. Left pupil is round and reactive. Pupils are equal.       Estimated Body mass index is 27.46 kg/(m^2) as calculated from the following:    Height as of 09/01/10: 5\' 3" (1.6 m).    Weight as of this encounter: 155 lb(70.308 kg).    V41.1E Eye problem  (primary encounter diagnosis)  Comment: I could not find anything obviously abnormal with either eye.  It sounds like this could just be allergic conjunctivitis with irrigation and pain from rubbing.  DDx includes uveitis, iritis, keratitis, but nothing on exam or history to really suggest  this right now  Plan: olopatadine (PATANOL) 0.1 % ophthalmic solution        Will try this first, but I gave her a low threshold to consider going to ER if sx worsen or if she has any vision problems or photophobia.      I have spent 25 minutes in face to face time with this patient/patient proxy of which > 50% was in counseling or coordination of care regarding above issues/Dx.      I have reviewed the past medical, surgical, social and family history and updated these sections of EpicCare as relevant. All interim labs, test results, and consult notes were reviewed and discussed with Kayla Garza or their legal guardian/representative. Medications were reconciled during this visit and a current medication list was given to the patient at the end of the visit.

## 2010-10-09 NOTE — Telephone Encounter (Signed)
Message copied by Ciara Kagan on Fri Oct 09, 2010  3:38 PM  ------       Message from: RODRIGUEZ, BETSIE       Created: Fri Oct 09, 2010  3:18 PM       Regarding: pt walking her eyes are itching and hurting        Contact: [phone]         [facility]              Kayla Garza [id number], 54 year old, female, Telephone Information:       Home Phone      [phone]       Work Phone      Not on file.       Mobile          [phone]                     CALL BACK NUMBER: [phone]       Best time to call back:        Cell phone:        Other phone:              Available times:              Patient's language of care: Portuguese              Patient needs a Portuguese interpreter.              Patient's PCP: Bonnie Engelbart,MD              Person calling on behalf of patient: Patient (self)              Calls today pt just walking her left eyes bother her if feels itching and it hurts.              Patient's Preferred Pharmacy:        [facility]              Phone: [phone] Fax: [phone]

## 2010-10-09 NOTE — Telephone Encounter (Signed)
Discussed with Dr. Annye Rusk, will see pt, appt scheduled.

## 2010-10-21 ENCOUNTER — Ambulatory Visit (HOSPITAL_BASED_OUTPATIENT_CLINIC_OR_DEPARTMENT_OTHER): Payer: Self-pay | Admitting: Obstetrics & Gynecology

## 2010-10-22 LAB — US ABDOMEN PELVIS SCROTUM & OR RETROPERITONEUM DOPPLER LIMITED

## 2010-10-22 LAB — US PELVIC NON-PREGNANT

## 2010-10-22 LAB — US TRANSVAGINAL NON-OB

## 2010-10-23 ENCOUNTER — Ambulatory Visit (HOSPITAL_BASED_OUTPATIENT_CLINIC_OR_DEPARTMENT_OTHER): Payer: BC Managed Care – HMO | Admitting: Obstetrics & Gynecology

## 2010-10-23 ENCOUNTER — Ambulatory Visit (HOSPITAL_BASED_OUTPATIENT_CLINIC_OR_DEPARTMENT_OTHER): Payer: BC Managed Care – HMO | Admitting: Registered"

## 2010-10-23 VITALS — BP 145/86 | HR 63 | Wt 150.0 lb

## 2010-10-23 DIAGNOSIS — R93429 Abnormal radiologic findings on diagnostic imaging of unspecified kidney: Secondary | ICD-10-CM

## 2010-10-23 DIAGNOSIS — N83299 Other ovarian cyst, unspecified side: Secondary | ICD-10-CM

## 2010-10-23 NOTE — Progress Notes (Signed)
Pt returns today for f/u on pelvic sono for known R complex ov cyst. She had neg tumor markers (ca 125 and CEA) last visit. Sono showed 4cm complex R ov cyst. Pt with hx of prev hysterectomy in late 90's for fibroids    Pleasant well nourished and developed female in no apparent distress A&Ox3    A/P) F/u complex ov cyst  -Reviewed pelvic sono with pt, stable complex L ov cyst 4.4cm( 4cm prev sono), and new 1.7cm complex R ov cyst. Its reassuring that ca 125 is negative. However at her age she udnerstands that she should n't be making any new cysts. I recommend removal of ovaries via laparoscopy. Benefits, risks and alternatives were discussed. Pt agrees, will obatian a follow up sono in preparation for surgery  -? Abnormal appearing L kidney on pelvic sono, will check renal sono as recommended and ref to nephrology  -RTC for preop    All questions and concerns were answered to her satisfaction  Total time spent 20 min > 50% in direct counseling

## 2010-10-27 ENCOUNTER — Telehealth (HOSPITAL_BASED_OUTPATIENT_CLINIC_OR_DEPARTMENT_OTHER): Payer: Self-pay | Admitting: Registered Nurse

## 2010-10-27 ENCOUNTER — Ambulatory Visit (HOSPITAL_BASED_OUTPATIENT_CLINIC_OR_DEPARTMENT_OTHER): Payer: BC Managed Care – HMO | Admitting: Gastroenterology

## 2010-10-27 VITALS — BP 130/80 | HR 71 | Wt 153.0 lb

## 2010-10-27 DIAGNOSIS — Z1211 Encounter for screening for malignant neoplasm of colon: Secondary | ICD-10-CM

## 2010-10-27 DIAGNOSIS — K5900 Constipation, unspecified: Secondary | ICD-10-CM

## 2010-10-27 MED ORDER — PEG 3350-KCL-NABCB-NACL-NASULF 240 G PO SOLR
4000.0000 mL | ORAL | Status: DC
Start: 2010-10-27 — End: 2011-01-07

## 2010-10-27 NOTE — Telephone Encounter (Signed)
Spoke with pt's husband via [place] interpreter, [place].  He wanted to know about phone call re: 02/08/11 appt with Medical Specialties. This is for follow up of abnormal finding on renal U/S.  He wants to know when ovarian cyst surgery will be scheduled.   Forward to [employer].

## 2010-10-27 NOTE — Progress Notes (Signed)
Primary note dictated through E-Scription.  Note filed in Chart Review under the "Other" Tab, as a Medical Specialties Note.  Patient understands and agrees with the plan

## 2010-10-27 NOTE — Progress Notes (Addendum)
Educated pt on colonoscopy and prep with [employer]. Instructions given to pt both verbal and written in their language. Pt verbalized understanding.  Discussed medications and adjustments.  Denies any safety concerns.  No falling episodes.

## 2010-10-27 NOTE — Telephone Encounter (Signed)
Message copied by Thea Gist on Tue Oct 27, 2010 11:49 AM  ------       Message from: Tamera Punt       Created: Tue Oct 27, 2010  9:21 AM       Regarding: Has question regarding wife's surgery       Contact: [phone]         [facility]              [place] Gle[place] Takaki [id number], 55 year old, female, Telephone Information:       Home Phone      [phone]       Work Phone      Not on file.       Mobile          [phone]                     CALL BACK NUMBER: [phone]       Best time to call back: anytime                     Available times:              Patient's language of care: [place]              Needs [name]                Patient's PCP: Duane Lope              Person calling on behalf of patient: Husband   [phone] or [phone]              Calls today Has questions regarding wife's surgery              Patient's Preferred [facility]acy:        [facility]              Phone: [phone] Fax: [phone]

## 2010-11-10 ENCOUNTER — Ambulatory Visit (HOSPITAL_BASED_OUTPATIENT_CLINIC_OR_DEPARTMENT_OTHER): Admit: 2010-11-10 | Payer: Self-pay | Source: Ambulatory Visit | Admitting: Gastroenterology

## 2010-11-10 ENCOUNTER — Telehealth (HOSPITAL_BASED_OUTPATIENT_CLINIC_OR_DEPARTMENT_OTHER): Payer: Self-pay | Admitting: Registered Nurse

## 2010-11-10 NOTE — Telephone Encounter (Signed)
2nd call from pt with question about " when will her surgery be done?".  She has been waiting for call.  Forward to [employer] and Dr Eugenie Norrie. Nadeen Landau Herminio Commons, RN, 11/10/2010, 2:03 PM

## 2010-11-10 NOTE — Telephone Encounter (Signed)
Message copied by Thea Gist on Tue Nov 10, 2010  1:50 PM  ------       Message from: Sharyne Richters       Created: Tue Nov 10, 2010 12:35 PM       Regarding: QUESTIONS OB APPTS         [facility] Da Karrisa Didio [phone], 55 year old, female        Patient's PCP: Duane Lope       Person calling on behalf of patient: Spouse              Calls today regarding:        QUESTIONS OB APPTS              Letter type:               Telephone Information:       Home Phone      [phone]       Work Phone      Not on file.       Mobile          [phone]              Best number to call back: Other [phone]  [place] to leave voice message? no              Patient's language of care: [place]              Patient requires interpreter? NO              Patient's preferred pharmacy: [facility]              Phone: [phone] Fax: [phone]

## 2010-11-12 NOTE — Telephone Encounter (Signed)
Kayla Garza will be calling patient with date and time. Pt needs medical clearance  Thank you

## 2010-11-17 ENCOUNTER — Ambulatory Visit (HOSPITAL_BASED_OUTPATIENT_CLINIC_OR_DEPARTMENT_OTHER)
Admit: 2010-11-17 | Disposition: A | Discharge status: Home / Self Care | Payer: Self-pay | Source: Ambulatory Visit | Admitting: Gastroenterology

## 2010-11-17 LAB — GI OPERATIVE NOTE

## 2010-11-17 MED ORDER — SURGILUBE EX GEL
CUTANEOUS | Status: AC
Start: 2010-11-17 — End: 2010-11-18
  Filled 2010-11-17: qty 60

## 2010-11-17 MED ORDER — FENTANYL CITRATE 0.05 MG/ML IJ SOLN
INTRAMUSCULAR | Status: AC
Start: 2010-11-17 — End: 2010-11-17
  Administered 2010-11-17: 100 ug via INTRAVENOUS
  Filled 2010-11-17: qty 4

## 2010-11-17 MED ORDER — LACTATED RINGERS IV SOLN
INTRAVENOUS | Status: AC
Start: 2010-11-17 — End: 2010-11-18
  Administered 2010-11-17: 300 mL
  Filled 2010-11-17: qty 1000

## 2010-11-17 MED ORDER — MIDAZOLAM HCL 2 MG/2ML IJ SOLN
INTRAMUSCULAR | Status: AC
Start: 2010-11-17 — End: 2010-11-17
  Administered 2010-11-17: 6 mg via INTRAVENOUS
  Filled 2010-11-17: qty 8

## 2010-12-19 NOTE — Progress Notes (Signed)
Date of Service: 10/27/2010    I have been asked by Dr [name] to evaluate the patient for constipation and need for colonoscopy.    HISTORY OF PRESENT ILLNESS:  A very lovely 55 year old female, seen with assistance of over-the-phone [place] medical interpreter, who has never had a screening colonoscopy in the past.  She denies any abdominal pain, nausea, or vomiting.  She has not been having rectal bleeding but does report chronic constipation for many years with bowel movement every 2-3 days with straining but without bleeding.  She had used suppository in the past, which is over-the-counter, and she feels that it does work for her when she uses it, although she does not use it that frequently.  She denies any change in caliber on her stools.  She has had prior abdominal surgery including a total abdominal hysterectomy, and several skin grafts were taken from her abdomen.    PAST MEDICAL HISTORY:  Menopause, back pain, vertigo, ovarian cyst, allergic rhinitis, vision loss in the left eye that is momentary, vitamin D deficiency, overweight.    ALLERGIES:  No known drug allergies.    SOCIAL HISTORY:  No tobacco, no alcohol.    FAMILY HISTORY:  No GI diseases or malignancies, including polyps and liver disease.    CURRENT MEDICATIONS:  Include vitamin D, black cohosh, Flonase, multivitamin daily.    REVIEW OF SYSTEMS:  The patient denies any chest pain, palpitations, any shortness of breath, any sneezing, coughing, runny nose, orthopnea or dyspnea on exertion.  No arthralgias or arthritis.  No skin, hair, or nail changes.  No history of jaundice.  No night sweats.  No heat or cold intolerance.  Does have some hot flashes with her menopause.  No weakness, numbness, loss of consciousness.  Not anxious or depressed.  No skin, hair, or nail changes.  No problems with bleeding or bruising.  No polyuria, polydipsia, or hematuria.  Otherwise, unrevealing.    PHYSICAL EXAMINATION:  VITAL SIGNS:  Blood pressure  130/80, pulse 71, oxygen saturation 100% on room air, weight 153 pounds.  GENERAL:  No acute distress, comfortable.  HEENT:  Moist mucous membranes, no scleral icterus.  NECK:  Supple without lymphadenopathy.  CARDIOVASCULAR:  Regular rate and rhythm.  LUNGS:  Clear to auscultation bilaterally.  ABDOMEN:  Soft, nontender, nondistended, normoactive bowel sounds, scar from prior skin grafting visualized and well-healed.  EXTREMITIES:  Warm without edema.  SKIN:  No obvious rashes.  NEUROLOGIC:  Grossly intact.  PSYCHIATRIC:  Alert and oriented, appropriate x3.    LABORATORY VALUES:  From January 2012 demonstrate hematocrit 36.1 with an MCV of 87; platelet count of 250,000.  Her CEA in February 2012 was 1.3.  Liver function profile in January 2011 was entirely normal.    ASSESSMENT AND PLAN:  A very lovely 55 year old Portuguese-speaking female with chronic constipation and need for routine screening colonoscopy.  I feel that patient is an appropriate candidate for moderate sedation.  The procedure, risks, benefits, and alternatives including sedation have been discussed with the patient, and consent has been signed.  I feel that she is an appropriate candidate for moderate sedation.    In terms of constipation, I have recommended that patient try Citrucel on a twice daily basis, and this has been written out for her.    All questions have been answered.    ___________________________  Reviewed and Electronically Signed By: Lelon Huh MD  Sig Date: 12/19/2010  Sig Time: 14:37:06  Dictated By: Lelon Huh MD  Dict Date: 10/27/2010 Dict Time: 04 33 PM    Dictation Date and Time:10/27/2010 16:33:36  Transcription Date and Time:10/27/2010 20:01:04  eScription Dictation id: [id number] Confirmation # :[id number]

## 2011-01-04 ENCOUNTER — Ambulatory Visit (HOSPITAL_BASED_OUTPATIENT_CLINIC_OR_DEPARTMENT_OTHER): Payer: BC Managed Care – HMO | Admitting: Nephrology

## 2011-01-04 VITALS — BP 110/70 | HR 68

## 2011-01-04 DIAGNOSIS — N83202 Unspecified ovarian cyst, left side: Secondary | ICD-10-CM

## 2011-01-04 DIAGNOSIS — N83201 Unspecified ovarian cyst, right side: Secondary | ICD-10-CM

## 2011-01-04 LAB — URINE DIP (POINT OF CARE)
BILIRUBIN, URINE: NEGATIVE
GLUCOSE, URINE: NEGATIVE mg/dl
LEUKOCYTE ESTERASE: NEGATIVE
NITRITE, URINE: NEGATIVE
OCCULT BLOOD, URINE: NEGATIVE
PH URINE: 5.5 (ref 5.0–8.0)
PROTEIN, URINE: NEGATIVE mg/dl (ref 0–15)
SPECIFIC GRAVITY URINE: 1.025 (ref 1.003–1.030)
UROBILINOGEN URINE: 0.2 mg/dl (ref 0.2–1.0)

## 2011-01-04 NOTE — Progress Notes (Signed)
CC ref to me for ?renal mass on pelvic ultrasound    HPI 55 yo fem relatively healthy with normal renal fct who had pelvic ultrasound for ovarian cysts and was found to have a possible renal abnormality ?mass. She denies h/o HTN/DM. No prior kidney problems. No hematuria/dysuria. No frequent/early UTI. No kidney stones. Doing well overall. No acute complaints.     ROS no cp/sob/le edema no wt loss has gained wt due to eating not so active no n/v/d otherwise 12 systems reviewed and neg    PMH reviewed in epic    Allergies nkda    Meds reviewed in epic   Denies regular NSAIDs use    Fam hx denies kidney disease. 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 Range 07/21/2010 14:52   WHITE BLOOD CELL Latest Range: 4.0-10.8 TH/uL 5.3   RED BLOOD CELL COUNT Latest Range: 4.50-5.10 M/uL 4.15 (L)   HEMOGLOBIN Latest Range: 12.0-16.0 g/dl 09.8   HEMATOCRIT Latest Range: 36.0-48.0 % 36.1   MEAN CORPUSCULAR HGB Latest Range: 27.0-33.0 pg 29.4   MEAN CORP HGB CONC Latest Range: 32.0-36.0 g/dl 11.9   MEAN CORPUSCULAR VOL Latest Range: 80.0-100.0 fl 87.0   MEAN PLATELET VOLUME Latest Range: 6.4-10.8 fl 9.1   RBC DISTRIBUTION WIDTH Latest Range: 11.5-14.3 % 14.6 (H)   EOSINOPHIL % Latest Range: 0.0-7.0 % 1.0   LYMPHOCYTE % Latest Range: 13.0-39.0 % 29.7   MONOCYTE % Latest Range: 1.0-12.0 % 7.4    NEUTROPHIL % Latest Range: 46.0-79.0 % 61.5   BASOPHIL % Latest Range: 0.0-2.0 % 0.4   PLATELET COUNT Latest Range: 150-400 TH/uL 250   SODIUM Latest Range: 135-144 mmol/L 140   POTASSIUM Latest Range: 3.5-5.1 mmol/L 4.1   CHLORIDE Latest Range: 101-111 mmol/L 104   CARBON DIOXIDE Latest Range: 22-32 mmol/L 30   ANION GAP Latest Range: 2-25 mmol/L 6   BLOOD UREA NITROGEN Latest Range: 6-20 mg/dl 17   CREATININE Latest Range: 0.4-1.2 mg/dl 0.7   ESTIMATED GLOMERULAR FILT RATE Latest Range: Low: > 60 ML/MIN > 60   Glucose Random Latest Range: 74-160 mg/dl 82   CALCIUM Latest Range: 8.6-10.3 mg/dl 9.4   BILIRUBIN TOTAL Latest Range: 0.2-1.1 mg/dl 0.2   ASPARTATE AMINOTRANSFERAS Latest Range: 8-34 IU/L 25   ALANINE AMINOTRANSFERASE Latest Range: 7-35 IU/L 23   ALKALINE PHOSPHATASE Latest Range: 25-106 IU/L 64   TOTAL PROTEIN Latest Range: 5.9-7.5 g/dl 6.8   ALBUMIN Latest Range: 3.4-4.8 g/dl 3.9       Feb 12 - Ultrasound pelvic  Stable septated right ovarian cyst versus lymphocele or hydrosalpinx.    New 1.7 cm septated left ovarian cyst.    Continued sonographic follow-up of the ovarian findings in 4 to 6  months is recommended.  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        A/P: 55 yo fem relatively healthy with normal renal fct who had pelvic ultrasound for ovarian cysts and was found to have a possible renal abnormality ?mass.     1. Question renal mass or not on left. She needs a renal ultrasound which has already been scheduled for her for this week. Upon evaluation of renal ultrasound I might be able to make a more educated statement about whether she does or does not have a renal abnormality (cyts mass etc.) Until then it is difficult to say anything. She has normal renal fct without proteinuria or hematuria.     2. BP is great she does not have HTN    3. Ovarian cysts f/u with GYN.     RTC 3 mo. If renal  ultrasound is normal she could follow up as needed in the future not on regular basis.

## 2011-01-06 ENCOUNTER — Ambulatory Visit (HOSPITAL_BASED_OUTPATIENT_CLINIC_OR_DEPARTMENT_OTHER): Payer: Self-pay | Admitting: Obstetrics & Gynecology

## 2011-01-07 ENCOUNTER — Ambulatory Visit (HOSPITAL_BASED_OUTPATIENT_CLINIC_OR_DEPARTMENT_OTHER): Payer: BC Managed Care – HMO

## 2011-01-07 ENCOUNTER — Encounter (HOSPITAL_BASED_OUTPATIENT_CLINIC_OR_DEPARTMENT_OTHER): Payer: Self-pay

## 2011-01-07 VITALS — BP 128/78 | HR 66 | Temp 97.3°F | Wt 152.0 lb

## 2011-01-07 DIAGNOSIS — Z01818 Encounter for other preprocedural examination: Secondary | ICD-10-CM

## 2011-01-07 LAB — US RENAL

## 2011-01-07 LAB — US 3D RENDERING WO DEDICATED WORKSTATION

## 2011-01-07 NOTE — Progress Notes (Signed)
Kayla Garza was evaluated on 01/07/11 for a pre-operative exam for a b/ oopherectomy scheduled on 01/14/11.    She has a history of   Patient Active Problem List    Menopausal syndrome (hot flashes) [627.2E]         Date Noted: 07/21/2010      Back pain [724.5E]         Date Noted: 07/21/2010      Vertigo [780.4K]         Date Noted: 07/21/2010      Ovarian cyst [620.2S]         Date Noted: 07/21/2010            Reviewed pelvic sono with pt. Minimally complex R            ov cyst. Treatment options reviewed. Observation            vs surgical. Cyst has been there for many yrs.            Pain's pain however is on the left side. Counsled            about laparoscopy . Benefits, risks and            alternatives were discussed. 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 7/01 Pap of  vag cuff Negative; ovaries remain           Her medications are    Current outpatient prescriptions:  polyethylene glycol (COLYTE WITH FLAVOR PACKS) 240 GM solution Take 4,000 mLs by mouth See Admin Instructions. Disp: 4000 mL Rfl: 0   Cholecalciferol (VITAMIN D) 400 UNIT capsule Take 400 Units by mouth daily. Disp: 30 capsule Rfl: 11   Black Cohosh (REMIFEMIN) 20  MG TABS Take 1 tablet by mouth 2 (two) times daily. Disp: 60 tablet Rfl: 2   fluticasone (FLONASE) 50 MCG/ACT nasal spray 2 sprays by Each Nostril route daily. Disp: 1 Bottle Rfl: 5   Multiple Vitamin (MULTIVITAMIN CONCENTRATE IV)  Disp:  Rfl:            She denies chest pain, shortness of  breath, orthopnea, paroxysmal nocturnal dyspnea and lower extremity edema.  Her functional capacity is excellent.     Her physical exam was as follows:    Review of Patient's Allergies indicates:  No Known Allergies      OBJECTIVE:     01/07/11  1417   BP: 128/78   Pulse: 66   Temp: 97.3 F (36.3 C)   TempSrc: Oral   Weight: 152 lb (68.947 kg)   SpO2: 97%         PHYSICAL EXAM:     Constitutional: Well developed, Well nourished, No acute distress, Non-toxic appearance.     HEENT: no JVD  Cardiovascular: Normal heart rate, Normal rhythm, No murmurs, No rubs, No gallops.   Thorax & Lungs: Normal breath sounds, No respiratory distress, No wheezing/rales/rhonchi  Abd: soft, ntnd, no hepatomegaly  Ext: no LEE; 2+ radial pulses      Her EKG  shows: sinus bradycardia, no ST elvations, depressions or q waves    Recent laboratory testing shows:      Component      Latest Ref Rng 07/21/2010   SODIUM      135 - 144 mmol/L 140   POTASSIUM      3.5 - 5.1 mmol/L 4.1   CHLORIDE      101 - 111 mmol/L 104   CARBON DIOXIDE      22 - 32 mmol/L 30   ANION GAP      2 - 25 mmol/L 6   CALCIUM      8.6 - 10.3 mg/dl 9.4   Glucose Random      74 - 160 mg/dl 82   BLOOD UREA NITROGEN      6 - 20 mg/dl 17   TOTAL PROTEIN      5.9 - 7.5 g/dl 6.8   ALBUMIN      3.4 - 4.8 g/dl 3.9   BILIRUBIN TOTAL      0.2 - 1.1 mg/dl 0.2   ALKALINE PHOSPHATASE      25 - 106 IU/L 64   ASPARTATE AMINOTRANSFERAS      8 - 34 IU/L 25   CREATININE      0.4 - 1.2 mg/dl 0.7   ESTIMATED GLOMERULAR FILT RATE      Low: > 60 ML/MIN > 60   ALANINE AMINOTRANSFERASE      7 - 35 IU/L 23     Recent renal US with dromedary hump, but nl kidney    A/P  Preop eval  Based on her history and  physical exam, I estimate that she is at low risk for perioperative cardiac events for a low risk procedure.  No further preoperative testing is recommended.    She has been advised to :  1. Stop antiinflammatory medication 1 week prior to surgery            1. The patient indicates understanding of these issues and agrees with the plan.  2.  The patient is given an After Visit Summary sheet that lists all of their medications with directions, their allergies, orders placed during this encounter, immunization dates, and follow- up instructions.  3. I reviewed the patient's medical information and medical history   4.  I reconciled the patient's medication list and prepared and supplied needed refills.  5.  I have reviewed the past medical, family, and social history sections including the medications and allergies listed in the above medical record    Electronically signed by: Durward Mallard  Sherilyn Cooter, MD, 01/07/2011, 11:06 PM    This note is electronically signed in the electronic medical record.

## 2011-01-09 LAB — EKG

## 2011-01-13 ENCOUNTER — Ambulatory Visit (HOSPITAL_BASED_OUTPATIENT_CLINIC_OR_DEPARTMENT_OTHER): Payer: BC Managed Care – HMO | Admitting: Obstetrics & Gynecology

## 2011-01-13 ENCOUNTER — Encounter (HOSPITAL_BASED_OUTPATIENT_CLINIC_OR_DEPARTMENT_OTHER): Payer: Self-pay | Admitting: Internal Medicine

## 2011-01-13 VITALS — BP 120/77 | HR 63 | Ht 63.0 in | Wt 148.0 lb

## 2011-01-13 DIAGNOSIS — Q639 Congenital malformation of kidney, unspecified: Secondary | ICD-10-CM | POA: Insufficient documentation

## 2011-01-13 DIAGNOSIS — N83209 Unspecified ovarian cyst, unspecified side: Secondary | ICD-10-CM

## 2011-01-13 NOTE — Progress Notes (Signed)
Pt comes in today for preop for L/S BOS. Repeat sono showed resolution of complex L ov cyst. Counseled pt about possible residual ovarian function at her age. R ovarian cyst is stable at 4.1cm (4cm-->4.4cm). It seems that she has had this cyst since it was first dx in [place] in 2000. She had f/u in [place] in 2002 and 2010 and the R ov cyst was stable around 4 cm. She has the reports sent to her via email but is unable to print them she will forward them to me. She also had neg ca125. Here in 2'12. With above information the R ov cyst is stable for many yrs and it would be safe to follow with repeat sono in 6 months x4. If no change can then stop. I gave her the option for oopherectomy discussed all benefits, risks and alternatives. Pt wants to cancel surgery and f/u with repeat sono in 6months .    All questions and concerns were answered to her satisfaction  Total time spent 30 min > 50% in direct counseling

## 2011-01-14 ENCOUNTER — Ambulatory Visit (HOSPITAL_BASED_OUTPATIENT_CLINIC_OR_DEPARTMENT_OTHER): Admit: 2011-01-14 | Payer: Self-pay | Source: Ambulatory Visit | Admitting: Obstetrics & Gynecology

## 2011-01-18 ENCOUNTER — Ambulatory Visit (HOSPITAL_BASED_OUTPATIENT_CLINIC_OR_DEPARTMENT_OTHER): Admit: 2011-01-18 | Payer: Self-pay | Source: Ambulatory Visit | Admitting: Obstetrics & Gynecology

## 2011-01-28 LAB — US PELVIC NON-PREGNANT

## 2011-01-28 LAB — US TRANSVAGINAL NON-OB

## 2011-02-02 ENCOUNTER — Ambulatory Visit (HOSPITAL_BASED_OUTPATIENT_CLINIC_OR_DEPARTMENT_OTHER): Payer: Self-pay | Admitting: Gastroenterology

## 2011-02-18 ENCOUNTER — Ambulatory Visit (HOSPITAL_BASED_OUTPATIENT_CLINIC_OR_DEPARTMENT_OTHER): Payer: Self-pay | Admitting: Gastroenterology

## 2011-04-19 ENCOUNTER — Ambulatory Visit (HOSPITAL_BASED_OUTPATIENT_CLINIC_OR_DEPARTMENT_OTHER): Payer: Self-pay | Admitting: Nephrology

## 2011-04-26 ENCOUNTER — Ambulatory Visit (HOSPITAL_BASED_OUTPATIENT_CLINIC_OR_DEPARTMENT_OTHER): Payer: Self-pay | Admitting: Nephrology

## 2011-05-31 ENCOUNTER — Ambulatory Visit (HOSPITAL_BASED_OUTPATIENT_CLINIC_OR_DEPARTMENT_OTHER): Payer: Self-pay | Admitting: Obstetrics & Gynecology

## 2011-06-01 LAB — US ABDOMEN PELVIS SCROTUM & OR RETROPERITONEUM DOPPLER LIMITED

## 2011-06-01 LAB — US PELVIC NON-PREGNANT

## 2011-06-01 LAB — US TRANSVAGINAL NON-OB

## 2011-06-18 ENCOUNTER — Ambulatory Visit (HOSPITAL_BASED_OUTPATIENT_CLINIC_OR_DEPARTMENT_OTHER): Payer: BC Managed Care – HMO | Admitting: Obstetrics & Gynecology

## 2011-06-18 ENCOUNTER — Ambulatory Visit (HOSPITAL_BASED_OUTPATIENT_CLINIC_OR_DEPARTMENT_OTHER): Payer: Self-pay | Admitting: Obstetrics & Gynecology

## 2011-06-18 DIAGNOSIS — N83209 Unspecified ovarian cyst, unspecified side: Secondary | ICD-10-CM

## 2011-06-18 NOTE — Progress Notes (Signed)
S/  Here to review U/S results.  S/P a hysterectomy.  Up to date with mammograms.    O/  Exam Date: 05/31/11 Exam Status: Signed    Exam: DUP ABD,PELV,SCR&/OR RETRO LIM; PELVIC U/S NON-PREGNANT EXAM;   TRANSVAGINAL ULTRASOUND-NOT OB Reason for Exam: F/U RT OVAR CYST        History: Follow-up right ovarian cyst.    Findings: Transabdominal, endovaginal, and 3-D rendered images were  acquired in order to better evaluate the adnexa.    The right ovary is 5.7 x 4.5 x 5.1 cm. Again noted is a 5.2 x 4.5 x  4.5 cm septated cyst in the right ovary, similar to previous  ultrasounds beginning in February, 2012. Arterial and venous flow is  seen in the right ovary and color Doppler and spectral waveform  analysis.    The left ovary is 1.6 x 2.4 x 1.5 cm. Small left ovarian cyst.    No fluid collection in the cul-de-sac. The urinary bladder appears  unremarkable. No hydronephrosis.        Impression: Stable complex collection (ovarian cyst versus lymphocele  or hydrosalpinx) in the right ovary    A/  Stable right adnexal complex collection.    P/  Pt reassured.

## 2011-06-23 NOTE — Progress Notes (Signed)
error 

## 2012-02-16 ENCOUNTER — Encounter (HOSPITAL_BASED_OUTPATIENT_CLINIC_OR_DEPARTMENT_OTHER): Payer: Self-pay | Admitting: Internal Medicine

## 2012-02-16 ENCOUNTER — Ambulatory Visit (HOSPITAL_BASED_OUTPATIENT_CLINIC_OR_DEPARTMENT_OTHER): Payer: BC Managed Care – HMO | Admitting: Internal Medicine

## 2012-02-16 VITALS — BP 115/68 | HR 70 | Temp 97.5°F | Ht 63.0 in | Wt 144.6 lb

## 2012-02-16 DIAGNOSIS — IMO0002 Reserved for concepts with insufficient information to code with codable children: Secondary | ICD-10-CM

## 2012-02-16 DIAGNOSIS — Z0000 Encounter for general adult medical examination without abnormal findings: Secondary | ICD-10-CM

## 2012-02-16 DIAGNOSIS — E663 Overweight: Secondary | ICD-10-CM

## 2012-02-16 DIAGNOSIS — R42 Dizziness and giddiness: Secondary | ICD-10-CM

## 2012-02-16 DIAGNOSIS — H5789 Other specified disorders of eye and adnexa: Secondary | ICD-10-CM

## 2012-02-16 DIAGNOSIS — L659 Nonscarring hair loss, unspecified: Secondary | ICD-10-CM

## 2012-02-16 DIAGNOSIS — Z1239 Encounter for other screening for malignant neoplasm of breast: Secondary | ICD-10-CM

## 2012-02-16 LAB — CHG ASSAY OF FREE THYROXINE: FREE THYROXINE: 1.19 ng/dl — ABNORMAL HIGH (ref 0.55–1.12)

## 2012-02-16 LAB — CBC WITH PLATELET
HEMATOCRIT: 38.5 % (ref 34.1–44.9)
HEMOGLOBIN: 12.3 g/dL (ref 11.2–15.7)
MEAN CORP HGB CONC: 31.9 g/dL (ref 31.0–37.0)
MEAN CORPUSCULAR HGB: 27.5 pg (ref 26.0–34.0)
MEAN CORPUSCULAR VOL: 86.1 fL (ref 80.0–100.0)
MEAN PLATELET VOLUME: 11 fL (ref 8.7–12.5)
PLATELET COUNT: 277 10*3/uL (ref 150–400)
RBC DISTRIBUTION WIDTH STD DEV: 44.6 fL (ref 35.1–46.3)
RBC DISTRIBUTION WIDTH: 14.2 % (ref 11.5–14.3)
RED BLOOD CELL COUNT: 4.47 M/uL (ref 3.90–5.20)
WHITE BLOOD CELL COUNT: 4.8 10*3/uL (ref 4.0–11.0)

## 2012-02-16 LAB — THYROID SCREEN TSH REFLEX FT4: THYROID SCREEN TSH REFLEX FT4: 0.24 u[IU]/mL — ABNORMAL LOW (ref 0.34–5.60)

## 2012-02-16 MED ORDER — VITAMIN D 50 MCG (2000 UT) PO CAPS
1.0000 | ORAL_CAPSULE | Freq: Every day | ORAL | Status: DC
Start: 2012-02-16 — End: 2013-02-15

## 2012-02-16 MED ORDER — MECLIZINE HCL 12.5 MG PO TABS
12.5000 mg | ORAL_TABLET | Freq: Three times a day (TID) | ORAL | Status: AC | PRN
Start: 2012-02-16 — End: 2012-05-16

## 2012-02-16 NOTE — Patient Instructions (Signed)
[facility]  [facility]  [address], [address]  [place] [address]  Clinical Nutrition Services    Calcium and Your Health    Calcium is a mineral that helps build strong bones and teeth, helps prevent brittle bones (osteoporosis) and hip fractures, and helps maintain a normal blood pressure. It is very important to get enough calcium each day.    * CALCIUM NEEDS AT ALL AGES: (RDI 2000)  Infants birth to 12 months  is 210 -270 mg/day          Child 1-8 years  is 500-.800 mg/day                     Adolescent 9-18years is 1300 mg/day                   Adults 19-30 years is 1000 mg/day  Adults 31-50 years is 1000 mg/day  Adults 51 and older is 1200 mg/day    * TO MAINTAIN GOOD BONE HEALTH, EAT A CALCIUM RICH DIET:    FOODS                                      Yogurt, plain, nonfat: 1 cup serving is 450 mgs of calcium    Ricotta cheese, part skim: 1/2 cup serving is 340 mgs of calcium    Milk, skim: 1 cup serving is 300 mgs of calcium    Milk, whole: 1 cup serving is 290 mgs of calcium    Sardines, canned, with bones: 3 oz serving is 280 mgs of calcium    Yogurt, plain, whole milk:1 cup serving is 275 mgs of calcium    Orange juice, calcium fortified:1 cup serving is 270 mgs of calcium    Swiss cheese: 1 oz serving is 270 mgs of calcium    Spinach, cooked: 1 cup serving is 240 mgs of calcium   Turnip greens, rhubarb, cooked:1 cup serving is 200 mgs of calcium   Salmon, canned, with bones: 3 oz serving is 180 mgs of calcium   Ice Cream: 1 cup serving is 175 mgs of calcium   Pudding, instant mix: 1/2 cup serving is 150 mgs of calcium   Almonds:  /2 cup serving is 150 mgs of calcium   Kale, cooked: 1 cup serving is 100 mgs of calcium   Lobster, cooked: 6 oz serving is 100 mgs of calcium   Tofu, with calcium sulfate:  3oz serving is 100 mgs of calcium     EXERCISE: Weight bearing exercises, such as walking, jogging, racquet sports, aerobics, and weight training  help make bones stronger.      VITAMIN D: Vitamin D is essential to help your body absorb calcium. A healthy body can make its own vitamin D with the help of sunlight. But in the winter months, you may not get enough sun, and should make   sure you get it from another source, like milk or a multivitamin.     HORMONES: The hormone estrogen helps the female body and bones to use calcium. After menopause women need to discuss hormone treatment with their doctor.    * This is just a first step in helping improve your health.  * For help with a meal plan for you, make an appointment with the Nutritionist.  * For more information, you can also contact the National Nutrition Hotline   1-800-366-1655                                                              9/95 MAS 1/96, 12/97eqj, 01/02, 4/02

## 2012-02-16 NOTE — Progress Notes (Signed)
Chief Complaint:  Kayla Garza is a 56 year old female who presents for a physical exam.      Obesity  On diet and exercising daily  Losing weight and pleased with this  Most Recent Weight Reading(s)  02/16/12 : 144 lb 9.6 oz (65.59 kg)  06/18/11 : 152 lb (68.947 kg)  01/13/11 : 148 lb (67.132 kg)  01/07/11 : 152 lb (68.947 kg)  11/17/10 : 154 lb (69.854 kg)      Hair falling out  Notices in shower and on brush  No patches of baldness  There is no scalp rash      Problem with her eyes  Left eye irritated, red, itching  No right sx  Going on for long time- 5 years  No blurry vision  New glasses last year in [place]  Normal eval last year in [place] with eye doctor  She is using drops for dry eyes but minimally effective    Vertigo  Happens once every few months  Room spinning  Relieved by meclizine  Asks for updated rx  Better to have on hand in case of episode    Asks about proper dose for vitamin D  Brings otc bottle with 2000 units      Patient Active Problem List:     Overweight (BMI 25.0-29.9)     Vision loss of left eye, momentary     Vitamin D deficiency     Menopausal syndrome (hot flashes)     Back pain     Vertigo     Ovarian cyst     Allergic rhinitis     Renal anomaly        Current Outpatient Prescriptions:  DISCONTD: Flaxseed MISC by Does not apply route. Disp:  Rfl:    DISCONTD: Multiple Vitamin (MULTIVITAMIN CONCENTRATE IV)  Disp:  Rfl:      No current facility-administered medications for this visit.    Allergies:  Review of Patient's Allergies indicates:  No Known Allergies    Health Maintenance:  Hep B High Risk Vaccine Eval (Once) due on 03/29/1976  Mammography( Yearly) due on 07/24/2011  Physical Exam (Age 110+) due on 09/01/2011  Flu Vaccine Seasonal due on 02/20/2012  Lipid Screening due on 08/21/2014  Health Care Proxy due on 01/07/2016  Hpv Screening due on 02/13/2017  Tetanus (16 And Over) due on 10/29/2017  Colonoscopy due on 11/16/2020  Pap Smear Completed  Hiv Screening  Completed    Immunizations:    Immunization History  Administered            Date(s) Administered    Td                    06/21/1997  10/30/2007      Histories:    Past Medical History    Helicobacter pylori (H. pylori) 2000    Comment: s/p treatment for 2 months    Other and unspecified ovarian cyst 1999    Breast lump     Comment: 1/00 Bx > fibroadenoma         Past Surgical History    CRANIEC TREPHINE BONE FLP BRAIN TUMOR SUPRTENTOR  1980    Comment craniotomy age 23, tumor pituitary    BRST RCNSTJ IMMT/DLYD W/TISS EXPANDER SBSQ XPNSJ  1990    Comment s/p removal of excess skin    HYSTERECTOMY  1997    Comment surgery for uterine fibroids. 7/01 Pap of  vag cuff Negative; ovaries  remain       Social History   Marital Status: Married  Spouse Name: N/A    Years of Education: N/A  Number of Children: 0     Occupational History         Social History Main Topics   Smoking status: Never Smoker     Smokeless tobacco:     Alcohol Use: No    Drug Use: No    Sexually Active: Yes  Partner(s): Female     Other Topics Concern    Exercise Yes    Comment: started 1 year ago     Social History Narrative    Works with Public house manager, lives with husband, feels safe    From [place]    Arrived [place] 1985       Family History    Osteoporosis Mother     Psychiatric Illness Brother     Comment: depression    Cancer - Breast FamHxNeg     Cancer - Colon FamHxNeg        Review of Systems:                   Skin: hair thinning  Eyes: irritation and redness  Ears/Nose/Throat: negative  Respiratory: negative  Cardiovascular: negative  Gastrointestinal: negative  Genitourinary: negative  AVW:UJWJXB menses, no abnormal bleeding, pelvic pain or discharge  no breast pain or new or enlarging lumps on self exam  Musculoskeletal: bone pain if she does not take vitamin D  Neurologic:occasional vertigo relieved by meclizine  Endocrine: negative  Psychiatric: negative  Hematologic/Lymphatic/Immunologic: negative    Physical:  BP 115/68   Pulse 70   Temp(Src)  97.5 F (36.4 C) (Temporal)   Ht 5\' 3"  (1.6 m)   Wt 144 lb 9.6 oz (65.59 kg)   BMI 25.62 kg/m2   SpO2 99%  General appearance: healthy, alert, well developed, well nourished  Eyes: conjunctivae/corneas clear. PERRL, EOM's intact.   Bilateral pterygium with mild inflammation  Skin: skin color, texture, turgor are normal  Hair does show some thinning anteriorally; no patches of alopecia or scalp rash  Head: Normocephalic. No masses, lesions, tenderness or abnormalities  Ears: External ears normal. Canals clear. TM's normal.  Nose/Sinuses: Nares normal. Septum midline. Mucosa normal. No drainage or sinus tenderness.  Oropharynx: Lips, mucosa, and tongue normal. Teeth and gums normal. Oropharynx moist and without lesion  Neck: Neck supple. No adenopathy. Thyroid symmetric, normal size, and without nodularity  Back: Back symmetric, no curvature.  No CVA tenderness.  Lungs:  Good diaphragmatic excursion. Lungs clear to auscultation bilaterally  Heart: PMI normal. No lifts, heaves, or thrills. RRR. No murmurs, clicks, gallops or rubs  Breast: breasts symmetric, no dominant or suspicious mass, no skin or nipple changes, no axillary adenopathy and self exam in taught and encouraged  Abdomen: Abdomen soft, non-tender. BS normal. No masses, no organomegaly  Extremities: Extremities normal. No deformities, edema, or skin discoloration  Musculoskeletal: Muscular strength intact.  Peripheral pulses: radial=4/4,  dorsalis pedis=4/4  Neuro: Gait normal.  Sensation grossly normal   Pelvic:not indicated        Health Counseling:  Smoking:  Reviewed and Discussed  Substance Use Issues:   Reviewed and Discussed   Seat Belts:  Reviewed and Discussed  Diet, Exercise, Wt. Control:  Reviewed and Discussed  Vision Health:  Reviewed and Discussed  Mental Health:  Reviewed and Discussed  Domestic Violence:  Reviewed and Discussed  BSE:  Reviewed and Discussed  Osteoperosis: Reviewed and Discussed  Health Care Proxy:  Reviewed and  Discussed      ASSESSMENT/PLAN:  (V70.0) Routine general medical examination at a health care facility  (primary encounter diagnosis)  Comment: PE done  Pap not indicated  Refer mammogram  Colon cancer screening up to date  Agree with the otc vitamin D she is currently using  Plan: Cholecalciferol (VITAMIN D) 2000 UNITS CAPS        (278.02) Overweight (BMI 25.0-29.9)  Comment: commend her diet, exercise and weight loss; encourage continued healthy lifestyle    (379.99) Eye irritation  Comment: pterygium on exam  Refer opthal for further eval of chronic irritation  Plan: REFERRAL TO OPHTHALMOLOGY ( INT)      (V76.10) Screening for breast cancer  Comment:   Plan: ORDER FOR MAMMOGRAM (SCREENING)      (780.4) Vertigo  Comment: no current sx  rx antivert to have on hand  Plan: meclizine (ANTIVERT) 12.5 MG TABS      (704.00) Hair thinning  Comment: I do appreciate some thinning on exam  Check labs  Plan: THYROID SCREEN TSH, CBC + PLT, COLLECTION         VENOUS BLOOD VENIPUNCTURE    I have reviewed the past medical, surgical, social and family history and updated these sections of EpicCare as relevant. All interim labs, test results, and consult notes were reviewed and discussed with Byrd Hesselbach Da Azucena Freed. Medications were reconciled during this visit and a current medication list was given to the patient at the end of the visit.  We discussed the patients current medications. The patient expressed understanding and no barriers to adherence were identified.

## 2012-02-17 ENCOUNTER — Telehealth (HOSPITAL_BASED_OUTPATIENT_CLINIC_OR_DEPARTMENT_OTHER): Payer: Self-pay | Admitting: Registered Nurse

## 2012-02-17 ENCOUNTER — Other Ambulatory Visit (HOSPITAL_BASED_OUTPATIENT_CLINIC_OR_DEPARTMENT_OTHER): Payer: Self-pay | Admitting: Internal Medicine

## 2012-02-17 DIAGNOSIS — E0590 Thyrotoxicosis, unspecified without thyrotoxic crisis or storm: Secondary | ICD-10-CM

## 2012-02-17 NOTE — Progress Notes (Signed)
Quick Note:    Will ask RN to outreach  Thyroid function mildly abnl and would like to repeat to confirm abnormality  If repeat is abnl we will have her see a specialist  ______

## 2012-02-17 NOTE — Progress Notes (Addendum)
Melody, Cirrincione Da Edward Jolly - 02/16/12 ','<<< Less Detail           [name]           Sent: Thu February 17, 2012 8:40 AM    To: Mickel Crow Rn Pool       Message      Will ask RN to outreach    Thyroid function mildly abnl and would like to repeat to confirm abnormality    If repeat is abnl we will have her see a specialist   02/17/12  904 am  Called pt using a tele interpreter,  No answer  Message left on VM to call the clinic for lab results,     957 am,  02/18/12  i called pt using a tele interpreter,  No answer  Message left on VM to call the clinic for lab results,      02/22/12  839 am  Called pt using a tele interpreter,  3 rd call made to pt,   Pt is not home,  Husband answered,   He gave [place] a number to contact her,    Called that number  Husband had given ,    Message from PCP was read  To pt,  She said she will be in today for the repeat lab test  Message sent to PCP as  Lorain Childes

## 2012-02-22 ENCOUNTER — Ambulatory Visit (HOSPITAL_BASED_OUTPATIENT_CLINIC_OR_DEPARTMENT_OTHER): Payer: BC Managed Care – HMO

## 2012-02-22 DIAGNOSIS — E0590 Thyrotoxicosis, unspecified without thyrotoxic crisis or storm: Secondary | ICD-10-CM

## 2012-02-22 LAB — THYROID SCREEN TSH REFLEX FT4: THYROID SCREEN TSH REFLEX FT4: 0.17 u[IU]/mL — ABNORMAL LOW (ref 0.34–5.60)

## 2012-02-22 NOTE — Progress Notes (Signed)
Labs drawn

## 2012-02-24 NOTE — Progress Notes (Signed)
Quick Note:    Will ask RN to [employer] still consistent with hyperthyroidism  I would like her to see endocrinology  ______

## 2012-02-25 ENCOUNTER — Telehealth (HOSPITAL_BASED_OUTPATIENT_CLINIC_OR_DEPARTMENT_OTHER): Payer: Self-pay | Admitting: Internal Medicine

## 2012-02-25 DIAGNOSIS — E0590 Thyrotoxicosis, unspecified without thyrotoxic crisis or storm: Secondary | ICD-10-CM

## 2012-02-25 NOTE — Progress Notes (Signed)
Notes Recorded by [name] on 02/24/2012 at 9:01 PM  Will ask RN to outreach  Labs still consistent with hyperthyroidism  I would like her to see endocrinology

## 2012-02-28 NOTE — Progress Notes (Addendum)
Due to the language barrier, the phone call was conducted in [place] with an interpreter. The interpreter's name is Annice Pih. The interpreter was on the phone during the entire phone call.    Called and spoke with pt.  Reviewed providers guidance with pt.  She verbalizes understanding to f/u with endocrinology.  She would prefer to see a provider in [place].    Forwarded to provider for referral.

## 2012-02-28 NOTE — Telephone Encounter (Signed)
Message copied by Earney Navy on Mon Feb 28, 2012  3:09 PM  ------       Message from: Germain Osgood ANN       Created: Mon Feb 28, 2012  1:25 PM       Regarding: test results       Contact: [phone]         Message received via fax. Patient looking for test results. Needs [name]. No other information given.  ------

## 2012-02-28 NOTE — Progress Notes (Signed)
Due to the language barrier, the phone call was conducted in [place] with an interpreter. The interpreter's name is Chad. The interpreter was on the phone during the entire phone call.

## 2012-03-02 ENCOUNTER — Ambulatory Visit (HOSPITAL_BASED_OUTPATIENT_CLINIC_OR_DEPARTMENT_OTHER): Payer: Self-pay | Admitting: Internal Medicine

## 2012-03-02 ENCOUNTER — Encounter (HOSPITAL_BASED_OUTPATIENT_CLINIC_OR_DEPARTMENT_OTHER): Payer: Self-pay | Admitting: Internal Medicine

## 2012-03-02 DIAGNOSIS — E0590 Thyrotoxicosis, unspecified without thyrotoxic crisis or storm: Secondary | ICD-10-CM | POA: Insufficient documentation

## 2012-03-02 LAB — MA SCREENING MAMMO BILATERAL WITH CAD

## 2012-03-09 ENCOUNTER — Telehealth (HOSPITAL_BASED_OUTPATIENT_CLINIC_OR_DEPARTMENT_OTHER): Payer: Self-pay | Admitting: Internal Medicine

## 2012-03-09 DIAGNOSIS — E0590 Thyrotoxicosis, unspecified without thyrotoxic crisis or storm: Secondary | ICD-10-CM

## 2012-03-09 NOTE — Telephone Encounter (Signed)
Message copied by Earney Navy on Thu Mar 09, 2012 11:44 AM  ------       Message from: Germain Osgood ANN       Created: Thu Mar 09, 2012  9:53 AM       Regarding: endocrinology appointment        Contact: [phone]         With [name] spoke with patient. She is very concerned that her appointment with endocrinology is not until February 27. She wants to know if there is a way she can be seen sooner.  ------

## 2012-03-09 NOTE — Progress Notes (Signed)
Forwarded to provider for review.  Should pt be seen sooner?

## 2012-03-24 NOTE — Progress Notes (Signed)
I reviewed with endocrine who advised thyroid ultrasound, thryoid scan and then after we have results I can start medication   She would then see endocrine in follow up

## 2012-03-29 ENCOUNTER — Telehealth (HOSPITAL_BASED_OUTPATIENT_CLINIC_OR_DEPARTMENT_OTHER): Payer: Self-pay | Admitting: Internal Medicine

## 2012-03-29 NOTE — Progress Notes (Signed)
Ultrasound scheduled [facility]  04/05/12  1:00PM

## 2012-04-05 ENCOUNTER — Ambulatory Visit (HOSPITAL_BASED_OUTPATIENT_CLINIC_OR_DEPARTMENT_OTHER): Payer: Self-pay | Admitting: Internal Medicine

## 2012-04-05 LAB — US THYROID

## 2012-04-11 NOTE — Progress Notes (Signed)
Quick Note:    Awaiting thyroid scan  ______

## 2012-04-14 ENCOUNTER — Ambulatory Visit (HOSPITAL_BASED_OUTPATIENT_CLINIC_OR_DEPARTMENT_OTHER): Payer: Self-pay | Admitting: Ophthalmology

## 2012-04-18 ENCOUNTER — Ambulatory Visit (HOSPITAL_BASED_OUTPATIENT_CLINIC_OR_DEPARTMENT_OTHER): Payer: Self-pay | Admitting: Ophthalmology

## 2012-05-02 ENCOUNTER — Telehealth (HOSPITAL_BASED_OUTPATIENT_CLINIC_OR_DEPARTMENT_OTHER): Payer: Self-pay | Admitting: Ambulatory Care

## 2012-05-02 NOTE — Telephone Encounter (Signed)
Message copied by Shan Levans on Tue May 02, 2012 10:04 AM  ------       Message from: Sharyne Richters       Created: Tue May 02, 2012  9:12 AM       Regarding: RESULTS                       Kayla Garza [id number], 56 year old, female        Patient's PCP: Duane Lope       Person calling on behalf of patient: Patient (self)              Calls today regarding:        RESULTS FOR ULTRASOUND              Telephone Information:       Home Phone      [phone]       Work Phone      [phone]       Mobile          [phone]              Best number to call back: Work Barrister's clerk [phone] (work)              Okay to leave voice message? no              Patient's language of care: [place]              Patient requires interpreter? YES              Patient's preferred pharmacy:        [facility] - [address] - [place], [place] - [address]       Phone: [phone] Fax: [phone]                              ------

## 2012-05-02 NOTE — Progress Notes (Addendum)
No nodules and increased vascularity consistent with hyperthyroid  She still needs her thyroid scan to be scheduled before we can start meds  Can you talk to front desk about getting this scheduled?  Referral done on October 4th?

## 2012-05-02 NOTE — Progress Notes (Signed)
Message forwarded to PCP to review result.

## 2012-05-04 ENCOUNTER — Emergency Department (HOSPITAL_BASED_OUTPATIENT_CLINIC_OR_DEPARTMENT_OTHER)
Admission: RE | Admit: 2012-05-04 | Payer: Self-pay | Source: Emergency Department | Attending: Emergency Medicine | Admitting: Emergency Medicine

## 2012-05-04 ENCOUNTER — Encounter (HOSPITAL_BASED_OUTPATIENT_CLINIC_OR_DEPARTMENT_OTHER): Payer: Self-pay

## 2012-05-04 MED ORDER — IBUPROFEN 400 MG PO TABS
400.00 mg | ORAL_TABLET | Freq: Once | ORAL | Status: AC
Start: 2012-05-04 — End: 2012-05-04
  Administered 2012-05-04: 400 mg via ORAL
  Filled 2012-05-04: qty 1

## 2012-05-04 MED ORDER — TETANUS-DIPHTH-ACELL PERTUSSIS 5-2.5-18.5 LF-MCG/0.5 IM SUSP
0.5000 mL | Freq: Once | INTRAMUSCULAR | Status: DC
Start: 2012-05-04 — End: 2012-05-04
  Filled 2012-05-04: qty 0.5
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
## 2012-05-04 NOTE — ED Notes (Signed)
Report given to [facility] triage RN.

## 2012-05-04 NOTE — Progress Notes (Signed)
Scheduled for Monday. November 18. Capsule at 8:30AM and scan at 2:00. Called to patient with interpreter and very detailed message left on VM.

## 2012-05-04 NOTE — ED Notes (Signed)
Patient Disposition    Patient education for diagnosis, medications, activity, diet and follow-up.  Patient left ED 2:11 PM.  Patient rep received written instructions.  Interpreter to provide instructions: Yes    Discharged to: Transferred outisde to facility - via [facility] to [facility] ED
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
## 2012-05-04 NOTE — ED Notes (Signed)
Bacitracin and non adherent dsg by PA.
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
## 2012-05-04 NOTE — Progress Notes (Addendum)
Using interpreter, Message left on v/m for pt to call back.  Discussed with Georganna Skeans, she will book appt for pt

## 2012-05-08 ENCOUNTER — Ambulatory Visit: Payer: Self-pay | Admitting: Internal Medicine

## 2012-05-08 LAB — NM THYROID IMAGING

## 2012-05-09 ENCOUNTER — Other Ambulatory Visit (HOSPITAL_BASED_OUTPATIENT_CLINIC_OR_DEPARTMENT_OTHER): Payer: Self-pay | Admitting: Internal Medicine

## 2012-05-09 DIAGNOSIS — E0590 Thyrotoxicosis, unspecified without thyrotoxic crisis or storm: Secondary | ICD-10-CM

## 2012-05-09 MED ORDER — METHIMAZOLE 10 MG PO TABS
10.0000 mg | ORAL_TABLET | Freq: Every day | ORAL | Status: DC
Start: 2012-05-09 — End: 2012-08-17

## 2012-05-09 NOTE — Progress Notes (Signed)
Quick Note:    Will ask RN to outreach  Her thyroid scan is normal  Would like to start methimazole 10 mg daily  She will have repeat labs when she sees endocrine in a few months  ______

## 2012-05-10 ENCOUNTER — Telehealth (HOSPITAL_BASED_OUTPATIENT_CLINIC_OR_DEPARTMENT_OTHER): Payer: Self-pay | Admitting: Internal Medicine

## 2012-05-10 DIAGNOSIS — T300 Burn of unspecified body region, unspecified degree: Secondary | ICD-10-CM
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
## 2012-05-12 ENCOUNTER — Telehealth (HOSPITAL_BASED_OUTPATIENT_CLINIC_OR_DEPARTMENT_OTHER): Payer: Self-pay | Admitting: Ambulatory Care

## 2012-05-12 NOTE — Progress Notes (Addendum)
Will ask RN to outreach Her thyroid scan is normal Would like to start methimazole 10 mg daily She will have repeat labs when she sees endocrine in a few months  Due to the language barrier, the phone call was conducted in [place] with an interpreter. The interpreter's name is [place]. The interpreter was on the phone during the entire phone call.    Pt informed about note above from PCP, agreed with plan, she will contact pharmacy.  Remind pt of appt with Dr. Nathaneil Canary and Francena Hanly.  Verbalized understanding to call back if any questions

## 2012-05-16 NOTE — Progress Notes (Signed)
Referral done

## 2012-05-25 ENCOUNTER — Ambulatory Visit (HOSPITAL_BASED_OUTPATIENT_CLINIC_OR_DEPARTMENT_OTHER): Payer: BC Managed Care – HMO | Admitting: Ophthalmology

## 2012-05-25 ENCOUNTER — Encounter (HOSPITAL_BASED_OUTPATIENT_CLINIC_OR_DEPARTMENT_OTHER): Payer: Self-pay | Admitting: Ophthalmology

## 2012-05-25 DIAGNOSIS — H1010 Acute atopic conjunctivitis, unspecified eye: Secondary | ICD-10-CM

## 2012-05-25 DIAGNOSIS — H11153 Pinguecula, bilateral: Secondary | ICD-10-CM

## 2012-05-25 DIAGNOSIS — H524 Presbyopia: Secondary | ICD-10-CM

## 2012-05-25 DIAGNOSIS — H52209 Unspecified astigmatism, unspecified eye: Secondary | ICD-10-CM | POA: Insufficient documentation

## 2012-05-25 DIAGNOSIS — H5200 Hypermetropia, unspecified eye: Secondary | ICD-10-CM

## 2012-05-25 HISTORY — DX: Presbyopia: H52.00

## 2012-05-25 HISTORY — DX: Unspecified astigmatism, unspecified eye: H52.209

## 2012-05-25 HISTORY — DX: Pinguecula, bilateral: H11.153

## 2012-05-25 HISTORY — DX: Acute atopic conjunctivitis, unspecified eye: H10.10

## 2012-05-25 MED ORDER — KETOTIFEN FUMARATE 0.025 % OP SOLN
1.00 [drp] | Freq: Three times a day (TID) | OPHTHALMIC | Status: AC
Start: 2012-05-25 — End: 2013-05-25

## 2012-05-25 NOTE — Nursing Note (Signed)
>>   ELANA [facility]     Thu May 25, 2012  3:42 PM  New pt here for routine exam. She c/o chronic red eye OS x 5 years, feels scratchy. No photophobia or discharge. She wears glasses for reading

## 2012-05-25 NOTE — Progress Notes (Signed)
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  There is no indication for surgery since there is not an actual pterygium approaching the visual axis yet.  She is encouraged to avoid ultraviolet radiation and wear sunglasses to protect her eyes from further actinic damage.  If a pinguecula becomes a  pterygium that approaches the visual axis, or is chronically irritating, then excision will be recommended.    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  She will use artificial tears in both eyes, 4-6 times a day.    Hyperopia with astigmatism and presbyopia. He's given a prescription for glasses.  Otherwise normal eye exam.      She has allergic conjunctivitis.    She is encouraged to try to identify offending allergens such as wool, dust, pollens, molds, grasses, and avoid them    Additionally, she should apply cold compresses to her closed eyelids.   She should use Zaditor, one drop, both eyes, three times a day as needed for eye itching.

## 2012-08-17 ENCOUNTER — Ambulatory Visit (HOSPITAL_BASED_OUTPATIENT_CLINIC_OR_DEPARTMENT_OTHER): Payer: BC Managed Care – HMO | Admitting: Endocrinology

## 2012-08-17 ENCOUNTER — Encounter (HOSPITAL_BASED_OUTPATIENT_CLINIC_OR_DEPARTMENT_OTHER): Payer: Self-pay | Admitting: Endocrinology

## 2012-08-17 VITALS — BP 132/86 | HR 85 | Temp 97.6°F | Wt 141.8 lb

## 2012-08-17 DIAGNOSIS — Z8639 Personal history of other endocrine, nutritional and metabolic disease: Secondary | ICD-10-CM | POA: Insufficient documentation

## 2012-08-17 DIAGNOSIS — E240 Pituitary-dependent Cushing's disease: Secondary | ICD-10-CM

## 2012-08-17 DIAGNOSIS — E0590 Thyrotoxicosis, unspecified without thyrotoxic crisis or storm: Secondary | ICD-10-CM

## 2012-08-17 LAB — CBC WITH PLATELET
HEMATOCRIT: 38.9 % (ref 34.1–44.9)
HEMOGLOBIN: 12.5 g/dL (ref 11.2–15.7)
MEAN CORP HGB CONC: 32.1 g/dL (ref 31.0–37.0)
MEAN CORPUSCULAR HGB: 28.3 pg (ref 26.0–34.0)
MEAN CORPUSCULAR VOL: 88 fL (ref 80.0–100.0)
MEAN PLATELET VOLUME: 10.6 fL (ref 8.7–12.5)
PLATELET COUNT: 287 10*3/uL (ref 150–400)
RBC DISTRIBUTION WIDTH STD DEV: 46.1 fL (ref 35.1–46.3)
RBC DISTRIBUTION WIDTH: 14.2 % (ref 11.5–14.3)
RED BLOOD CELL COUNT: 4.42 M/uL (ref 3.90–5.20)
WHITE BLOOD CELL COUNT: 4.7 10*3/uL (ref 4.0–11.0)

## 2012-08-17 LAB — FREE THYROXINE: FREE THYROXINE: 1.09 ng/dl (ref 0.55–1.12)

## 2012-08-17 LAB — ALANINE AMINOTRANSFERASE: ALANINE AMINOTRANSFERASE: 24 IU/L (ref 7–35)

## 2012-08-17 LAB — TSH (THYROID STIMULATING HORMONE): TSH (THYROID STIM HORMONE): 6.99 u[IU]/mL — ABNORMAL HIGH (ref 0.34–5.60)

## 2012-08-17 LAB — ASPARTATE AMINOTRANSFERASE: ASPARTATE AMINOTRANSFERASE: 25 IU/L (ref 8–34)

## 2012-08-17 MED ORDER — METHIMAZOLE 10 MG PO TABS
10.0000 mg | ORAL_TABLET | Freq: Every day | ORAL | Status: DC
Start: 2012-08-17 — End: 2012-10-02

## 2012-08-17 NOTE — Progress Notes (Signed)
This is a 57 yo woman who is sent by Dr Donneta Romberg for consultation regarding hyperthyroidism. She reports onset of scalp hair loss and tired legs in 8/13, and abnormal TSH was found as below. Her leg symptoms have resolved, but she still notes diffuse scalp hair loss. ALso her left eye feels irritated and gets red, and she reports constipation. She was started on methimazole 10 mg daily in 11/13 ,and has not yet had f/u labs on med.     She is seen today together with the [name] in person throughout the visit.   Patient Active Problem List:     Overweight (BMI 25.0-29.9)     Vision loss of left eye, momentary     Vitamin D deficiency     Menopausal syndrome (hot flashes)     Back pain     Vertigo     Ovarian cyst     Allergic rhinitis     Renal anomaly     Hyperthyroidism     Pinguecula of both eyes     Allergic conjunctivitis     Hyperopia with astigmatism and presbyopia     Pituitary dependent Cushing disease      Current Outpatient Prescriptions on File Prior to Visit:  methimazole (TAPAZOLE) 10 MG tablet Take 1 tablet by mouth daily. Disp: 30 tablet Rfl: 3   ketotifen (ZADITOR) 0.025 % ophthalmic solution Place 1 drop into both eyes 3 (three) times daily. Apply to both eyes, three times a day, PRN eye itching and redness Disp: 5 mL Rfl: 6   Cholecalciferol (VITAMIN D) 2000 UNITS CAPS Take 1 tablet by mouth daily. Disp: 1 capsule Rfl: 11     No current facility-administered medications on file prior to visit.       Review of Systems   Constitutional: Negative for fever, chills, weight loss, malaise/fatigue and diaphoresis.   HENT: Negative for congestion, sore throat and neck pain.    Eyes: Positive for pain. Negative for blurred vision and double vision.        Left eye as per HPI   Respiratory: Negative for cough, shortness of breath and wheezing.    Cardiovascular: Negative for chest pain, palpitations, orthopnea, claudication and leg swelling.   Gastrointestinal: Positive for constipation.  Negative for heartburn, nausea, vomiting and abdominal pain.   Genitourinary: Negative for dysuria, urgency and frequency.   Musculoskeletal: Positive for back pain. Negative for myalgias and joint pain.   Skin: Negative for itching and rash.   Neurological: Negative for dizziness, tingling, tremors, weakness and headaches.   Endo/Heme/Allergies: Does not bruise/bleed easily.   Psychiatric/Behavioral: Negative for depression. The patient is not nervous/anxious and does not have insomnia.    BP 132/86   Pulse 85   Temp(Src) 97.6 F (36.4 C) (Temporal)   Wt 141 lb 12.8 oz (64.32 kg)   BMI 25.13 kg/m2   SpO2 99%    Physical Exam   Constitutional: She is oriented to person, place, and time. She appears well-developed and well-nourished. No distress.   HENT:   Head: Normocephalic and atraumatic.   Eyes: EOM are normal.   Proptosis os, no lid lag or stare; conjunctival erythema os only; slight periorb edema ou   Neck: Thyromegaly present.   Thyroid full firm irregular NT no nodules; approx 50 g   Cardiovascular: Normal rate, regular rhythm and normal heart sounds.    Pulmonary/Chest: Effort normal and breath sounds normal. No respiratory distress. She has no wheezes. She has no rales.  Abdominal: Bowel sounds are normal. She exhibits no distension and no mass. There is no tenderness. There is no rebound and no guarding.   Musculoskeletal: She exhibits no edema.   Lymphadenopathy:     She has no cervical adenopathy.   Neurological: She is alert and oriented to person, place, and time. She has normal reflexes.   Skin: Skin is warm and dry. No rash noted. She is not diaphoretic. No erythema.   Psychiatric: She has a normal mood and affect.       THYROID FLOWSHEET Latest Ref Rng 02/22/2012 02/16/2012   Thyroid Screen TSH 0.34 - 5.60 uIU/mL 0.17 (L) 0.24 (L)   Thyroid Stim Hormone 0.34 - 5.60 uIU/mL     Free Thyroxine 0.55 - 1.12 ng/dl  2.59 (H)     Exam Date: 04/05/12 Exam Status: Signed   Exam: THYROID ULTRASOUND   Reason for  Exam: HYPERTHYROID   Clinical statement: Hyperthyroid.   Technique: Multiple images obtained during real-time thyroid   sonography.   Comparison Study: None.   Findings:   The thyroid isthmus measures 0.3 cm thick.   The right thyroid lobe measures 4.0 x 1.5 x 2.0 cm.   The left thyroid lobe measures 4.0 x 1.2 x 2.0 cm.   The thyroid is diffusely hypervascular and heterogeneous with no focal   nodules.   Right lymph nodes: Solitary 1.5 x 0.3 x 0.7 cm node.   Left lymph nodes: Solitary 1.8 x 0.3 x 0.6 cm node.   Impression:   Heterogeneous hypervascular thyroid gland.   <<Signature on File in OV>>   Dictated By: Bridgette Habermann MD     05/08/12 Exam Status: Signed   Exam: THYROID IMAGES   Reason for Exam: HYPERTHYROID   Indication: Hyperthyroid   Technique: Thyroid scan using 232 uCi of iodine 125 ingested orally   Comparison: Ultrasound of April 05, 2012   Findings: The thyroid gland shows homogeneous uptake without focal   hot or cold nodule.   Impression: Unremarkable exam.       ASSESSMENT:   1. 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  2. Hx of treated Cushings disease, nothing to suggest recurrence by PE or exam.     RECOMMEND:   1. Labs- thyroid profile, TSI, and LFTs and CBC to check for SE's of methimazole.  2. Given refill on methimazole for now pending labs.  Would reduce methimazole to 5 mg if TSH is >1, ulness it is in the hypothyroid range, in which case it should be stopped for 4-6 wks than TFTs repeated.TSH should be kept at/below 1.0 to avoid stimulating eye disease.   3.F/u 3 months

## 2012-08-21 LAB — THYROID STIM IMMUNOGLOBIN: THYROID STIM IMMUNOGLOBIN: 87 % (ref 0–139)

## 2012-08-22 ENCOUNTER — Telehealth: Payer: Self-pay | Admitting: Family

## 2012-08-22 DIAGNOSIS — E0590 Thyrotoxicosis, unspecified without thyrotoxic crisis or storm: Secondary | ICD-10-CM

## 2012-08-22 NOTE — Progress Notes (Addendum)
Test results ordered by Dr Francena Hanly came back. Discussed with pt via [place] interpreter: all results are normal except for TSH. TSH is 6.99, too high. Pt is instructed to stop taking the Methimazole for 4 weeks and then repeat TSH. Lab slip will be send to pt for repeat blood work in 4 weeks.

## 2012-09-25 ENCOUNTER — Ambulatory Visit: Payer: Self-pay | Admitting: Family

## 2012-09-25 LAB — THYROID SCREEN TSH REFLEX FT4: THYROID SCREEN TSH REFLEX FT4: 3.38 u[IU]/mL (ref 0.358–3.740)

## 2012-10-02 ENCOUNTER — Encounter (HOSPITAL_BASED_OUTPATIENT_CLINIC_OR_DEPARTMENT_OTHER): Payer: Self-pay | Admitting: Endocrinology

## 2012-11-14 ENCOUNTER — Encounter (HOSPITAL_BASED_OUTPATIENT_CLINIC_OR_DEPARTMENT_OTHER): Payer: Self-pay | Admitting: Endocrinology

## 2012-11-14 ENCOUNTER — Ambulatory Visit (HOSPITAL_BASED_OUTPATIENT_CLINIC_OR_DEPARTMENT_OTHER): Payer: BC Managed Care – HMO | Admitting: Endocrinology

## 2012-11-14 VITALS — BP 120/80 | HR 83 | Wt 144.0 lb

## 2012-11-14 DIAGNOSIS — N951 Menopausal and female climacteric states: Secondary | ICD-10-CM

## 2012-11-14 DIAGNOSIS — E0590 Thyrotoxicosis, unspecified without thyrotoxic crisis or storm: Secondary | ICD-10-CM

## 2012-11-14 LAB — THYROID SCREEN TSH REFLEX FT4: THYROID SCREEN TSH REFLEX FT4: 2.64 u[IU]/mL (ref 0.358–3.740)

## 2012-11-14 NOTE — Progress Notes (Signed)
.  patient feels safe at home

## 2012-11-14 NOTE — Progress Notes (Signed)
ZOX:WRUE is a 57 yo woman who presents for f/u of hyperthyroidism, off methimazole since 2/14 due to elevated TSH.  The patient was seen today together with the [name] via telephone, who assisted throughout the visit.      Review of Systems   Constitutional:        Hot flashes \\affecting  head and ears x several months; different than menopausal hot flashes   HENT: Positive for sore throat. Negative for neck pain.    Respiratory: Positive for shortness of breath. Negative for cough.    Cardiovascular: Positive for palpitations. Negative for chest pain.   Gastrointestinal: Negative for heartburn, nausea, vomiting and abdominal pain.   Musculoskeletal: Negative for myalgias, back pain and joint pain.   Neurological: Positive for dizziness and headaches. Negative for tingling and tremors.        Was dizzy a few days ago, resolved   Psychiatric/Behavioral: Negative for depression. The patient has insomnia. The patient is not nervous/anxious.         Sometimes sleeps poorly, not new   BP 120/80   Pulse 83   Wt 144 lb (65.318 kg)   BMI 25.51 kg/m2   SpO2 98%    Physical Exam   Constitutional: She is oriented to person, place, and time. She appears well-developed and well-nourished.   HENT:   Head: Normocephalic and atraumatic.   Eyes: Conjunctivae and EOM are normal.   No proptosis, lid lag or stare   Neck: Normal range of motion. Neck supple. Thyromegaly present.   50 g goiter slightly irregular and firm; no discrete nodules   Cardiovascular: Normal rate, regular rhythm, normal heart sounds and intact distal pulses.  Exam reveals no gallop and no friction rub.    No murmur heard.  Pulmonary/Chest: Effort normal and breath sounds normal. No respiratory distress. She has no wheezes. She has no rales.   Musculoskeletal: Normal range of motion. She exhibits no edema and no tenderness.   Neurological: She is alert and oriented to person, place, and time. She has normal reflexes.   Skin: Skin is warm and dry. No  rash noted. No erythema.   Psychiatric: She has a normal mood and affect.         THYROID FLOWSHEET Latest Ref Rng 09/25/2012 08/17/2012 02/22/2012   Thyroid Screen TSH 0.358 - 3.740 uIU/mL 3.380  0.17 (L)   Thyroid Stim Hormone 0.34 - 5.60 uIU/mL  6.99 (H)    Free Thyroxine 0.55 - 1.12 ng/dl  4.54    TSI 0 - 098 %  87        ASSESSMENT:   1. 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  None of above symptoms appear to be new since her last visit with me 2/14.     RECOMMEND:   1. TSH screen, TPO sent  2. F/u by letter re: results.   3. If normal, f/u 6 months.

## 2012-11-15 LAB — THYROID PEROXIDASE ANTIBODY: THYROID PEROXIDASE ANTIBODY: 13 IU/mL (ref 0–34)

## 2012-11-25 ENCOUNTER — Encounter (HOSPITAL_BASED_OUTPATIENT_CLINIC_OR_DEPARTMENT_OTHER): Payer: Self-pay | Admitting: Endocrinology

## 2013-02-16 ENCOUNTER — Telehealth (HOSPITAL_BASED_OUTPATIENT_CLINIC_OR_DEPARTMENT_OTHER): Payer: Self-pay | Admitting: Internal Medicine

## 2013-02-16 ENCOUNTER — Encounter (HOSPITAL_BASED_OUTPATIENT_CLINIC_OR_DEPARTMENT_OTHER): Payer: Self-pay | Admitting: Internal Medicine

## 2013-02-16 ENCOUNTER — Ambulatory Visit (HOSPITAL_BASED_OUTPATIENT_CLINIC_OR_DEPARTMENT_OTHER): Payer: BC Managed Care – HMO | Admitting: Internal Medicine

## 2013-02-16 VITALS — BP 117/73 | HR 84 | Temp 97.2°F | Ht 63.0 in | Wt 146.0 lb

## 2013-02-16 DIAGNOSIS — Z1159 Encounter for screening for other viral diseases: Secondary | ICD-10-CM

## 2013-02-16 DIAGNOSIS — E559 Vitamin D deficiency, unspecified: Secondary | ICD-10-CM

## 2013-02-16 DIAGNOSIS — E0590 Thyrotoxicosis, unspecified without thyrotoxic crisis or storm: Secondary | ICD-10-CM

## 2013-02-16 DIAGNOSIS — M79606 Pain in leg, unspecified: Secondary | ICD-10-CM

## 2013-02-16 DIAGNOSIS — M25561 Pain in right knee: Secondary | ICD-10-CM

## 2013-02-16 DIAGNOSIS — Z0000 Encounter for general adult medical examination without abnormal findings: Secondary | ICD-10-CM

## 2013-02-16 DIAGNOSIS — Z1239 Encounter for other screening for malignant neoplasm of breast: Secondary | ICD-10-CM

## 2013-02-16 DIAGNOSIS — N83209 Unspecified ovarian cyst, unspecified side: Secondary | ICD-10-CM

## 2013-02-16 MED ORDER — ERGOCALCIFEROL 1.25 MG (50000 UT) PO CAPS
50000.0000 [IU] | ORAL_CAPSULE | ORAL | Status: AC
Start: 2013-02-16 — End: 2013-04-13

## 2013-02-16 NOTE — Progress Notes (Signed)
Appointment scheduled for:    Specialty Location:   [facility]                                                   Specialist's name: Radiology          Specialist's NPI#:                            Specialty Phone Number: [phone]     Specialty Fax Number:     Reason for appointment: pelvic    Day of appt: Friday                                    Date of appt: 03/23/13    Time of appt: 4:00pm   Patient Notification:  Appointment booked with patient  Kayla Garza, 02/16/2013, 5:29 PM

## 2013-02-16 NOTE — Patient Instructions (Signed)
[facility]  [facility]  [address], [address]  [place] [address]  Clinical Nutrition Services    Calcium and Your Health    Calcium is a mineral that helps build strong bones and teeth, helps prevent brittle bones (osteoporosis) and hip fractures, and helps maintain a normal blood pressure. It is very important to get enough calcium each day.    * CALCIUM NEEDS AT ALL AGES: (RDI 2000)  Infants birth to 12 months  is 210 -270 mg/day          Child 1-8 years  is 500-.800 mg/day                     Adolescent 9-18years is 1300 mg/day                   Adults 19-30 years is 1000 mg/day  Adults 31-50 years is 1000 mg/day  Adults 51 and older is 1200 mg/day    * TO MAINTAIN GOOD BONE HEALTH, EAT A CALCIUM RICH DIET:    FOODS                                      Yogurt, plain, nonfat: 1 cup serving is 450 mgs of calcium    Ricotta cheese, part skim: 1/2 cup serving is 340 mgs of calcium    Milk, skim: 1 cup serving is 300 mgs of calcium    Milk, whole: 1 cup serving is 290 mgs of calcium    Sardines, canned, with bones: 3 oz serving is 280 mgs of calcium    Yogurt, plain, whole milk:1 cup serving is 275 mgs of calcium    Orange juice, calcium fortified:1 cup serving is 270 mgs of calcium    Swiss cheese: 1 oz serving is 270 mgs of calcium    Spinach, cooked: 1 cup serving is 240 mgs of calcium   Turnip greens, rhubarb, cooked:1 cup serving is 200 mgs of calcium   Salmon, canned, with bones: 3 oz serving is 180 mgs of calcium   Ice Cream: 1 cup serving is 175 mgs of calcium   Pudding, instant mix: 1/2 cup serving is 150 mgs of calcium   Almonds:  /2 cup serving is 150 mgs of calcium   Kale, cooked: 1 cup serving is 100 mgs of calcium   Lobster, cooked: 6 oz serving is 100 mgs of calcium   Tofu, with calcium sulfate:  3oz serving is 100 mgs of calcium     EXERCISE: Weight bearing exercises, such as walking, jogging, racquet sports, aerobics, and weight training  help make bones stronger.      VITAMIN D: Vitamin D is essential to help your body absorb calcium. A healthy body can make its own vitamin D with the help of sunlight. But in the winter months, you may not get enough sun, and should make   sure you get it from another source, like milk or a multivitamin.     HORMONES: The hormone estrogen helps the female body and bones to use calcium. After menopause women need to discuss hormone treatment with their doctor.    * This is just a first step in helping improve your health.  * For help with a meal plan for you, make an appointment with the Nutritionist.  * For more information, you can also contact the National Nutrition Hotline   1-800-366-1655                                                              9/95 MAS 1/96, 12/97eqj, 01/02, 4/02

## 2013-02-16 NOTE — Progress Notes (Signed)
Chief Complaint:  Kayla Garza is a 57 year old female who presents for a physical exam.      Leg pain  Both legs  Aching pain  Had in past and was better when on vitamin D 50,000 units  Not relieved by vitamin D 2000 or MVI  Not cramping  Both legs below the knees    Knee pain  Right leg  Back of knee  She feels she loses movement in this area  Blames varicose veins  Feels issue is problem with a vein inside  When it bothers her she cannot fully flex, extend knees    Concerns about ovarian cyst  Last had [place] 2 years ago and was stable  Would like to recheck this  No pelvic pain, bloating        Patient Active Problem List:     Overweight (BMI 25.0-29.9)     Vision loss of left eye, momentary     Vitamin D deficiency     Menopausal syndrome (hot flashes)     Back pain     Vertigo     Ovarian cyst     Allergic rhinitis     Renal anomaly     Hyperthyroidism     Pinguecula of both eyes     Allergic conjunctivitis     Hyperopia with astigmatism and presbyopia     Pituitary dependent Cushing disease        Current Outpatient Prescriptions:  ketotifen (ZADITOR) 0.025 % ophthalmic solution Place 1 drop into both eyes 3 (three) times daily. Apply to both eyes, three times a day, PRN eye itching and redness Disp: 5 mL Rfl: 6     No current facility-administered medications for this visit.    Allergies:  Review of Patient's Allergies indicates:  No Known Allergies    Health Maintenance:  Physical Exam (Age 36+) due on 02/15/2013  Flu Vaccine Seasonal due on 02/19/2013  Mammography due on 03/02/2014  Lipid Screening due on 08/21/2014  Health Care Proxy due on 01/07/2016  Hpv Screening due on 02/13/2017  Tetanus (16 And Over) due on 10/29/2017  Colonoscopy due on 11/16/2020  Pap Smear Completed  Hiv Screening Completed  Hep B High Risk Vaccine Eval (Once) Completed    Immunizations:  Immunization History   Administered Date(s) Administered    Td 06/21/1997, 10/30/2007       Histories:    Past Medical History     Helicobacter pylori (H. pylori) 2000    Comment: s/p treatment for 2 months    Other and unspecified ovarian cyst 1999    Breast lump     Comment: 1/00 Bx > fibroadenoma    Pinguecula of both eyes 05/25/2012    Allergic conjunctivitis 05/25/2012    Hyperopia with astigmatism and presbyopia 05/25/2012         Past Surgical History    CRANIEC TREPHINE BONE FLP BRAIN TUMOR SUPRTENTOR  1980    Comment craniotomy age 29, tumor pituitary    BRST RCNSTJ IMMT/DLYD W/TISS EXPANDER SBSQ XPNSJ  1990    Comment s/p removal of excess skin    HYSTERECTOMY  1997    Comment surgery for uterine fibroids. 7/01 Pap of  vag cuff Negative; ovaries remain       Social History   Marital Status: Married  Spouse Name: N/A    Years of Education: N/A  Number of Children: 0     Occupational History  Social History Main Topics   Smoking status: Never Smoker     Smokeless tobacco: Not on file    Alcohol Use: No    Drug Use: No    Sexually Active: Yes  Partner(s): Female     Other Topics Concern    Stress Concern No    Exercise No     Social History Narrative    Works with Public house manager, lives with husband, feels safe    From [place]    Arrived [place] 1985       Family History    Osteoporosis Mother     Psychiatric Illness Brother     Comment: depression    Cancer - Breast FamHxNeg     Cancer - Colon FamHxNeg     Diabetes FamHxNeg     Heart FamHxNeg        Review of Systems:                   Skin: negative  Eyes: negative  Ears/Nose/Throat: negative  Respiratory: negative  Cardiovascular: negative  Gastrointestinal: negative  Genitourinary: negative  Gyn:no abnormal bleeding, pelvic pain or discharge  no breast pain or new or enlarging lumps on self exam  Musculoskeletal: leg pain  ?vitamin D defic  Neurologic: negative  Endocrine: thyroid disease better  Psychiatric: negative  Hematologic/Lymphatic/Immunologic: negative    Physical:  BP 117/73   Pulse 84   Temp(Src) 97.2 F (36.2 C) (Temporal)   Ht 5\' 3"  (1.6 m)   Wt 146 lb (66.225 kg)   BMI 25.87  kg/m2   SpO2 100%  General appearance: healthy, alert, well developed, well nourished  Eyes: conjunctivae/corneas clear. PERRL, EOM's intact.   Skin: skin color, texture, turgor are normal  Head: Normocephalic. No masses, lesions, tenderness or abnormalities  Ears: External ears normal. Canals clear. TM's normal.  Nose/Sinuses: Nares normal. Septum midline. Mucosa normal. No drainage or sinus tenderness.  Oropharynx: Lips, mucosa, and tongue normal. Teeth and gums normal. Oropharynx moist and without lesion  Neck: Neck supple. No adenopathy. Thyroid symmetric, without nodularity  Back: Back symmetric, no curvature.  No CVA tenderness.  Lungs:  Good diaphragmatic excursion. Lungs clear to auscultation bilaterally  Heart: PMI normal. No lifts, heaves, or thrills. RRR. No murmurs, clicks, gallops or rubs  Breast: breasts symmetric, no dominant or suspicious mass, no skin or nipple changes, no axillary adenopathy and self exam in taught and encouraged  Abdomen: Abdomen soft, non-tender. BS normal. No masses, no organomegaly  Extremities: Extremities normal. No deformities, edema, or skin discoloration  There are some spider veins on both posterior legs  Right knee with slight swelling posterior knee  No joint line tenderness, no effusion, full ROM  Gait normal  Musculoskeletal: Muscular strength intact.  Peripheral pulses: radial=4/4,  dorsalis pedis=4/4  Neuro: Gait normal.  Sensation grossly normal   Pelvic:not indicated    Health Counseling:  Smoking:  Reviewed and Discussed  Substance Use Issues:   Reviewed and Discussed   Seat Belts:  Reviewed and Discussed  Diet, Exercise, Wt. Control:  Reviewed and Discussed  Vision Health:  Reviewed and Discussed  Mental Health:  Reviewed and Discussed  Domestic Violence:  Reviewed and Discussed  Sexual Health:  Reviewed and Discussed  BSE:  Reviewed and Discussed  Osteoporosis: Reviewed and Discussed  Health Care Proxy:  Reviewed and Discussed      ASSESSMENT/PLAN:  (V70.0)  Routine general medical examination at a health care facility  (primary encounter diagnosis)  Comment: PE  done  mammo referral      (242.90) Hyperthyroidism  Comment: reviewed endocrine notes, plan of care with pt    (268.9) Vitamin D deficiency  Comment: check vitamin D levels  rx high dose supplements x1 only as she strongly relates her bilateral leg pain to this    Plan: ergocalciferol (VITAMIN D2) 50000 UNIT capsule,        VITAMIN D,25 HYDROXY, COLLECTION VENOUS BLOOD         VENIPUNCTURE      (V73.89) Need for hepatitis C screening test  Comment:   Plan: HEPATITIS C ANTIBODY, COLLECTION VENOUS BLOOD         VENIPUNCTURE    (729.5) Leg pain  Comment:   Plan: VITAMIN D,25 HYDROXY, COLLECTION VENOUS BLOOD         VENIPUNCTURE      (V76.10) Screening for malignant neoplasm of breast  Comment:   Plan: [place] MAMMOGRAPHY SCREENING BILATERAL W CAD      (719.46) Right knee pain  Comment: I don't think her small varicose veins are the cause of this posterior knee pain  Veins similar right and left and only has pain on right  Refer for xray right knee  ?bakers cyst  Consider ortho or vascular eval if xray not revealing  Plan: XR KNEE RIGHT 3 VW      (620.2) Ovarian cyst  Comment:   Plan: US PELVIC NONOBSTETRIC REAL-TIME IMAGE COMPLETE      I have reviewed the past medical, surgical, social and family history and updated these sections of EpicCare as relevant. All interim labs, test results, and consult notes were reviewed and discussed with Byrd Hesselbach Da Azucena Freed. Medications were reconciled during this visit and a current medication list was given to the patient at the end of the visit.  We discussed the patients current medications. The patient expressed understanding and no barriers to adherence were identified.

## 2013-02-16 NOTE — Progress Notes (Signed)
Appointment scheduled for:    Specialty Location:   [facility]                                                   Specialist's name: Radiology          Specialist's NPI#:                            Specialty Phone Number: [phone]     Specialty Fax Number:     Reason for appointment: Mammogram    Day of appt: Friday                                    Date of appt: 03/23/13    Time of appt: 3:20pm   Patient Notification:  Appointment booked with patient   Sallee Lange, 02/16/2013, 5:20 PM

## 2013-02-17 LAB — HEPATITIS C ANTIBODY: HEPATITIS C ANTIBODY: NEGATIVE

## 2013-02-17 LAB — VITAMIN D,25 HYDROXY: VITAMIN D,25 HYDROXY: 35 ng/mL (ref 30.0–100.0)

## 2013-02-19 NOTE — Progress Notes (Signed)
Quick Note:    Normal lab letter sent in Portuguese  ______

## 2013-03-23 ENCOUNTER — Ambulatory Visit (HOSPITAL_BASED_OUTPATIENT_CLINIC_OR_DEPARTMENT_OTHER): Payer: Self-pay | Admitting: Internal Medicine

## 2013-03-23 DIAGNOSIS — N83209 Unspecified ovarian cyst, unspecified side: Secondary | ICD-10-CM

## 2013-03-23 DIAGNOSIS — Z1239 Encounter for other screening for malignant neoplasm of breast: Secondary | ICD-10-CM

## 2013-03-23 DIAGNOSIS — M25561 Pain in right knee: Secondary | ICD-10-CM

## 2013-03-23 LAB — US TRANSVAGINAL NON-OB

## 2013-03-23 LAB — XR KNEE RIGHT 3 VIEWS

## 2013-03-23 LAB — MA SCREENING MAMMO BILATERAL WITH CAD

## 2013-03-23 LAB — US PELVIC NON-PREGNANT

## 2013-03-26 ENCOUNTER — Encounter (HOSPITAL_BASED_OUTPATIENT_CLINIC_OR_DEPARTMENT_OTHER): Payer: Self-pay | Admitting: Internal Medicine

## 2013-03-26 NOTE — Progress Notes (Signed)
Quick Note:    Will ask RN to outreach  Ovarian cyst is stable and unchanged from previous imaging. 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

## 2013-03-27 ENCOUNTER — Telehealth (HOSPITAL_BASED_OUTPATIENT_CLINIC_OR_DEPARTMENT_OTHER): Payer: Self-pay | Admitting: Internal Medicine

## 2013-03-27 NOTE — Progress Notes (Addendum)
Due to the language barrier, the phone call was conducted in [place] with an interpreter. The interpreter's name is Language line. The interpreter was on the phone during the entire phone call.    Called pt at home and advised to try her cell phone.    Called pt x 3 on cell phone, interpreter initially had difficulty placing call and on third attempt got pt's voicemail, left message to call clinic.

## 2013-03-27 NOTE — Progress Notes (Signed)
Notes Recorded by [name] on 03/26/2013 at 9:40 AM  Will ask RN to outreach  Ovarian cyst is stable and unchanged from previous imaging. 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

## 2013-03-28 NOTE — Telephone Encounter (Signed)
Message copied by Earney Navy on Wed Mar 28, 2013  2:40 PM  ------       Message from: Lissa Hoard       Created: Wed Mar 28, 2013  2:32 PM       Regarding: call back                        Kayla Garza [id number], 57 year old, female, Telephone Information:       Home Phone      [phone]       Work Phone      Not on file.       Mobile          [phone]                     Patient's Preferred Pharmacy:               [facility] - [address] [place], [place] - [address]       Phone: [phone] Fax: [phone]                     CONFIRMED TODAY: Lovett Sox NUMBER: [phone]       Best time to call back: any       Cell phone:        Other phone:              Available times:              Patient's language of care: [place]              Patient does not need an interpreter.              Patient's PCP: Duane Lope              Person calling on behalf of patient: Patient (self)              Calls today to speak to nurse only.                       ------

## 2013-03-28 NOTE — Progress Notes (Signed)
Due to the language barrier, the phone call was conducted in [place] with an interpreter. The interpreter's name is Chad. The interpreter was on the phone during the entire phone call.    Called and spoke with pt.  Reviewed recent results and providers guidance,  She will try the tylenol as directed and f/u if knee pain not improving.

## 2013-03-28 NOTE — Progress Notes (Signed)
Due to the language barrier, the phone call was conducted in [place] with an interpreter. The interpreter's name is [place]. The interpreter was on the phone during the entire phone call.    2nd message left via interpreter to call clinic.

## 2013-03-28 NOTE — Telephone Encounter (Signed)
Message copied by Earney Navy on Wed Mar 28, 2013 12:23 PM  ------       Message from: Lissa Hoard       Created: Wed Mar 28, 2013  9:35 AM       Regarding: results                        Kayla Garza [id number], 57 year old, female, Telephone Information:       Home Phone      [phone]       Work Phone      Not on file.       Mobile          [phone]                     Patient's Preferred Pharmacy:               [facility] - [address] [place], [place] - [address]       Phone: [phone] Fax: [phone]                     CONFIRMED TODAY: Yes              CALL BACK NUMBER: [phone]       Best time to call back: any       Cell phone:        Other phone:              Available times:              Patient's language of care: Portuguese              Patient needs portuguese interpretor              Patient's PCP: Duane Lope              Person calling on behalf of patient: Patient (self)              Calls today for test result(s). Please call with interpretor                       ------

## 2013-03-30 ENCOUNTER — Encounter (HOSPITAL_BASED_OUTPATIENT_CLINIC_OR_DEPARTMENT_OTHER): Payer: Self-pay | Admitting: Internal Medicine

## 2013-03-30 DIAGNOSIS — M170 Bilateral primary osteoarthritis of knee: Secondary | ICD-10-CM | POA: Insufficient documentation

## 2013-04-26 ENCOUNTER — Ambulatory Visit (HOSPITAL_BASED_OUTPATIENT_CLINIC_OR_DEPARTMENT_OTHER): Payer: Self-pay | Admitting: Internal Medicine

## 2013-04-26 DIAGNOSIS — R928 Other abnormal and inconclusive findings on diagnostic imaging of breast: Secondary | ICD-10-CM

## 2013-04-26 LAB — US BREAST-AXILLA LEFT

## 2013-04-26 LAB — MA DIAGNOSTIC MAMMO UNILATERAL LEFT WITH CAD

## 2013-05-15 ENCOUNTER — Ambulatory Visit (HOSPITAL_BASED_OUTPATIENT_CLINIC_OR_DEPARTMENT_OTHER): Payer: BC Managed Care – HMO | Admitting: Endocrinology

## 2013-05-15 ENCOUNTER — Encounter (HOSPITAL_BASED_OUTPATIENT_CLINIC_OR_DEPARTMENT_OTHER): Payer: Self-pay | Admitting: Endocrinology

## 2013-05-15 VITALS — BP 106/62 | HR 70 | Wt 146.0 lb

## 2013-05-15 DIAGNOSIS — E0590 Thyrotoxicosis, unspecified without thyrotoxic crisis or storm: Secondary | ICD-10-CM

## 2013-05-15 DIAGNOSIS — R42 Dizziness and giddiness: Secondary | ICD-10-CM

## 2013-05-15 LAB — CBC WITH PLATELET
HEMATOCRIT: 38.1 % (ref 34.1–44.9)
HEMOGLOBIN: 12.4 g/dL (ref 11.2–15.7)
MEAN CORP HGB CONC: 32.5 g/dL (ref 31.0–37.0)
MEAN CORPUSCULAR HGB: 27.8 pg (ref 26.0–34.0)
MEAN CORPUSCULAR VOL: 85.4 fL (ref 80.0–100.0)
MEAN PLATELET VOLUME: 10.7 fL (ref 8.7–12.5)
PLATELET COUNT: 268 10*3/uL (ref 150–400)
RBC DISTRIBUTION WIDTH STD DEV: 44.9 fL (ref 35.1–46.3)
RBC DISTRIBUTION WIDTH: 14.4 % — ABNORMAL HIGH (ref 11.5–14.3)
RED BLOOD CELL COUNT: 4.46 M/uL (ref 3.90–5.20)
WHITE BLOOD CELL COUNT: 5.2 10*3/uL (ref 4.0–11.0)

## 2013-05-15 LAB — THYROID SCREEN TSH REFLEX FT4: THYROID SCREEN TSH REFLEX FT4: 2.66 u[IU]/mL (ref 0.358–3.740)

## 2013-05-15 NOTE — Progress Notes (Signed)
.  patient feels safe at home

## 2013-05-15 NOTE — Progress Notes (Signed)
This is a 57 yo woman who presents for 6 month f/u of hyperthyroidism, off methimazole since 2/14.  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   The patient was seen today together with the [name] via telephone, who assisted throughout the visit.   I have reviewed the patient's medical, social, family history and meds in detail and updated the computerized patient record.         Review of Systems   Constitutional: Negative for fever, chills, weight loss and malaise/fatigue.        Hot flushes persist   HENT: Negative for sore throat.         Sees white spots in back of throat   Respiratory: Negative for cough and shortness of breath.    Cardiovascular: Negative for chest pain and palpitations.   Gastrointestinal: Negative for heartburn, nausea, vomiting and abdominal pain.   Musculoskeletal: Negative for back pain, joint pain, myalgias and neck pain.   Neurological: Positive for dizziness. Negative for tingling, tremors, sensory change and headaches.        Feels dizziness 8-10 AM every day; works at [employer] 6:30A- 3P; does not feel it is work related as happens on days off as well, does not last all day. Duration 3-4 months; stands all day pressing clothes   Psychiatric/Behavioral: Negative for depression. The patient has insomnia.         Awakens several times nightly; not new   BP 106/62   Pulse 70   Wt 146 lb (66.225 kg)   BMI 25.87 kg/m2   SpO2 100%    Physical Exam   Constitutional: She is oriented to person, place, and time. She appears well-developed and well-nourished. No distress.   HENT:   Head: Normocephalic and atraumatic.   Mouth/Throat: Oropharynx is clear and moist. No oropharyngeal exudate.   Eyes: Conjunctivae and EOM are normal.   Neck: Normal range of motion. Neck supple. Thyromegaly present.   50 g firm irreg goiter; no nodules felt   Cardiovascular: Normal rate, regular  rhythm and normal heart sounds.  Exam reveals no gallop and no friction rub.    No murmur heard.  Pulmonary/Chest: Effort normal and breath sounds normal. No respiratory distress. She has no wheezes. She has no rales.   Musculoskeletal: Normal range of motion. She exhibits no edema and no tenderness.   Lymphadenopathy:     She has no cervical adenopathy.   Neurological: She is alert and oriented to person, place, and time. She has normal reflexes.   Skin: Skin is warm and dry. She is not diaphoretic. No erythema.   Psychiatric: She has a normal mood and affect.     THYROID FLOWSHEET Latest Ref Rng 11/14/2012 09/25/2012   Thyroid Screen TSH 0.358 - 3.740 uIU/mL 2.640 3.380   Thyroid Stim Hormone 0.34 - 5.60 uIU/mL     Free Thyroxine 0.55 - 1.12 ng/dl     Anti Microsomal Antibody (TPO) 0 - 34 IU/mL 13    TSI 0 - 139 %       THYROID FLOWSHEET Latest Ref Rng 08/17/2012   Thyroid Screen TSH 0.358 - 3.740 uIU/mL    Thyroid Stim Hormone 0.34 - 5.60 uIU/mL 6.99 (H)   Free Thyroxine 0.55 - 1.12 ng/dl 1.61   Anti Microsomal Antibody (TPO) 0 - 34 IU/mL    TSI 0 - 139 % 87  ASSESSMENT:   1. Goiter, hx of hyperthyroidism, appears clinically euthyroid without change in size of goiter or palpable nodules.   2. Multiple symptoms similar to last visit, although the dizziness is more frequent and sustained than she reported in 5/14.    RECOMMEND:   1. TSH screen sent. Will send letter regarding result. If ok, f/u in 1 year  2. CBC sent to evaluate dizziness, though patient is postmenopausal so anemia unlikely unless bleeding, which denies.  3. Patient advised to talk to PCP if dizziness and other symptoms persist

## 2013-05-27 ENCOUNTER — Encounter (HOSPITAL_BASED_OUTPATIENT_CLINIC_OR_DEPARTMENT_OTHER): Payer: Self-pay | Admitting: Endocrinology

## 2013-05-31 ENCOUNTER — Encounter (HOSPITAL_BASED_OUTPATIENT_CLINIC_OR_DEPARTMENT_OTHER): Payer: Self-pay | Admitting: Internal Medicine

## 2013-05-31 ENCOUNTER — Ambulatory Visit (HOSPITAL_BASED_OUTPATIENT_CLINIC_OR_DEPARTMENT_OTHER): Payer: BC Managed Care – HMO | Admitting: Internal Medicine

## 2013-05-31 VITALS — BP 134/86 | HR 104 | Temp 98.6°F | Wt 144.0 lb

## 2013-05-31 DIAGNOSIS — R319 Hematuria, unspecified: Secondary | ICD-10-CM

## 2013-05-31 DIAGNOSIS — Z23 Encounter for immunization: Secondary | ICD-10-CM

## 2013-05-31 LAB — URINALYSIS
BILIRUBIN, URINE: NEGATIVE
GLUCOSE, URINE: NEGATIVE MG/DL
KETONE, URINE: NEGATIVE MG/DL
LEUKOCYTE ESTERASE: NEGATIVE
NITRITE, URINE: NEGATIVE
OCCULT BLOOD, URINE: NEGATIVE
PH URINE: 6 (ref 5.0–8.0)
PROTEIN, URINE: NEGATIVE MG/DL
SPECIFIC GRAVITY URINE: 1.01 (ref 1.003–1.035)

## 2013-05-31 NOTE — Patient Instructions (Signed)
I suspect your symptom is coming from your urethra or bladder.  I will check your urine for further evaluation.    Please call the office if not getting better.

## 2013-05-31 NOTE — Progress Notes (Signed)
When she goes to teh bath room and urinates and wipe she has blood on the toilet paper  She is nervous.  No increase in urine   No pain  No fever nor chills  She does mention dizziness occurs in the morning between 8am-10am     Happened twice.  No pelvic pain  No discharge    Has vaginal bleeding for two day.  SHe did have a hysterectomy done on 12/1999  She had an U/S transvaginal on 03/23/2013 which did show an right ovarian cyst with single thick septation     Her right eye has been bothering her as well  Started 1-2 months ago          No current outpatient prescriptions on file prior to visit.  No current facility-administered medications on file prior to visit.    Past Medical History    Helicobacter pylori (H. pylori) 2000    Comment: s/p treatment for 2 months    Other and unspecified ovarian cyst 1999    Breast lump     Comment: 1/00 Bx > fibroadenoma    Pinguecula of both eyes 05/25/2012    Allergic conjunctivitis 05/25/2012    Hyperopia with astigmatism and presbyopia 05/25/2012       Family History    Osteoporosis Mother     Psychiatric Illness Brother     Comment: depression    Cancer - Breast FamHxNeg     Cancer - Colon FamHxNeg     Diabetes FamHxNeg     Heart FamHxNeg        Social History   Marital Status: Married  Spouse Name: N/A    Years of Education: N/A  Number of Children: 0     Occupational History         Social History Main Topics   Smoking status: Never Smoker     Smokeless tobacco: Not on file    Alcohol Use: No    Drug Use: No    Sexual Activity: Yes    Partners: Male     Other Topics Concern    Stress Concern No    Exercise No     Social History Narrative    Works with Public house manager, lives with husband, feels safe    From [place]    Arrived [place] 1985     Review of Patient's Allergies indicates:  No Known Allergies    Family History    Osteoporosis Mother     Psychiatric Illness Brother     Comment: depression    Cancer - Breast FamHxNeg     Cancer - Colon FamHxNeg     Diabetes FamHxNeg     Heart  FamHxNeg        Vaginal Bleeding    Eye Problem   Pertinent negatives include no blurred vision, eye discharge, double vision, eye redness or photophobia.     Review of Systems   Eyes: Negative for blurred vision, double vision, photophobia, pain, discharge and redness.        Lump on right lower eye lid   Genitourinary: Positive for hematuria and vaginal bleeding. Negative for dysuria, urgency, frequency and flank pain.          05/31/13  1623   BP: 134/86   Pulse: 104   Temp: 98.6 F (37 C)   TempSrc: Oral   Weight: 144 lb (65.318 kg)   SpO2: 99%       Physical Exam   Constitutional: She appears well-developed and  well-nourished. No distress.   HENT:   Head: Normocephalic and atraumatic.   Eyes: Conjunctivae and EOM are normal. Pupils are equal, round, and reactive to light. Right eye exhibits no discharge. Left eye exhibits no discharge. No scleral icterus.   Right stye resolved.  No drainage or redness.   Genitourinary: Vagina normal. No vaginal discharge found.   Do not see any ulceration or any discoloration on her labia majora and minora.  No bleeding from the urethra as well.     Skin: She is not diaphoretic.         (599.70) Hematuria  (primary encounter diagnosis)  Comment: check urine for blood if pos will need further work up.  Possible urine cytology and Renal/bladder U/S    Plan: URINALYSIS, URINE CULTURE            (V04.81) Need for prophylactic vaccination and inoculation against influenza  Comment:   Plan: IMMUNIZATION ADMIN SINGLE, RN, PR INFLUENZA         VACCINE QUADRIVALENT 3 YRS PLUS IM              We discussed the patients current medications. The patient expressed understanding and no barriers to adherence were identified.   1. The patient indicates understanding of these issues and agrees with the plan. Brief care plan is updated and reviewed with the patient.   2. The patient is given an After Visit Summary sheet that lists all medications with directions, allergies, orders placed during  this encounter, and follow-up instructions.   3. I reviewed the patient's medical information and medical history   4. I reconciled the patient's medication list and prepared and supplied needed refills.   5. I have reviewed the past medical, family, and social history sections including the medications and allergies.

## 2013-05-31 NOTE — Progress Notes (Signed)
Influenza Vaccine Procedure  May 31, 2013    1. Has the patient received the information for the influenza vaccine? Yes    2. Does the patient have any of the following contraindications?  Allergy to eggs? No  Allergic reaction to previous influenza vaccines? No  Any other problems to previous influenza vaccines? No  Paralyzed by Guillain-Barre syndrome?  No  Current moderate or severe illness? No  Allergy to contact lens solution? No    3. The vaccine has been administered in the usual fashion.     Immunization information reviewed. Current VIS reviewed and given to patient/ guardian. Verbal assent obtained from patient/ guardian.  See immunization/Injection module or chart review for date of publication and additional information. Verbal assent obtained from patient/guardian. Comfort measures for possible side effects reviewed.

## 2013-06-01 LAB — URINE CULTURE

## 2013-06-01 NOTE — Progress Notes (Signed)
Quick Note:    Please inform pt her urine does not show any blood at all. I would recommend to call the office if she is continuing to have blood when she   Wipes after she urinates. It could still be from irritation from her urethra. Thank you.      Corwin Levins. Vinnie Langton, MD, 06/01/2013, 7:29 AM    ______

## 2013-06-07 ENCOUNTER — Telehealth (HOSPITAL_BASED_OUTPATIENT_CLINIC_OR_DEPARTMENT_OTHER): Payer: Self-pay | Admitting: Internal Medicine

## 2013-06-07 NOTE — Progress Notes (Signed)
Notes Recorded by Corwin Levins. Vinnie Langton, MD on 06/01/2013 at 7:29 AM  Please inform pt her urine does not show any blood at all. I would recommend to call the office if she is continuing to have blood when she   Wipes after she urinates. It could still be from irritation from her urethra. Thank you.

## 2013-06-07 NOTE — Progress Notes (Addendum)
Due to the language barrier, the phone call was conducted in [place] with an interpreter. The interpreter's name is Eliana. The interpreter was on the phone during the entire phone call.    Via interpreter, Message left for pt to call clinic.

## 2013-06-08 NOTE — Progress Notes (Signed)
Quick Note:    Letter sent  ______

## 2013-07-18 ENCOUNTER — Ambulatory Visit (HOSPITAL_BASED_OUTPATIENT_CLINIC_OR_DEPARTMENT_OTHER): Payer: BC Managed Care – HMO | Admitting: Internal Medicine

## 2013-07-18 ENCOUNTER — Encounter (HOSPITAL_BASED_OUTPATIENT_CLINIC_OR_DEPARTMENT_OTHER): Payer: Self-pay | Admitting: Internal Medicine

## 2013-07-18 ENCOUNTER — Encounter (HOSPITAL_BASED_OUTPATIENT_CLINIC_OR_DEPARTMENT_OTHER): Payer: Self-pay

## 2013-07-18 VITALS — BP 131/82 | HR 62 | Temp 97.9°F | Wt 147.2 lb

## 2013-07-18 DIAGNOSIS — R21 Rash and other nonspecific skin eruption: Secondary | ICD-10-CM

## 2013-07-18 DIAGNOSIS — J111 Influenza due to unidentified influenza virus with other respiratory manifestations: Secondary | ICD-10-CM

## 2013-07-18 DIAGNOSIS — R69 Illness, unspecified: Secondary | ICD-10-CM

## 2013-07-18 MED ORDER — HYDROCORTISONE 2.5 % EX CREA
TOPICAL_CREAM | Freq: Two times a day (BID) | CUTANEOUS | Status: AC
Start: 2013-07-18 — End: 2013-07-28

## 2013-07-18 MED ORDER — GUAIFENESIN-CODEINE 100-10 MG/5ML PO SOLN
10.00 mL | Freq: Three times a day (TID) | ORAL | Status: AC | PRN
Start: 2013-07-18 — End: 2013-07-25

## 2013-07-18 MED ORDER — DIPHENHYDRAMINE HCL 25 MG PO TABS
25.00 mg | ORAL_TABLET | Freq: Every evening | ORAL | Status: AC | PRN
Start: 2013-07-18 — End: 2013-07-25

## 2013-07-18 NOTE — Progress Notes (Signed)
.  With [place] interpreter appointment for 1/28 at 3:50 confirmed.

## 2013-07-18 NOTE — Progress Notes (Signed)
Kayla Garza is a 58 year old female with the following Problems and Medications.    Patient Active Problem List:     Overweight (BMI 25.0-29.9)     Vision loss of left eye, momentary     Vitamin D deficiency     Menopausal syndrome (hot flashes)     Back pain     Vertigo     Ovarian cyst     Allergic rhinitis     Renal anomaly     Hyperthyroidism     Pinguecula of both eyes     Allergic conjunctivitis     Hyperopia with astigmatism and presbyopia     Pituitary dependent Cushing disease     Osteoarthritis of both knees      No current outpatient prescriptions on file.  No current facility-administered medications for this visit.  Review of Patient's Allergies indicates:  No Known Allergies      Rash started last Friday  Rash on arms and lower legs  Also upper chest  Slightly itchy  The blanket is wool   She has never had similar reaction to fabrics or sweaters  She does not normally wear wool sweaters or wool clothing as she finds it uncomfortable    She has been coughing a lot  Husband brought the blanket from work- [employer] social research [employer]  She left room to let him sleep and used this new blanket that neither of them had ever used before  Coughing all night last week end of week  Improved   Able to sleep last few nights  Maybe fever last week- subjective  +sore throat  No ear pain  + vomiting now resolved   no diarrhea      Exam  BP 131/82   Pulse 62   Temp(Src) 97.9 F (36.6 C) (Temperature probe)   Wt 147 lb 3.2 oz (66.769 kg)   BMI 26.08 kg/m2   SpO2 100%  Gen: alert  Ears:  TMs intact without perforation or effusion, external canals normal. No significant cerumenosis noted.  Nose: clear nasal discharge  OP: moist, no lesions, no exudates, no erythema, tonsils not enlarged  Sinuses: non tender frontal, maxillary, ethmoid  Neck: supple with no LAD  Lungs:Chest is clear, no wheezing or rales. Normal symmetric air entry throughout both lung fields.   CV: S1 and S2 normal, no murmurs, clicks, gallops  or rubs. Regular rate and rhythm.  Skin: erythematous papular rash on chest, forearms and both lower legs  None on back and none on abdomen  None on face, palms, soles of feet  The rash on the chest does feel somewhat sandpapery but on arms and legs it does not  Not scaly    A/P  (782.1) Rash  (primary encounter diagnosis)  Comment: contact derm from blanket vs viral rash  Treat symptomatically   Avoid the blanket  If not improving she will call office  Plan: hydrocortisone 2.5 % cream, diphenhydrAMINE         (BENADRYL) 25 MG tablet- discussed that benadryl is sedating      (487.1) Influenza-like illness  Comment: improving although still has residual cough  rx cheratussin for cough that still is bothersome at night  Call if new sx, worse  Discussed this med is sedating    I have reviewed the past medical, surgical, social and family history and updated these sections of EpicCare as relevant. All interim labs, test results, and consult notes were reviewed and discussed with [name]  Da Sherlene Rickel. Medications were reconciled during this visit and a current medication list was given to the patient at the end of the visit.  We discussed the patients current medications. The patient expressed understanding and no barriers to adherence were identified.

## 2013-12-05 ENCOUNTER — Telehealth (HOSPITAL_BASED_OUTPATIENT_CLINIC_OR_DEPARTMENT_OTHER): Payer: Self-pay | Admitting: Registered Nurse

## 2013-12-05 NOTE — Progress Notes (Signed)
Due to the language barrier, the phone call was conducted in [place] with an interpreter. The interpreter's name is Eliane. The interpreter was on the phone during the entire phone call.  Reached pt identified VM - left msg requesting call back to PCP office for assistance in scheduling mammogram, due in May 2015.

## 2013-12-07 ENCOUNTER — Other Ambulatory Visit (HOSPITAL_BASED_OUTPATIENT_CLINIC_OR_DEPARTMENT_OTHER): Payer: Self-pay | Admitting: Internal Medicine

## 2013-12-07 DIAGNOSIS — R921 Mammographic calcification found on diagnostic imaging of breast: Secondary | ICD-10-CM

## 2013-12-12 ENCOUNTER — Encounter (HOSPITAL_BASED_OUTPATIENT_CLINIC_OR_DEPARTMENT_OTHER): Payer: Self-pay | Admitting: Internal Medicine

## 2013-12-12 ENCOUNTER — Ambulatory Visit (HOSPITAL_BASED_OUTPATIENT_CLINIC_OR_DEPARTMENT_OTHER): Payer: Self-pay | Admitting: Internal Medicine

## 2013-12-12 DIAGNOSIS — R928 Other abnormal and inconclusive findings on diagnostic imaging of breast: Secondary | ICD-10-CM

## 2013-12-12 LAB — MA DIAGNOSTIC MAMMO UNILATERAL LEFT WITH CAD

## 2014-03-26 ENCOUNTER — Ambulatory Visit (HOSPITAL_BASED_OUTPATIENT_CLINIC_OR_DEPARTMENT_OTHER): Payer: BC Managed Care – HMO | Admitting: Internal Medicine

## 2014-03-26 ENCOUNTER — Telehealth (HOSPITAL_BASED_OUTPATIENT_CLINIC_OR_DEPARTMENT_OTHER): Payer: Self-pay | Admitting: Internal Medicine

## 2014-03-26 NOTE — Progress Notes (Signed)
Due to the language barrier, the phone call was conducted in [place] with an interpreter. The interpreter's name is Engineer, site. The interpreter was on the phone during the entire phone call.    Called and spoke with pt.  C/o back pain since July.  Was at the [facility] and felt a pull in her back.  Pt concerned as pain has not resolved.  Currently at work and not able to leave for appt today.  Pt scheduled to see provider tomorrow PM in urgent care.

## 2014-03-26 NOTE — Telephone Encounter (Signed)
-----   Message from Wyvonnia Dusky sent at 03/26/2014  2:51 PM EDT -----  Regarding: back pain   Kayla Garza Kayla Garza [id number], 58 year old, female    Calls today:  Sick    What are the symptoms back pain   How long has patient been sick? 1 month since returned from travel to [place]   Would like to see PCP if possible, in the afternoon    What has pt. tried at home no    Person calling on behalf of patient: Spouse    Rod Can NUMBER: [phone]  Best time to call back:   Cell phone:   Other phone:    Patient's language of care: [place]    Patient needs a [place] interpreter.    Patient's PCP: Huntley Estelle (General)

## 2014-03-27 ENCOUNTER — Encounter (HOSPITAL_BASED_OUTPATIENT_CLINIC_OR_DEPARTMENT_OTHER): Payer: Self-pay | Admitting: Physician Assistant

## 2014-03-27 ENCOUNTER — Ambulatory Visit (HOSPITAL_BASED_OUTPATIENT_CLINIC_OR_DEPARTMENT_OTHER): Payer: BC Managed Care – HMO | Admitting: Physician Assistant

## 2014-03-27 ENCOUNTER — Ambulatory Visit: Payer: Self-pay | Admitting: Physician Assistant

## 2014-03-27 VITALS — BP 132/74 | HR 75 | Temp 98.5°F | Wt 146.2 lb

## 2014-03-27 DIAGNOSIS — Z23 Encounter for immunization: Secondary | ICD-10-CM

## 2014-03-27 DIAGNOSIS — M533 Sacrococcygeal disorders, not elsewhere classified: Secondary | ICD-10-CM

## 2014-03-27 DIAGNOSIS — J309 Allergic rhinitis, unspecified: Secondary | ICD-10-CM

## 2014-03-27 DIAGNOSIS — S3992XA Unspecified injury of lower back, initial encounter: Secondary | ICD-10-CM

## 2014-03-27 MED ORDER — IBUPROFEN 600 MG PO TABS
600.00 mg | ORAL_TABLET | Freq: Three times a day (TID) | ORAL | Status: AC | PRN
Start: 2014-03-27 — End: 2014-04-10

## 2014-03-27 MED ORDER — CETIRIZINE HCL 10 MG PO TABS
10.00 mg | ORAL_TABLET | Freq: Every day | ORAL | Status: AC
Start: 2014-03-27 — End: 2014-04-26

## 2014-03-27 MED ORDER — FLUTICASONE PROPIONATE 50 MCG/ACT NA SUSP
1.00 | Freq: Every day | NASAL | Status: AC
Start: 2014-03-27 — End: 2014-04-26

## 2014-03-27 NOTE — Progress Notes (Signed)
Kayla Garza is a 58 year old female patient of Kayla Garza (General) who presents to the clinic today with back pain.    SUBJECTIVE:    The patient presents today with the following concerns:  1. Back pain  2.    Patient Active Problem List:     Overweight (BMI 25.0-29.9)     Vision loss of left eye, momentary     Vitamin D deficiency     Menopausal syndrome (hot flashes)     Back pain     Vertigo     Ovarian cyst     Allergic rhinitis     Renal anomaly     Hyperthyroidism     Pinguecula of both eyes     Allergic conjunctivitis     Hyperopia with astigmatism and presbyopia     Pituitary dependent Cushing disease     Osteoarthritis of both knees      No current outpatient prescriptions on file prior to visit.  No current facility-administered medications on file prior to visit.  All medications reviewed with patient.  Review of Patient's Allergies indicates:  No Known Allergies    Smoking Status: Never Smoker                      Alcohol Use: No              Medical/Surgical/Family History reviewed with the patient.  Relevant changes have been made in 'history' section.    Recent laboratories and imaging reviewed prior to visit.    Review of Systems/HPI:    Back pain:  -pain in tailbone  -if presses on R side of bum, hurts a lot  -pain started in July after going to the gym-just walking on the treadmill, no fall or lifting injury  -pain all the time, worse with sitting down  -hurts a little with walking  -pain sometimes radiates into R leg  -denies bowel/bladder incontinence  -no fevers  -has certain position to sit that offers relief  -takes ibuprofen occasionally, helps a little    All other systems reviewed and negative.    OBJECTIVE:    Vital Signs:  BP 132/74 mmHg   Pulse 75   Temp(Src) 98.5 F (36.9 C) (Oral)   Wt 66.316 kg (146 lb 3.2 oz)   SpO2 100%  Pain Score: 10 (10/10)    Physical Exam:  General: Well developed, well nourished female sitting comfortably  HEENT: Head normocephalic and  atraumatic  Cardiovascular: RRR with normal S1 and S2, no m/r/g appreciated  Respiratory: lungs clear to auscultation bilaterally without wheeze or rhonci, normal work of breathing  Musculoskeletal: moderate TTP of R side of coccyx felt just outside anus.  No spasm or tenderness in muscles in gluteus or paraspinal muscles.  Strength and sensation intact in bilateral lower extremities.  GU: external nontender hemorrhoid noted during low back exam  Extremities: Warm and well perfused  Skin: Warm and dry without concerning lesions  Psychiatric:  Mood and behavior normal with appropriate affect    ASSESSMENT AND PLAN:  Additional plans reviewed with patient and listed below under patient instructions and provided in AVS.    (B51.02HE) Tailbone injury, initial encounter  (primary encounter diagnosis)  Comment: patient with extreme tenderness to palpation at coccyx with pain for about 3 months.  Will send for imaging to r/o fracture and recommend that patient take ibuprofen TID for the next week to help with inflammation.  Referral to  PT for stretching to help with recovery.  Discussed that coccyx injuries unfortunately take a long time to heal.  Will follow imaging and treat as needed.  Plan: XR SACRUM COCCYX MINIMUM 2 VW, REFERRAL TO         PHYSICAL THERAPY ( INT), ibuprofen         (ADVIL,MOTRIN) 600 MG tablet    (J30.9) Allergic rhinitis, unspecified allergic rhinitis type  Comment: patient struggling with allergies, zyrtec and flonase sent to pharmacy  Plan: fluticasone (FLONASE) 50 MCG/ACT nasal spray,         cetirizine (ZYRTEC) 10 MG tablet    (Z23) Need for prophylactic vaccination and inoculation against influenza  Plan: IMMUNIZATION ADMIN SINGLE, RN, PR INFLUENZA         VACCINE QUADRIVALENT 3 YRS PLUS IM (PRIVATE)    HEALTH MAINTENANCE:  AWQ Questionnaire due on 03/29/1976  PHYSICAL EXAM (AGE 33+) due on 02/16/2014  PHQ-9 due on 02/16/2014  ALCOHOL/DRUG SCREENING due on 02/16/2014  FLU VACCINE SEASONAL  completed today    We discussed the patients current medications including proper use and potential side effects. The patient expressed understanding and no barriers to adherence were identified.     1. The patient indicates understanding of these issues and agrees with the plan. Brief care plan is updated and reviewed with the patient.   2. The patient is given an After Visit Summary sheet that lists all medications with directions, allergies, orders placed during this encounter, and follow-up instructions.   3. I reviewed the patient's medical information and medical history   4. I reconciled the patient's medication list and prepared and supplied needed refills.   5. I have reviewed the past medical, family, and social history sections including the medications and allergies.    [name] B. [place], [place]

## 2014-03-27 NOTE — Progress Notes (Signed)
Influenza Vaccine Procedure  March 27, 2014    1. Has the patient received the information for the influenza vaccine? Yes, .    2. Does the patient have any of the following contraindications?  Allergy to eggs? No  Allergic reaction to previous influenza vaccines? No  Any other problems to previous influenza vaccines? No  Paralyzed by Guillain-Barre syndrome?  No  Currently pregnant? No  Current moderate or severe illness? No  Allergy to contact lens solution? No    3. The vaccine has been administered in the usual fashion and the patient/guardian was instructed to wait 20 minutes before leaving the building in the event of an allergic reaction:     Immunization information and current VIS for flu vaccine(s) reviewed; verbal consent given by patient/guardian.

## 2014-03-28 LAB — XR SACRUM COCCYX MINIMUM 2 VIEWS

## 2014-03-28 NOTE — Progress Notes (Signed)
Quick Note:    Please let patient know that XR of her coccyx is completely normal. No sign of fracture to explain her pain. Please encourage her to set up PT and keep [place] posted with how she is feeling.  ______

## 2014-04-01 ENCOUNTER — Telehealth (HOSPITAL_BASED_OUTPATIENT_CLINIC_OR_DEPARTMENT_OTHER): Payer: Self-pay | Admitting: Case Management

## 2014-04-01 NOTE — Progress Notes (Signed)
Due to the language barrier, the phone call was conducted in [place] with an interpreter. The interpreter's name is Engineer, site. The interpreter was on the phone during the entire phone call.    Called and spoke with pt.  She is aware the x-ray was negative for fractures.  She is feeling better with the motrin.  She will get PT scheduled,  Will call if s/s worsening or not improving.

## 2014-04-01 NOTE — Progress Notes (Signed)
Stacy B. Wayne, PA  P [facility]               Please let patient know that XR of her coccyx is completely normal. No sign of fracture to explain her pain. Please encourage her to set up PT and keep [place] posted with how she is feeling.           Call to patient using [place] interpreter. Message left on VM to call clinic

## 2014-04-01 NOTE — Telephone Encounter (Signed)
-----   Message from Gwenevere Abbot sent at 04/01/2014 10:27 AM EDT -----  Regarding: Retuning Call   Contact: [phone]  [facility] Da Kayla Garza [phone], 58 year old, female    Calls today:  Clinical Questions ([place])    Name of person calling [name]   Specific nature of request Patient is returning call from office, please call patient back   Return phone number [phone]  Person calling on behalf of patient: Patient (self)    Rod Can NUMBER: [phone]  Best time to call back:   Cell phone:   Other phone:    Patient's language of care: [place]    Patient does not need an interpreter.    Patient's PCP: Huntley Estelle (General)

## 2014-05-01 ENCOUNTER — Ambulatory Visit (HOSPITAL_BASED_OUTPATIENT_CLINIC_OR_DEPARTMENT_OTHER): Payer: BC Managed Care – HMO | Admitting: Rehabilitative and Restorative Service Providers"

## 2014-05-01 DIAGNOSIS — M533 Sacrococcygeal disorders, not elsewhere classified: Secondary | ICD-10-CM | POA: Insufficient documentation

## 2014-05-01 NOTE — Progress Notes (Signed)
PT Evaluation    HPI: Pt is a 58y/o female who presents to PT with tailbone pain starting while walking on a treadmill in July 2015. Pt denies falling or any other overt injury to the tailbone. Pt reports pain has improved since July. Pt reports pain with direct pressure to the tailbone, like sitting. Pt reports pain is better when standing. Pt denies pain or numbness radiating down B LE. Pt denies bowel or bladder incontinence, nor pain with using the bathroom.  Pt reports h/o low back pain prior to this onset of pain.    Pt reports difficulty bending all the way down to cut toe nails due to pain. Pt reports pain is currently 10/10. Pt sitting in no apparent distress throughout PT evaluation.     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 No pain reported from work.   Pt reports that she goes to the gym 3x/week - preferring to walk on the treadmill.     Pt's goal of PT is to decrease tailbone pain.      05/01/14 [place] Yes  (via telephone)   Evaluation Type   Evaluation Type Initial Evaluation   Rehab Discipline   Rehab Discipline PT   Visit   Visit number 1   POC Due date 06/12/14   Pain   Pain Score 10   (10/10 pain currently; pt in no apparent distress throughout PT exam)   Patient Stated Goals   Patient stated goals decrease tailbone pain   Posture   Posture assessment WFL   Spine Assessment   Spine Assessment Lumbar   Lumbar Assessment   Flexion 75%   Extension 100%  (low back pain)   R Side Bend 25 deg  (L low back pain)   L Side Bend 30 deg   Clinical Special Tests   Special Tests Yes   Lumbar/Sacroiliac results   L Slump Negative   R Slump Negative   L SLR  Negative   R SLR  Negative   Repetitive Flexion Negative   Repetitive Extension Negative   L Faber Negative   R Faber Negative   L SI Compression/Distraction Negative   R SI Compression/Distraction Negative   Left HIP results   L Hamstring  Negative  (50 deg, local HS soreness)   Right HIP results   R Hamstring  Negative  (50 deg, local HS soreness)   Sensation   Light Touch No apparent deficits   Functional Mobility   Gait WNL   Transfers WNL   Functional Activities/Ergonomics   Functional Activities Assessment/Ergonomics X   Recreation goes to the gym 3x/week - treadmill walking   Occupation full-time ironing/pressing at [employer]: standing with no pain   Other (comments) abdominal strength grossly 3+/5   Dressing Independent  (pain with full bending to clip toe nails)   Bladder Continent  (no pain)   Bowel Continent  (no pain)   Palpation   Tenderness to Palpation TTP coccyx    Spine Joint Mobility (0-6 scale)   Spine Mobility assessment  Yes   Lumbar   Passive Accessory Intervertebral Movement (PAIVM) 3/6   Neurological   Neurological assessment  Yes   Dermatomes   RLE Sensation WNL   LLE Sensation WNL   Myotomes   RLE Strength WNL   LLE Myotomes Strength WNL   Reflexes   RLE Reflexes within normal limits (2+)  LLE Reflexes within normal limits (2+)   Patient Education   What was taught? PT POC; HEP as above; performing any exercise at the gym that she feels comfortable with that does not provoke pain   Method Verbal;Demo;Written   Patient comprehension Yes     Physical Therapy Plan of Care    IP:JASNKN Engelbart,MD (General)  Referring Provider: Bjorn Loser, PA  Diagnosis: Coccyx pain  (primary encounter diagnosis)    Assessment/Objective Findings:   Patient is a 58 year old female who presents to PT with tailbone pain starting in July 2015 while walking on a treadmill, per pt report. Pt denies overt injury or fall. Pt reports pain primarily with direct pressure on the tailbone, such as sitting, especially with reaching forward as if to reach toes. Pt reports h/o low back pain prior to this onset. Pt with slight decrease in lumbar ROM with pain into extension and R side bend. Pt with tenderness over the coccyx to palpation. Pt with decreased  core stabilizer strength. Pt reports limitations with sitting due to pain. Pt will benefit from skilled PT to improve core stabilizer and pelvic floor strength to help decrease tailbone pain.  The prescribed treatment plan of care is medically necessary.    Co-morbidities of B knee OA, hyperthyroidism, Cushing's were identified and taken into considerations of plan of care.    Physical Therapy Goals: 4 weeks  1. Pt will be compliant with HEP.   2. Pt will demonstrate increased core stabilizer strength by 1/2 MMT grade.   3. Pt will be able to sit with <7/10 pain.  4. Pt will demonstrate independent body mechanics with reaching tasks  5. Pt will be independent with HEP.     Treatment Plan:  ** PT Eval (CPT 97001)  ** Stretching/ROM Exercise [phone])  ** Therapeutic Exercise (CPT [phone])  ** Home Exercise Program (CPT [phone])  ** Neuromuscular Re-education (CPT [id number])  ** Joint Mobilization (CPT 97140)  ** Soft Tissue Mobilization (CPT 97140)  ** Hot/Cold Rx (CPT 97010)  ** Functional Activities (CPT 97530)  ** Patient Education (CPT [phone])    Recommend Physical Therapy be continued 1 times per week for 4 weeks.  The rehabilitation potential for this patient is fair    Patient Regis Bill is aware of attendance policy: Yes  Plan of care discussed with Patient/Family: Yes  Patient goals reviewed and incorporated in plan of care: Yes  Patient/Family agrees with plan of care: Yes  Patient/Family education: Yes  Does patient feel safe at home: Yes    Egbert Garibaldi PT, DPT

## 2014-05-01 NOTE — Progress Notes (Signed)
PT Treatment Flowsheet     05/01/14 1650   Language Information   Language of Care [place]   Interpreter Yes  (via telephone)   [facility] PT   Visit   Visit number 1   POC Due date 06/12/14   Time Calculation   Start Time 1615   Stop Time 1700   Time Calculation (min) 45 min   Pain   Pain Score 10   (10/10 pain currently; pt in no apparent distress throughout PT exam)   Ther Exercise   Exercise HEP: cat-camel   Reps 30   Sets 2   Ther Exercise 2   Exercise HEP; bird dog LE only    Reps 2 20   Sets 2 2   Patient Education   What was taught? PT POC; HEP as above; performing any exercise at the gym that she feels comfortable with that does not provoke pain   Method Verbal;Demo;Written   Patient comprehension Yes

## 2014-05-02 NOTE — Progress Notes (Signed)
I certify that the documented Treatment Plan is reasonable and necessary.    05/02/2014  Kayla Raynor B. [place], [place]

## 2014-05-07 ENCOUNTER — Ambulatory Visit (HOSPITAL_BASED_OUTPATIENT_CLINIC_OR_DEPARTMENT_OTHER): Payer: BC Managed Care – HMO | Admitting: Endocrinology

## 2014-05-07 ENCOUNTER — Encounter (HOSPITAL_BASED_OUTPATIENT_CLINIC_OR_DEPARTMENT_OTHER): Payer: Self-pay | Admitting: Endocrinology

## 2014-05-07 VITALS — BP 104/62 | HR 62 | Wt 147.0 lb

## 2014-05-07 DIAGNOSIS — J392 Other diseases of pharynx: Secondary | ICD-10-CM

## 2014-05-07 DIAGNOSIS — E0590 Thyrotoxicosis, unspecified without thyrotoxic crisis or storm: Secondary | ICD-10-CM

## 2014-05-07 DIAGNOSIS — R0989 Other specified symptoms and signs involving the circulatory and respiratory systems: Secondary | ICD-10-CM

## 2014-05-07 LAB — THYROID SCREEN TSH REFLEX FT4: THYROID SCREEN TSH REFLEX FT4: 2.91 u[IU]/mL (ref 0.358–3.740)

## 2014-05-07 NOTE — Progress Notes (Signed)
Patient feels safe at home.

## 2014-05-07 NOTE — Progress Notes (Signed)
This is a 58 yo woman who returns for annual f/u of a thyroid disorder:  9/13 scalp hair loss and tired legs: TSH 0.17  10/13 ultrasound heterogeneous hypervascular; no nodules; right and left lobes both 4 cm; thyroid scan non-focal  Methimazole 11/3-2/14   2/14 TSI 87;  TPO 13; 50 g irregular goiter    Due to the language barrier, the office visit was conducted in [place] with an interpreter.  The interpreter was on the phone during the entire visit.    I have reviewed the patient's medical, social, family history and meds in detail and updated the computerized patient record.         Review of Systems   Constitutional: Negative.  Negative for fever, chills, weight loss and malaise/fatigue.   HENT: Negative for sore throat.         Feels lump inside throat on right; also reports intermittent hoarseness   Eyes: Negative for blurred vision and double vision.   Respiratory: Negative for cough and wheezing.    Cardiovascular: Negative for chest pain, palpitations and leg swelling.   Gastrointestinal: Negative for heartburn, nausea, vomiting and abdominal pain.   Musculoskeletal:        Pain in area of coccyx; seeing PT; xray negative   Skin: Negative for rash.   Neurological: Negative.  Negative for headaches.   Psychiatric/Behavioral: Negative.     BP 104/62 mmHg   Pulse 62   Wt 66.679 kg (147 lb)   SpO2 100%    Physical Exam   Constitutional: She is oriented to person, place, and time. She appears well-developed and well-nourished. No distress.   HENT:   Head: Normocephalic and atraumatic.   Mouth/Throat: Oropharynx is clear and moist. No oropharyngeal exudate.   Oropharynx without injection; there is small raised 3 mm lesion on right tonsil that patient points to as the lump she feels   Eyes: Conjunctivae and EOM are normal.   No proptosis, lid lag or stare   Neck: Normal range of motion. Neck supple. Thyromegaly present.   50 g firm slightly irregular goiter   Cardiovascular: Normal rate, regular rhythm and  normal heart sounds.  Exam reveals no gallop and no friction rub.    No murmur heard.  Pulmonary/Chest: Effort normal and breath sounds normal. No respiratory distress. She has no wheezes. She has no rales.   Musculoskeletal: Normal range of motion. She exhibits no edema.   No resting tremor of outstretched hands   Lymphadenopathy:     She has no cervical adenopathy.   Neurological: She is alert and oriented to person, place, and time.   Skin: Skin is warm and dry. She is not diaphoretic.   Psychiatric: She has a normal mood and affect.     THYROID FLOWSHEET Latest Ref Rng 05/15/2013   Thyroid Screen TSH 0.358 - 3.740 uIU/mL 2.660   Thyroid Stim Hormone 0.34 - 5.60 uIU/mL    Free Thyroxine 0.55 - 1.12 ng/dl    Anti Microsomal Antibody (TPO) 0 - 34 IU/mL    TSI 0 - 139 %      ASSESSMENT  1. Goiter, hx of sc hyperthyroidism, no change on exam and prior imaging negative.  2. Tonsillar lesion, painful per patient, hoarseness    RECOMMEND:   1. TSH sent  2. Referred to ENT  3. F/u 1 year if remains well.

## 2014-05-13 ENCOUNTER — Encounter (HOSPITAL_BASED_OUTPATIENT_CLINIC_OR_DEPARTMENT_OTHER): Payer: Self-pay | Admitting: Endocrinology

## 2014-05-21 ENCOUNTER — Encounter (HOSPITAL_BASED_OUTPATIENT_CLINIC_OR_DEPARTMENT_OTHER): Payer: Self-pay | Admitting: Otolaryngology

## 2014-05-22 ENCOUNTER — Ambulatory Visit (HOSPITAL_BASED_OUTPATIENT_CLINIC_OR_DEPARTMENT_OTHER): Payer: BC Managed Care – HMO | Admitting: Rehabilitative and Restorative Service Providers"

## 2014-06-19 ENCOUNTER — Ambulatory Visit (HOSPITAL_BASED_OUTPATIENT_CLINIC_OR_DEPARTMENT_OTHER): Payer: BC Managed Care – HMO | Admitting: Otolaryngology

## 2014-07-03 ENCOUNTER — Encounter (HOSPITAL_BASED_OUTPATIENT_CLINIC_OR_DEPARTMENT_OTHER): Payer: Self-pay | Admitting: Otolaryngology

## 2014-07-03 ENCOUNTER — Ambulatory Visit (HOSPITAL_BASED_OUTPATIENT_CLINIC_OR_DEPARTMENT_OTHER): Payer: BC Managed Care – HMO | Admitting: Otolaryngology

## 2014-07-03 VITALS — BP 119/78 | HR 65

## 2014-07-03 DIAGNOSIS — R490 Dysphonia: Secondary | ICD-10-CM

## 2014-07-03 DIAGNOSIS — K219 Gastro-esophageal reflux disease without esophagitis: Secondary | ICD-10-CM

## 2014-07-03 MED ORDER — OMEPRAZOLE 20 MG PO CPDR
40.0000 mg | DELAYED_RELEASE_CAPSULE | Freq: Every day | ORAL | Status: DC
Start: 2014-07-03 — End: 2015-01-01

## 2014-07-03 MED ORDER — RANITIDINE HCL 300 MG PO CAPS
300.0000 mg | ORAL_CAPSULE | Freq: Every evening | ORAL | Status: DC
Start: 2014-07-03 — End: 2015-01-01

## 2014-07-03 NOTE — Progress Notes (Signed)
HPI: 59 year old female Kayla Garza presents for assessment of globus sensation    [place] Interpreter    This has been present for 3-4 months.  It is present all the time.  She denies throat clearing.  She denies any odynophagia or dysphagia.  She denies any SOB. She had intermittent dysphonia.  It is not dependent on use, it fluctuates randomly. She does get heartburn to spicy, fried, or tomato sauces. She does not take medication for this. She is followed by Dr. Hurshel Keys for enlarged thyroid.       Caffeine: one coffee in AM  Water: 1-2 bottles.   Nonsmoker.     Past medical, social and family history reviewed in EPIC.    Review of Systems -   Fever No  Cough No  Easy Bruising No  Blurred Vision No  Palpitations No  Heartburn Yes  Stiff Neck No  Headache No  Skin Lesions of the Face No    EXAM  General: WD WN female with a normal voice.  Face: No lesions.  Facial Strength: Normal and symmetric.  Eyes: PERRLA and EOMI.  Ear R: No lesions. Cerumen: none / minimal. Canal clear. TM clear.   Ear L:  No lesions. Cerumen: none / minimal. Canal clear. TM clear.  Nose:  Septum midline.  Turbinates Hypertrophic:  right.  No polyps or masses.  Nasopharynx:  clear, no masses    Oral Cavity/Oropharynx:  Mucous membranes moist.  Tonsils 0+.  Teeth in upper denture and lower in moderate to poor condition.  Tongue no lesions.  Hypopharynx: Base of tongue no masses. Pyriform sinuses clear.  Larynx: Epiglottis no edema or lesions. Vocal cords: See FOE.  Neck:  Trachea midline.  No palpable masses.  Thyroid: Normal to palpation.  Lymph:  No palpable nodes.  Subman glands:  Normal size, non-tender.  Parotids: Normal size, no masses or nodes.      FOE:  Indications:  Hx of globus sensation and dysphonia, need to evaluate laryngeal and pharyngeal structures not easily seen on indirect laryngoscopy  Nasal cavity was anesthetized with afrin and lidocaine. Scope was passed in Left nasal passage.  Nasopharynx:  NML masses or lesions, ET orifices NML  OP:  No/+ cobblestoning, no erythema, no masses or lesions on pharyngeal walls  BOT :  clear no asymmetry, no masses or lesions  Vallecula: clear, no masses or lesions  Epiglottis:  no erythema, no edema, No masses or lesions  AE Folds/Arytenoids:  + Erythema,  + Edema, No masses or lesions  FVF: No masses or lesions, No erythema, + Edema  TVF: No masses or lesions, No erythema, + Edema, mobile B/L  Pyriform Sinuses: No asymmetry, no pooling of secretions, No masses or lesions  Post Cricoid:  + Erythema,  ++ Edema, No masses or lesions      A/P  59 year old female presents for assessment of globus sensation and dysphonia. She has history and exam findings consistent with laryngopharyngeal reflux disease, LPR.  Discussed that this is a chronic condition that will take some time to reverse changes in throat with therapy.  Discussed therapy consisted of diet and lifestyle changes along with medications.      Start Omeprazole 40 mg PO 30-45 min before first meal of day    Start Ranitidine at bedtime    Discussed diet and lifestyle modifications    Discussed limiting caffeine and increasing water intake    Return in 3 months for reassessment.

## 2014-07-15 ENCOUNTER — Encounter (HOSPITAL_BASED_OUTPATIENT_CLINIC_OR_DEPARTMENT_OTHER): Payer: Self-pay | Admitting: Internal Medicine

## 2014-07-15 DIAGNOSIS — K219 Gastro-esophageal reflux disease without esophagitis: Secondary | ICD-10-CM | POA: Insufficient documentation

## 2014-10-02 ENCOUNTER — Ambulatory Visit (HOSPITAL_BASED_OUTPATIENT_CLINIC_OR_DEPARTMENT_OTHER): Payer: BC Managed Care – HMO | Admitting: Otolaryngology

## 2014-10-02 DIAGNOSIS — K219 Gastro-esophageal reflux disease without esophagitis: Secondary | ICD-10-CM

## 2014-10-02 DIAGNOSIS — R490 Dysphonia: Secondary | ICD-10-CM

## 2014-10-02 NOTE — Progress Notes (Signed)
HPI: 59 year old female Kayla Garza is a followup for LPR.      [place] interpreter    She has been using Omeprazole and Ranitidine daily and has been adherent to diet and lifestyle modifications.  Her voice is 100% improved and she no longer has the globus sensation.  When she presses on the submandibular area on the right side she can feel something inside her throat but does not feel anything if she is not pressing on it.  She denies dysphagia.  She has been symptom-free in her throat for at least 2 months.  Heartburn resolved entirely.    Past medical, social and family history reviewed in EPIC.    Review of Systems -   Fever No  Cough No  Wheeze No  Easy Bruising No  Blurred Vision No  Palpitations No  Heartburn No  Stiff Neck No  Headache No  Skin Lesions of the Face No    EXAM  General: WD WN female with a normal voice.  Face: No lesions  Facial Strength: Normal and symmetric.  Eyes: PERRLA and EOMI.  Ear R: No lesions. Cerumen: none / minimal. Canal clear. TM clear.  Ear L:  No lesions. Cerumen: none / minimal. Canal clear. TM clear.  Nose:  Septum midline.  Turbinates normal size.  No polyps or masses.  Nasopharynx:  CNV    Oral Cavity/Oropharynx:  Mucous membranes moist.  Tonsils 0+.  Teeth in good condition.  Tongue no lesions.  Hypopharynx: Base of tongue no masses. Pyriform sinuses clear.  Larynx: Epiglottis no edema or lesions. Vocal cords: CNV.  Neck:  Trachea midline.  No palpable masses.  Thyroid: Normal to palpation.  Lymph:  No palpable nodes.  Subman glands:  Normal size, non-tender, reproduction of a sensation in the left side of her throat with palpation of right submandibular gland.   Parotids: Normal size, no masses or nodes.    A/P  59 year old female with LPR and significantly improved with medical therapy.  Since she has been symptom free x 2 months will stop ranitidine and continue Omeprazole.  F/U in 3 months and if continues to do well will try to stop all  medication.    Discussed no palpable abnormality of the right submandibular gland and this is not causing any pain or discomfort.  Will be happy to recheck this at the next visit.

## 2014-10-14 NOTE — Progress Notes (Signed)
59 yo F presents for reassessment of LPR.  She has been on omeprazole and ranitidine along with diet and lifestyle changes.  She is doing much better. She denies dysphonia and globus sensation has resolved.  She denies heartburn.    Exam: PC limited view showing some edema    LPR improving on medical therapy. Stop ranitidine and continue omeprazole and diet and lifestyle changes. Return in 3 months.     I have personally seen, interviewed, and examined patient.  I agree with Ronny Bacon History, Physical Exam, and Assessment and Plan

## 2014-11-25 ENCOUNTER — Other Ambulatory Visit (HOSPITAL_BASED_OUTPATIENT_CLINIC_OR_DEPARTMENT_OTHER): Payer: Self-pay

## 2014-11-25 NOTE — Telephone Encounter (Signed)
Planned Care Outreach     Call made to [facility] to schedule an appointment for a breast cancer screening (mammogram) after 03/25/2015 .  Darlene (name) present as interpreter in [place].  Patient (self) did not answer, this team member left a message asking for a call back.    I have completed the following communication and reminder steps: Patient notified by telephone  Harlon Flor, [place], 11/25/2014, 2:23 PM

## 2014-12-25 ENCOUNTER — Other Ambulatory Visit (HOSPITAL_BASED_OUTPATIENT_CLINIC_OR_DEPARTMENT_OTHER): Payer: Self-pay | Admitting: Internal Medicine

## 2014-12-25 DIAGNOSIS — Z1239 Encounter for other screening for malignant neoplasm of breast: Secondary | ICD-10-CM

## 2015-01-01 ENCOUNTER — Ambulatory Visit (HOSPITAL_BASED_OUTPATIENT_CLINIC_OR_DEPARTMENT_OTHER): Payer: BC Managed Care – HMO | Admitting: Otolaryngology

## 2015-01-01 ENCOUNTER — Ambulatory Visit (HOSPITAL_BASED_OUTPATIENT_CLINIC_OR_DEPARTMENT_OTHER): Payer: Self-pay | Admitting: Internal Medicine

## 2015-01-01 ENCOUNTER — Encounter (HOSPITAL_BASED_OUTPATIENT_CLINIC_OR_DEPARTMENT_OTHER): Payer: Self-pay | Admitting: Otolaryngology

## 2015-01-01 DIAGNOSIS — Z1239 Encounter for other screening for malignant neoplasm of breast: Secondary | ICD-10-CM

## 2015-01-01 DIAGNOSIS — J358 Other chronic diseases of tonsils and adenoids: Secondary | ICD-10-CM

## 2015-01-01 DIAGNOSIS — K219 Gastro-esophageal reflux disease without esophagitis: Secondary | ICD-10-CM

## 2015-01-01 DIAGNOSIS — J309 Allergic rhinitis, unspecified: Secondary | ICD-10-CM

## 2015-01-01 DIAGNOSIS — R490 Dysphonia: Secondary | ICD-10-CM

## 2015-01-01 MED ORDER — OMEPRAZOLE 20 MG PO CPDR
20.0000 mg | DELAYED_RELEASE_CAPSULE | Freq: Every day | ORAL | 6 refills | Status: DC
Start: 2015-01-01 — End: 2015-05-07

## 2015-01-01 MED ORDER — IPRATROPIUM BROMIDE 0.06 % NA SOLN
2.0000 | Freq: Four times a day (QID) | NASAL | 5 refills | Status: DC
Start: 2015-01-01 — End: 2015-05-07

## 2015-01-01 NOTE — Addendum Note (Signed)
Addended by: Jilda Roche on: 01/01/2015 04:16 PM     Modules accepted: Orders

## 2015-01-01 NOTE — Progress Notes (Signed)
HPI: 59 year old female Kayla Garza is a followup for LPR    [place] Interpreter    Patient was last seen 3 months ago.  She has been on omeprazole along with diet and lifestyle changes.  She stopped ranitidine and then restarted 1 month ago. She noticed these balls coming out of her throat.     She is doing well. She denies any globus sensation, throat clearing, dysphonia, dysphagia, or frequent throat clear.  She reports only issues is smelly white balls. These come out intermittently.      She also says she has sinusitis. She has facial pressure that comes and goes. She has a burning sensation in her face when she bends her head down. She has nasal congestion that is present at night. Her sense of smell is normal.  She has intermittent rhinorrhea but no PND.  She was on flonase in the past but stopped because it was making her thyroid bigger. She has occasional seasonal symptoms in the spring.  She does not want to try flonase again even though it helped a lot.     Past medical, social and family history reviewed in EPIC.    Review of Systems -   Fever No  Cough No  Easy Bruising No    EXAM  General: WD WN female with a normal voice.  Face: No lesions  Facial Strength: Normal and symmetric.  Eyes: PERRLA and EOMI.  Ear R: No lesions. Cerumen: none / minimal. Canal clear. TM clear.  Ear L:  No lesions. Cerumen: none / minimal. Canal clear. TM clear.  Nose:  Septum midline.  Turbinates normal size.  No polyps or masses.  Nasopharynx:  CNV.    Oral Cavity/Oropharynx:  Mucous membranes moist.  Tonsils 0+, cryptic, limited view due to tongue.  Teeth in good condition.  Tongue no lesions.  Hypopharynx: Base of tongue no masses. Pyriform sinuses clear.  Larynx: Epiglottis no edema or lesions. Vocal cords: mobile with no lesions or polyps. Minimal PC edema  Neck:  Trachea midline.  No palpable masses.  Thyroid: Normal to palpation.  Lymph:  No palpable nodes.  Subman glands:  Normal size,  non-tender  Parotids: Normal size, no masses or nodes.    A/P  59 year old female presents for reassessment of LPR.  Her LPR is well controlled and she is more concerned about tonsilliths.  Discussed with her that the acid reducing medications will not help with this.  Recommend:     gargling after meals    Direct pressure once a day.    LPR: stop ranitidine and cut down omeprazole to 20 mg daily    She has some symptoms concerning for chronic sinusitis.  She mentioned issues with nasal steroid and thyroid issues.  Recommend    Trial of atrovent.    bring box with warning for thyroid issues with nasal steroid.

## 2015-01-02 LAB — MA SCREENING MAMMO BILATERAL WITH CAD

## 2015-01-14 DIAGNOSIS — J358 Other chronic diseases of tonsils and adenoids: Secondary | ICD-10-CM | POA: Insufficient documentation

## 2015-01-14 DIAGNOSIS — K219 Gastro-esophageal reflux disease without esophagitis: Secondary | ICD-10-CM | POA: Insufficient documentation

## 2015-04-09 ENCOUNTER — Encounter (HOSPITAL_BASED_OUTPATIENT_CLINIC_OR_DEPARTMENT_OTHER): Payer: Self-pay | Admitting: Otolaryngology

## 2015-04-09 ENCOUNTER — Ambulatory Visit (HOSPITAL_BASED_OUTPATIENT_CLINIC_OR_DEPARTMENT_OTHER): Payer: Commercial Managed Care - HMO | Admitting: Otolaryngology

## 2015-04-09 DIAGNOSIS — J358 Other chronic diseases of tonsils and adenoids: Secondary | ICD-10-CM

## 2015-04-09 DIAGNOSIS — K219 Gastro-esophageal reflux disease without esophagitis: Secondary | ICD-10-CM

## 2015-04-09 DIAGNOSIS — J310 Chronic rhinitis: Secondary | ICD-10-CM

## 2015-04-09 MED ORDER — FLUNISOLIDE 25 MCG/ACT (0.025%) NA SOLN
2.0000 | Freq: Two times a day (BID) | NASAL | 6 refills | Status: DC
Start: 2015-04-09 — End: 2016-09-20

## 2015-04-09 NOTE — Progress Notes (Signed)
HPI: 59 year old female Kayla Garza is a followup for LPR, tonsilliths, and chronic sinusitis    Patient was last seen on 3 months ago.  She is on diet and lifestyle changes, gargling for tonsilliths and providing direct pressure    LPR well controlled. No issues with just diet and lifestyle changes. She denies globus sensation, throat clearing, or heartburn.  She denies dysphonia.     Tonsilliths controlled with gargling and occasional direct pressure.     She still gets sinus pressure and pain that comes and goes. She still has intermittent congestion that is present at bedtime.  She has intermittent rhinorrhea.  She only did one small bottle and was told by pharmacy there were no refills.  She used the bottle up in 4-5 days. She is unsure if this helped.     LAST visit:     She also says she has sinusitis. She has facial pressure that comes and goes. She has a burning sensation in her face when she bends her head down. She has nasal congestion that is present at night. Her sense of smell is normal.  She has intermittent rhinorrhea but no PND.  She was on flonase in the past but stopped because it was making her thyroid bigger. She has occasional seasonal symptoms in the spring.  She does not want to try flonase again even though it helped a lot.         Past medical, social and family history reviewed in EPIC.    Review of Systems -   Fever No  Cough No  Easy Bruising No    EXAM  General: WD WN female with a normal voice.  Face: No lesions  Facial Strength: Normal and symmetric.  Eyes: PERRLA and EOMI.  Ear R: No lesions. Cerumen: none / minimal. Canal clear. TM clear.  Ear L:  No lesions. Cerumen: none / minimal. Canal clear. TM clear.  Nose:  Septum midline.  Turbinates normal size.  No polyps or masses.  Nasopharynx:  CNV.    Oral Cavity/Oropharynx:  Mucous membranes moist.  Tonsils 0+.  Teeth in moderate condition, upper denture.  Tongue no lesions.  Neck:  Trachea midline.  No  palpable masses.  Thyroid: Normal to palpation.  Lymph:  No palpable nodes.  Subman glands:  Normal size, non-tender  Parotids: Normal size, no masses or nodes.    A/P  59 year old female presents for reassessment of LPR, tonsilliths, and chronic sinusitis. Her LPR and tonsilliths are controled with conservative measures.  Recommend:     Continue diet and lifestyle changes    Continue gargling and direct pressure    She has sinus pressure and did not have adequate medical therapy due to an issue with prescription at the pharmacy.  Recommend:     Start flunisolide twice a day    Return in 3 months

## 2015-05-07 ENCOUNTER — Encounter (HOSPITAL_BASED_OUTPATIENT_CLINIC_OR_DEPARTMENT_OTHER): Payer: Self-pay | Admitting: Internal Medicine

## 2015-05-07 ENCOUNTER — Ambulatory Visit (HOSPITAL_BASED_OUTPATIENT_CLINIC_OR_DEPARTMENT_OTHER): Payer: Commercial Managed Care - HMO | Admitting: Internal Medicine

## 2015-05-07 VITALS — BP 135/85 | HR 68 | Temp 97.6°F | Wt 154.0 lb

## 2015-05-07 DIAGNOSIS — Z131 Encounter for screening for diabetes mellitus: Secondary | ICD-10-CM

## 2015-05-07 DIAGNOSIS — R1013 Epigastric pain: Secondary | ICD-10-CM

## 2015-05-07 DIAGNOSIS — R0981 Nasal congestion: Secondary | ICD-10-CM

## 2015-05-07 DIAGNOSIS — E559 Vitamin D deficiency, unspecified: Secondary | ICD-10-CM

## 2015-05-07 DIAGNOSIS — E0590 Thyrotoxicosis, unspecified without thyrotoxic crisis or storm: Secondary | ICD-10-CM

## 2015-05-07 DIAGNOSIS — M79605 Pain in left leg: Secondary | ICD-10-CM

## 2015-05-07 DIAGNOSIS — M79604 Pain in right leg: Secondary | ICD-10-CM

## 2015-05-07 DIAGNOSIS — Z0000 Encounter for general adult medical examination without abnormal findings: Secondary | ICD-10-CM

## 2015-05-07 DIAGNOSIS — L659 Nonscarring hair loss, unspecified: Secondary | ICD-10-CM

## 2015-05-07 DIAGNOSIS — Z23 Encounter for immunization: Secondary | ICD-10-CM

## 2015-05-07 LAB — VITAMIN D,25 HYDROXY: VITAMIN D,25 HYDROXY: 33 ng/mL (ref 30.0–100.0)

## 2015-05-07 LAB — FERRITIN: FERRITIN: 13 ng/mL (ref 8–252)

## 2015-05-07 LAB — CBC WITH PLATELET
HEMATOCRIT: 40.9 % (ref 34.1–44.9)
HEMOGLOBIN: 12.9 g/dL (ref 11.2–15.7)
MEAN CORP HGB CONC: 31.5 g/dL (ref 31.0–37.0)
MEAN CORPUSCULAR HGB: 27.9 pg (ref 26.0–34.0)
MEAN CORPUSCULAR VOL: 88.3 fL (ref 80.0–100.0)
MEAN PLATELET VOLUME: 10.4 fL (ref 8.7–12.5)
PLATELET COUNT: 321 10*3/uL (ref 150–400)
RBC DISTRIBUTION WIDTH STD DEV: 46.1 fL (ref 35.1–46.3)
RBC DISTRIBUTION WIDTH: 14.3 % (ref 11.5–14.3)
RED BLOOD CELL COUNT: 4.63 M/uL (ref 3.90–5.20)
WHITE BLOOD CELL COUNT: 5.4 10*3/uL (ref 4.0–11.0)

## 2015-05-07 LAB — TSH (THYROID STIMULATING HORMONE): TSH (THYROID STIM HORMONE): 1.66 u[IU]/mL (ref 0.358–3.740)

## 2015-05-07 LAB — HIGH DENSITY LIPOPROTEIN: HIGH DENSITY LIPOPROTEIN: 59 mg/dL (ref 40–?)

## 2015-05-07 LAB — LOW DENSITY LIPOPROTEIN DIRECT: LOW DENSITY LIPOPROTEIN DIRECT: 112 mg/dL (ref 0–189)

## 2015-05-07 LAB — CHOLESTEROL: Cholesterol: 187 mg/dL (ref 0–239)

## 2015-05-07 MED ORDER — RANITIDINE HCL 150 MG PO TABS
150.00 mg | ORAL_TABLET | Freq: Two times a day (BID) | ORAL | 3 refills | Status: AC
Start: 2015-05-07 — End: 2015-08-07

## 2015-05-07 MED ORDER — IPRATROPIUM BROMIDE 0.06 % NA SOLN
2.0000 | Freq: Four times a day (QID) | NASAL | 5 refills | Status: DC
Start: 2015-05-07 — End: 2016-09-20

## 2015-05-07 NOTE — Progress Notes (Signed)
Influenza Vaccine Procedure  May 07, 2015    1. Has the patient received the information for the influenza vaccine? Yes    2. Does the patient have any of the following contraindications?  Allergy to eggs? No  Allergic reaction to previous influenza vaccines? No  Any other problems to previous influenza vaccines? No  Paralyzed by Guillain-Barre syndrome?  No  Current moderate or severe illness? No  Allergy to contact lens solution? No    3. The vaccine has been administered in the usual fashion.     Immunization information reviewed. Current VIS reviewed and given to patient/ guardian. Verbal assent obtained from patient/ guardian.  See immunization/Injection module or chart review for date of publication and additional information. Verbal assent obtained from patient/guardian. Comfort measures for possible side effects reviewed.

## 2015-05-07 NOTE — Progress Notes (Signed)
Kayla Garza is a 59 year old female here for CPE      Stomach pains  She is no longer on PPI or zantac which were prescribed for LPR by ENT  Epigastric burning  When she was on the PPI and zantac it did not help the stomach pain  This pain comes and goes  She had endoscopy in [place] years ago  Found bacteria which was treated about 10 years ago  She had negative stool h pylori in 2008  She has no bloating in abdomen since she was treated for the infection 10 years ago  +nausea all the time in the past few weeks  No blood in stools  Not really worse when she drinks coffee  Has 2 cups coffee daily  No nsaids  No soda or tea or spicy foods    Leg pain  Both sides  Hurts at night  Her lower anterior legs  From knees to the feet  Knees are not swollen and the knees are not really where she has the pain  It is below her knees  She works at [employer]  No pain when at work  She is still taking vitamin D  She has a known history of knee OA  Wears supportive sneakers    Sinus problem  Using flunisolide- started 10/19 by ENT  When she bends forward she feels stinging sensation in her nose  She has no nasal dc  No sinus pain or pressure in face  No fevers  Was on nasal atrovent in the past for same sx and helped somewhat    Hair loss  Curious about thyroid function  No patches of hair loss, just thinning    Patient Active Problem List:     Overweight (BMI 25.0-29.9)     Vision loss of left eye, momentary     Vitamin D deficiency     Menopausal syndrome (hot flashes)     Ovarian cyst     Allergic rhinitis     Renal anomaly     Hyperthyroidism     Pinguecula of both eyes     Allergic conjunctivitis     Hyperopia with astigmatism and presbyopia     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     Allergies:  Review of Patient's Allergies indicates:  No Known Allergies    Health Maintenance:  TDAP/TD VACCINE(1 - Tdap) due on 10/31/2007  PHYSICAL EXAM (AGE 86+) due on 02/16/2014  LIPID SCREENING due on 08/21/2014  INFLUENZA VACCINE(1) due on 02/20/2015  HEALTH CARE PROXY due on 01/07/2016  AWQ Questionnaire due on 05/06/2016  MAMMOGRAPHY due on 12/31/2016  COLONOSCOPY due on 11/16/2020  HIV SCREENING Completed  HEP B HIGH RISK VACCINE EVAL (ONCE) Completed  HEP C SCREEN (DOB [birth year]) Completed  PAP SMEAR Completed  HPV SCREENING Completed    Immunizations:  Immunization  History   Administered Date(s) Administered    Influenza Virus Quadrivalent Vacc 3/> Yrs Im 05/31/2013, 03/27/2014    Td 06/21/1997, 10/30/2007       Histories:    Past Medical History    Allergic conjunctivitis 05/25/2012    Breast lump     Comment: 1/00 Bx > fibroadenoma    Helicobacter pylori (H. pylori) 2000    Comment: s/p treatment for 2 months    Hyperopia with astigmatism and presbyopia 05/25/2012    Other and unspecified ovarian cyst 1999    Pinguecula of both eyes 05/25/2012       Past Surgical History    CRANIEC TREPHINE BONE FLP BRAIN TUMOR SUPRTENTOR  1980    Comment craniotomy age 68, tumor pituitary    BRST RCNSTJ IMMT/DLYD W/TISS EXPANDER [place]    Comment s/p removal of excess skin    HYSTERECTOMY  1997    Comment surgery for uterine fibroids. 7/01 Pap of  vag cuff Negative; ovaries remain       Social History   Marital status: Married  Spouse name: N/A    Years of education: N/A  Number of children: 0     Occupational History   [employer]      Social History Main Topics   Smoking status: Never Smoker    Smokeless tobacco: Not  on file    Alcohol use No    Drug use: No    Sexual activity: Yes    Partners: Male     Other Topics Concern    Stress Concern No    Exercise Yes     Social History Narrative    Works with [employer], lives with husband, feels safe    From [place]    Arrived [place] 1985       Family History    Osteoporosis Mother     Psychiatric Illness Brother     Comment: depression    Cancer - Breast FamHxNeg     Cancer - Colon FamHxNeg     Diabetes FamHxNeg     Heart FamHxNeg        Review of Systems:                   Skin: hair thinning  Eyes: negative  Ears/Nose/Throat: nasal burning  Respiratory: negative  Cardiovascular: negative  Gastrointestinal: epigastric pain  Genitourinary: negative  [id number] menses, no abnormal bleeding, pelvic pain or discharge  no breast pain or new or enlarging lumps on self exam  Musculoskeletal: knee pain  Neurologic: negative  Endocrine: negative  Psychiatric: negative  Hematologic/Lymphatic/Immunologic: negative    Physical:  BP 135/85  Pulse 68  Temp 97.6 F (36.4 C) (Temporal)  Wt 69.9 kg (154 lb)  SpO2 100%  BMI 27.28 kg/m2  General appearance: healthy, alert, well developed, well nourished  Eyes: conjunctivae clear. PERRL, EOM's intact.   Skin: skin color, texture, turgor are normal  Very thick hair with no obvious hair loss  Head: Normocephalic. No masses, lesions, tenderness or abnormalities  Ears: External ears normal. Canals clear. TM's normal.  Nose/Sinuses: Nares normal. Septum midline. Mucosa normal. No drainage or sinus tenderness.  Oropharynx: Lips, mucosa, and tongue normal. Teeth and gums normal. Oropharynx moist and without lesion  Neck: Neck supple. No adenopathy. Thyroid symmetric, normal size, and without nodularity  Back: Back symmetric, no curvature.  No CVA tenderness.  Lungs:  Good diaphragmatic excursion. Lungs clear to auscultation bilaterally  Heart: PMI normal. No  lifts, heaves, or thrills. RRR. No murmurs, clicks, gallops or rubs  Breast: breasts symmetric, no  dominant or suspicious mass, no skin or nipple changes, no axillary adenopathy and self exam in taught and encouraged  Abdomen: Abdomen soft, +mild epigastric tenderness; no guarding, rebound;. BS normal. No masses, no organomegaly  Extremities: Extremities normal. No deformities, edema, or skin discoloration  Musculoskeletal: Muscular strength intact.  Knees are non tender with no effusions, normal ROM  Tender along anterior lower legs  Peripheral pulses: radial=4/4,  dorsalis pedis=4/4  Neuro: Gait normal.  Sensation grossly normal         Health Counseling:  Smoking:  Reviewed and Discussed  Substance Use Issues:   Reviewed and Discussed   Diet, Exercise, Wt. Control:  Reviewed and Discussed  Dental Health:  Reviewed and Discussed  Vision Health:  Reviewed and Discussed  Mental Health:  Reviewed and Discussed  Domestic Violence:  Reviewed and Discussed  Sexual Health:  Reviewed and Discussed  Osteoperosis: Reviewed and Discussed  Health Care Proxy:  Reviewed and Discussed      ASSESSMENT/PLAN:  (Z00.00) Routine general medical examination at a health care facility  (primary encounter diagnosis)  Comment: PE done  Pap not indicated  mammo up to date  Flu shot today  Check lipids, A1c  Plan: CHOLESTEROL, HIGH DENSITY LIPOPROTEIN, LOW         DENSITY LIPOPROTEIN,DIRECT            (R09.81) Sinus congestion  Comment: try adding back atrovent nasal spray  Continue flunisolide    Plan: ipratropium (ATROVENT) 0.06 % nasal spray            (R10.13) Epigastric pain  Comment: check h pylori stool  After this test resume zantac    Plan: ranitidine (ZANTAC) [id number] MG tablet, HELICOBACTER        PYLORI STOOL AG (INHOUSE)            (E05.90) Hyperthyroidism  Comment:   Plan: TSH (THYROID STIMULATING HORMONE)           (E55.9) Vitamin D deficiency  Comment: check vitamin D  Continue supplements    (M79.604,  M79.605) Leg pain, bilateral  Comment: cause of this anterior lower leg pain is not clear  While she has hx of OA in knees  she has no pain on knee exam  Check vitamin D  Plan: VITAMIN D,25 HYDROXY            (Z23) Need for prophylactic vaccination and inoculation against influenza  Comment:   Plan: IMMUNIZATION ADMIN SINGLE, RN, PR INFLUENZA VAC        QUAD PRESV FREE 3/> YRS IM (PRIVATE)           (Z13.1) DM (diabetes mellitus screen)  Comment:   Plan: HEMOGLOBIN A1C        (L65.9) Hair loss  Comment: tsh, cbc  Plan: CBC WITH PLATELET, FERRITIN           I have reviewed the past medical, surgical, social and family history and updated these sections of EpicCare as relevant. All interim labs, test results, and consult notes were reviewed and discussed with [name]. Medications were reconciled during this visit and a current medication list was given to the patient at the end of the visit.  We discussed the patients current medications. The patient expressed understanding and no barriers to adherence were identified.

## 2015-05-07 NOTE — Patient Instructions (Signed)
[facility]  [facility]  [address], [address]  [place] [address]  Clinical Nutrition Services    Calcium and Your Health    Calcium is a mineral that helps build strong bones and teeth, helps prevent brittle bones (osteoporosis) and hip fractures, and helps maintain a normal blood pressure. It is very important to get enough calcium each day.    * CALCIUM NEEDS AT ALL AGES: (RDI 2000)  Infants birth to 12 months  is 210 -270 mg/day          Child 1-8 years  is 500-.800 mg/day                     Adolescent 9-18years is 1300 mg/day                   Adults 19-30 years is 1000 mg/day  Adults 31-50 years is 1000 mg/day  Adults 51 and older is 1200 mg/day    * TO MAINTAIN GOOD BONE HEALTH, EAT A CALCIUM RICH DIET:    FOODS                                      Yogurt, plain, nonfat: 1 cup serving is 450 mgs of calcium    Ricotta cheese, part skim: 1/2 cup serving is 340 mgs of calcium    Milk, skim: 1 cup serving is 300 mgs of calcium    Milk, whole: 1 cup serving is 290 mgs of calcium    Sardines, canned, with bones: 3 oz serving is 280 mgs of calcium    Yogurt, plain, whole milk:1 cup serving is 275 mgs of calcium    Orange juice, calcium fortified:1 cup serving is 270 mgs of calcium    Swiss cheese: 1 oz serving is 270 mgs of calcium    Spinach, cooked: 1 cup serving is 240 mgs of calcium   Turnip greens, rhubarb, cooked:1 cup serving is 200 mgs of calcium   Salmon, canned, with bones: 3 oz serving is 180 mgs of calcium   Ice Cream: 1 cup serving is 175 mgs of calcium   Pudding, instant mix: 1/2 cup serving is 150 mgs of calcium   Almonds:  /2 cup serving is 150 mgs of calcium   Kale, cooked: 1 cup serving is 100 mgs of calcium   Lobster, cooked: 6 oz serving is 100 mgs of calcium   Tofu, with calcium sulfate:  3oz serving is 100 mgs of calcium     EXERCISE: Weight bearing exercises, such as walking, jogging, racquet sports, aerobics, and weight training  help make bones stronger.      VITAMIN D: Vitamin D is essential to help your body absorb calcium. A healthy body can make its own vitamin D with the help of sunlight. But in the winter months, you may not get enough sun, and should make   sure you get it from another source, like milk or a multivitamin.     HORMONES: The hormone estrogen helps the female body and bones to use calcium. After menopause women need to discuss hormone treatment with their doctor.    * This is just a first step in helping improve your health.  * For help with a meal plan for you, make an appointment with the Nutritionist.  * For more information, you can also contact the National Nutrition Hotline   1-800-366-1655                                                              9/95 MAS 1/96, 12/97eqj, 01/02, 4/02

## 2015-05-08 ENCOUNTER — Ambulatory Visit (HOSPITAL_BASED_OUTPATIENT_CLINIC_OR_DEPARTMENT_OTHER): Payer: Commercial Managed Care - HMO

## 2015-05-08 ENCOUNTER — Encounter (HOSPITAL_BASED_OUTPATIENT_CLINIC_OR_DEPARTMENT_OTHER): Payer: Self-pay | Admitting: Internal Medicine

## 2015-05-08 DIAGNOSIS — R1013 Epigastric pain: Secondary | ICD-10-CM

## 2015-05-08 LAB — HEMOGLOBIN A1C
ESTIMATED AVERAGE GLUCOSE: 105 (ref 74–160)
HEMOGLOBIN A1C: 5.3 % (ref 4.0–5.6)

## 2015-05-08 NOTE — Progress Notes (Signed)
Will send normal portuguese lab letter

## 2015-05-09 ENCOUNTER — Encounter (HOSPITAL_BASED_OUTPATIENT_CLINIC_OR_DEPARTMENT_OTHER): Payer: Self-pay | Admitting: Internal Medicine

## 2015-05-09 LAB — HELICOBACTER PYLORI STOOL AG

## 2015-05-09 NOTE — Progress Notes (Signed)
Will send normal lab letter in portuguese

## 2015-05-13 ENCOUNTER — Ambulatory Visit (HOSPITAL_BASED_OUTPATIENT_CLINIC_OR_DEPARTMENT_OTHER): Payer: Commercial Managed Care - HMO | Admitting: Endocrinology

## 2015-05-13 VITALS — BP 122/84 | HR 88 | Temp 98.1°F | Wt 152.0 lb

## 2015-05-13 DIAGNOSIS — E0590 Thyrotoxicosis, unspecified without thyrotoxic crisis or storm: Secondary | ICD-10-CM

## 2015-05-13 NOTE — Progress Notes (Signed)
This is a 59 yo woman who returns for annual f/u of a thyroid disorder:  HPI:  9/13 scalp hair loss and tired legs: TSH sent and found to be 0.17  10/13 ultrasound thyroid heterogeneous hypervascular; no nodules; right and left lobes both 4 cm; thyroid scan non-focal  Methimazole 11/3-2/14 without improvement in symptoms   2/14 TSI 87; TPO 13; 50 g irregular goiter on exam    I have reviewed the patient's medical, social, family history and meds in detail and updated the computerized patient record.    Due to the language barrier, the office visit was conducted in [place] with an interpreter. The interpreter was on the phone during the entire visit.       Review of Systems   Constitutional: Negative.    HENT: Negative.    Eyes: Negative.    Respiratory: Positive for shortness of breath.    Cardiovascular: Negative.  Negative for chest pain and palpitations.   Gastrointestinal: Negative.    Musculoskeletal: Positive for joint pain and myalgias.        Bilateral leg pains from knees down; chronic, long term. Wears sturdy shoes at work;  pain is worst when lying down at night- just discussed with PCP   Neurological: Negative.  Negative for dizziness, tingling and sensory change.   Psychiatric/Behavioral: Negative.    PHYSICAL EXAMINATION.  BP 122/84  Pulse 88  Temp 98.1 F (36.7 C)  Wt 68.9 kg (152 lb)  SpO2 98%  BMI 26.93 kg/m2  Pain Score: 0 (0/10)  Body mass index is 26.93 kg/(m^2).  No acute distress. Well developed, well-nourished.   Eyes: Full extraocular movements. No proptosis, lid lag, or stare.   Neck: 40 g firm slightly irregular goiter    No cervical lymphadenopathy appreciated.   Cardiovascular: Regular rate and rhythm, normal S1 and S2. No murmur/gallop/rub heard.  Respiratory: Clear to auscultation and percussion. Negative for respiratory distress/rales.   Musculoskeletal: Without clubbing, cyanosis or edema.   Skin: Cool and dry to touch without obvious lesions.   Neurological: Intact with no  tremor of the outstretched hands.  Psychiatric: Normal affect and responsiveness to questions.           THYROID FLOWSHEET Latest Ref Rng 05/07/2015 05/07/2014   Thyroid Screen TSH 0.358 - 3.740 uIU/mL  2.910   Thyroid Stim Hormone 0.358 - 3.740 uIU/mL 1.660    Free Thyroxine 0.55 - 1.12 ng/dl     Anti Microsomal Antibody (TPO) 0 - 34 IU/mL     TSI 0 - 139 %       THYROID FLOWSHEET Latest Ref Rng 05/15/2013   Thyroid Screen TSH 0.358 - 3.740 uIU/mL 2.660   Thyroid Stim Hormone 0.358 - 3.740 uIU/mL    Free Thyroxine 0.55 - 1.12 ng/dl    Anti Microsomal Antibody (TPO) 0 - 34 IU/mL    TSI 0 - 139 %      VITAMIN D,25 HYDROXY   Date Value   05/07/2015 33 ng/mL   02/16/2013 35 ng/mL   08/20/2009 25.9 ng/ml (L)   ----------    ASSESSMENT:  1. Subclinical hyperthyroidism, resolved  2. Thyroid goiter, slightly smaller on exam today than in past. Unclear etiology, as antibodies have been negative, though anti-TG never done  3. Scalp hair thinning and leg pains, long term, without evidence that these are/were related to thyroid dysfunction, as it resolved but these sx have persisted.     RECOMMEND:   1. Annual TSH and thyroid physical exam  are appropriate, given goiter, so f/u 1 year   2. Ordered anti-Tg to be done with any next labs  3. WIll pass on info to PCP that leg pains still a problem

## 2015-05-13 NOTE — Progress Notes (Signed)
Patient feels safe at home.

## 2015-05-30 ENCOUNTER — Telehealth (HOSPITAL_BASED_OUTPATIENT_CLINIC_OR_DEPARTMENT_OTHER): Payer: Self-pay | Admitting: Internal Medicine

## 2015-05-30 NOTE — Telephone Encounter (Signed)
-----   Message from [place] sent at 05/29/2015  9:11 PM EST -----  Regarding: leg pain fu  Hi  Pt seen by me for CPE and she complained of bilateral leg pain  I check vitamin D that was normal  She then saw endocrine and Dr Hurshel Keys tells me she spent the entire visit complaining about her leg pain  Can you outreach and offer her fu apt with me or stacy to discuss further (if she wants)  Thanks  Horris Latino

## 2015-05-30 NOTE — Progress Notes (Signed)
Due to the language barrier, the phone call was conducted in [place] with an interpreter. The interpreter's name is  Architectural technologist. The interpreter was on the phone during the entire phone call.    Called and spoke with pt she continues with the leg pain.  Reviewed providers guidance.  She agrees to appt next week.  Needs appt after 1700.  Scheduled to see provider at 1730 on 06/03/15.

## 2015-06-03 ENCOUNTER — Ambulatory Visit (HOSPITAL_BASED_OUTPATIENT_CLINIC_OR_DEPARTMENT_OTHER): Payer: Commercial Managed Care - HMO | Admitting: Physician Assistant

## 2015-06-03 ENCOUNTER — Encounter (HOSPITAL_BASED_OUTPATIENT_CLINIC_OR_DEPARTMENT_OTHER): Payer: Self-pay | Admitting: Physician Assistant

## 2015-06-03 VITALS — BP 134/77 | HR 71 | Temp 97.8°F | Wt 156.2 lb

## 2015-06-03 DIAGNOSIS — M79604 Pain in right leg: Secondary | ICD-10-CM

## 2015-06-03 DIAGNOSIS — M79605 Pain in left leg: Secondary | ICD-10-CM

## 2015-06-03 NOTE — Progress Notes (Signed)
Kayla Garza is a 59 year old female patient of Huntley Estelle who presents to the clinic today with leg pain.    SUBJECTIVE:    The patient presents today with the following concerns:  1. 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[place])     Osteoarthritis of both knees     Coccyx pain     Laryngopharyngeal reflux (LPR)     Tonsillith      Current Outpatient Prescriptions on File Prior to Visit:  ipratropium (ATROVENT) 0.06 % nasal spray 2 sprays by Each Nostril route 4 (four) times daily Disp: 15 mL Rfl: 5   ranitidine (ZANTAC) 150 MG tablet Take 1 tablet by mouth 2 (two) times daily Disp: 60 tablet Rfl: 3   Flunisolide 25 MCG/ACT (0.025%) SOLN nasal spray 2 sprays by Each Nostril route 2 (two) times daily Disp: 1 Bottle Rfl: 6   Cholecalciferol (VITAMIN D PO) Take by mouth Disp:  Rfl:      No current facility-administered medications on file prior to visit.   All medications reviewed with patient.  Review of Patient's Allergies indicates:  No Known Allergies  Smoking status: Never Smoker                                                              Alcohol use: No              Medical/Surgical/Family History reviewed with the patient.  Relevant changes have been made in 'history' section.    Recent laboratories and imaging reviewed prior to visit.    Review of Systems/HPI:    Leg pain:  -only at night when laying down  -no pain right now  -pain in both sides evenly  -worse when in bed at night  -starts below knees, to feet, more in calves  -recently has also had in thighs  -pain described as not: burning, not sharp; hard to describe  -sort of like  squeezing  -pain lasts, can go all night  -a long time has been going on, probably years, goes away for a time, but then comes back  -wears sneakers at work, standing most of the day, works Control and instrumentation engineer at [employer]  -has not tried any medicine, doesn't like to take    All other systems reviewed and negative.    OBJECTIVE:    Vital Signs:  BP 134/77  Pulse 71  Temp 97.8 F (36.6 C) (Temporal)  Wt 70.9 kg (156 lb 3.2 oz)  SpO2 99%  BMI 27.67 kg/m2  Pain Score: Data Unavailable    Physical Exam:  General: Well developed, well nourished female sitting comfortably  HEENT: Head normocephalic and atraumatic  Cardiovascular: RRR with normal S1 and S2, no m/r/g appreciated  Respiratory: lungs clear to auscultation  bilaterally without wheeze or rhonci, normal work of breathing  Musculoskeletal: no bony tenderness or redness or swelling in bilateral ankles/knees, no repetition of pain with palpation in calf and anterior lower leg, minimal varicosities present R > L  Extremities: Warm and well perfused  Skin: Warm and dry without concerning lesions  Psychiatric:  Mood and behavior normal with appropriate affect    ASSESSMENT AND PLAN:  Additional plans reviewed with patient and listed below under patient instructions and provided in AVS.    (M79.604,  M79.605) Bilateral lower extremity pain  Comment: patient with bilateral lower extremity pain in the evenings.  Does not appear to be cramping, but rather pain in entire lower leg that does not repent once it starts.  Recent Tsh/a1c/cbc/vit d normal 04/2015.  Pain not described as burning, so unlikely to be neuropathy.  Discussed options including physiatry referral for further eval or trial of nightly med like TCA to see if this helps with pain.  Patient not enthusiastic about medication.    Plan: patient prefers that I speak further with PCP and come up with recommendation regarding further workup or management of pain.    Reasons to call or return to clinic were  discussed.    HEALTH MAINTENANCE:  TDAP/TD VACCINE(1 - Tdap) due on 10/31/2007    I explained the diagnosis and treatment plan, and the patient/parent/guardian expressed understanding of the content. We discussed all medicines prescribed and the importance of medication adherence. The patient/parent/guardian expressed understanding and no barriers to adherence were identified.  Possible side effects of the prescribed medication(s) were explained.  I attempted to answer all questions regarding the diagnosis and the proposed treatment.    We discussed the patients current medications including proper use and potential side effects. The patient expressed understanding and no barriers to adherence were identified.     1. The patient indicates understanding of these issues and agrees with the plan. Brief care plan is updated and reviewed with the patient.   2. The patient is given an After Visit Summary sheet that lists all medications with directions, allergies, orders placed during this encounter, and follow-up instructions.   3. I reviewed the patient's medical information and medical history   4. I reconciled the patient's medication list and prepared and supplied needed refills.   5. I have reviewed the past medical, family, and social history sections including the medications and allergies.    Evelynne Spiers B. [place], [place]

## 2015-06-05 ENCOUNTER — Telehealth (HOSPITAL_BASED_OUTPATIENT_CLINIC_OR_DEPARTMENT_OTHER): Payer: Self-pay | Admitting: Internal Medicine

## 2015-06-05 DIAGNOSIS — M79605 Pain in left leg: Secondary | ICD-10-CM

## 2015-06-05 DIAGNOSIS — M79604 Pain in right leg: Secondary | ICD-10-CM

## 2015-06-05 NOTE — Progress Notes (Signed)
Due to the language barrier, the phone call was conducted in [place] with an interpreter. The interpreter's name is kelly. The interpreter was on the phone during the entire phone call.      Called and spoke with pt.  Reviewed providers guidance.  She agrees to referral.  Wants appt in the afternoon.    Forwarded to provider.

## 2015-06-05 NOTE — Progress Notes (Signed)
Referral placed.

## 2015-06-05 NOTE — Telephone Encounter (Signed)
-----   Message from [place]. Ashby Dawes, [place] sent at 06/05/2015  3:03 PM EST -----  Regarding: f/u leg pain  Saw patient this week.  She is having longstanding leg pain.  Really unclear etiology.  She is hesitant to try medications.  Discussed with bonnie and we think the next step should be referral to physiatry.  Can you let the patient know?  I'll place the referral as soon as she's informed.    Thanks,  Marzetta Board

## 2015-07-11 ENCOUNTER — Ambulatory Visit (HOSPITAL_BASED_OUTPATIENT_CLINIC_OR_DEPARTMENT_OTHER): Payer: Commercial Managed Care - HMO

## 2015-07-11 ENCOUNTER — Encounter (HOSPITAL_BASED_OUTPATIENT_CLINIC_OR_DEPARTMENT_OTHER): Payer: Self-pay

## 2015-07-11 ENCOUNTER — Telehealth (HOSPITAL_BASED_OUTPATIENT_CLINIC_OR_DEPARTMENT_OTHER): Payer: Self-pay | Admitting: Internal Medicine

## 2015-07-11 VITALS — BP 133/86 | HR 96 | Temp 98.9°F | Wt 156.2 lb

## 2015-07-11 DIAGNOSIS — R05 Cough: Secondary | ICD-10-CM

## 2015-07-11 DIAGNOSIS — R059 Cough, unspecified: Secondary | ICD-10-CM

## 2015-07-11 MED ORDER — CETIRIZINE HCL 10 MG PO TABS
10.00 mg | ORAL_TABLET | Freq: Every day | ORAL | 0 refills | Status: AC
Start: 2015-07-11 — End: 2015-08-10

## 2015-07-11 MED ORDER — GUAIFENESIN-CODEINE 100-10 MG/5ML PO SYRP
10.0000 mL | ORAL_SOLUTION | Freq: Every evening | ORAL | 0 refills | Status: AC | PRN
Start: 2015-07-11 — End: 2015-07-21

## 2015-07-11 NOTE — Progress Notes (Signed)
[place] interpreter used             Kayla Garza is a 60 year old female who p/w Patient presents with:  Cough: started this wed  Pt has had cough for 4 days, has been getting Sometimes SOB w/ coughing.  Cough productive of white phlegm.  +runny nose, +wet cough, +throat feels burning.  Has tried to self treat w/ otc guaifenesin/dm.  Used ipratropium nasal spray on Mon.  +subj fever on weds.    ROS as above.  PMH/MEds/allergies reviewed per Epic.    BP 133/86  Pulse 96  Temp 98.9 F (37.2 C) (Temporal)  Wt 70.9 kg (156 lb 3.2 oz)  SpO2 97%  BMI 27.67 kg/m2  GEN: alert, sick-looking but NAD  HEENT: NCAT, anicteric, no injection, TMs nl, OP mmm w/ cobblestoning/posteroir nasal drip, no cervical LAN  CV: RRR, -M/R/G  PULM: nl effort, some coughing, CTAB, no w/r/r    A/P  60 y/o F w/ cough likely postviral  1. Cough  No red flags, no need for abx.  Meds for symptomatic tx as below.  If no improvement would have pt start flonase and albuterol.  Advised taking ipratropium nasal spray more consistenly  - cetirizine (ZYRTEC) 10 MG tablet; Take 1 tablet by mouth daily  Dispense: 30 tablet; Refill: 0  - guaifenesin-codeine (ROBITUSSIN AC) 100-10 MG/5ML syrup; Take 10 mLs by mouth nightly as needed for Cough  Dispense: 118 mL; Refill: 0    Pt agrees to above.    Orders Placed This Encounter      cetirizine (ZYRTEC) 10 MG tablet          Sig: Take 1 tablet by mouth daily          Dispense:  30 tablet          Refill:  0      guaifenesin-codeine (ROBITUSSIN AC) 100-10 MG/5ML syrup          Sig: Take 10 mLs by mouth nightly as needed for Cough          Dispense:  118 mL          Refill:  0

## 2015-07-11 NOTE — Progress Notes (Signed)
See note in clinic visit.

## 2015-07-11 NOTE — Progress Notes (Signed)
To clinic with cough since Monday.  Non-productive.  Today started with some CP while coughing.  Was up most of the night.  Denies known fevers.  Feels very fatigued.  No openings and paged provider to see if he will be able to add pt to schedule

## 2015-07-11 NOTE — Telephone Encounter (Signed)
-----   Message from Alease Medina sent at 07/11/2015  3:08 PM EST -----  Regarding: walk in/cough  Pt came in and complaining of bad cough. pls advise

## 2015-07-16 ENCOUNTER — Encounter (HOSPITAL_BASED_OUTPATIENT_CLINIC_OR_DEPARTMENT_OTHER): Payer: Self-pay | Admitting: Otolaryngology

## 2015-07-16 ENCOUNTER — Ambulatory Visit (HOSPITAL_BASED_OUTPATIENT_CLINIC_OR_DEPARTMENT_OTHER): Payer: Commercial Managed Care - HMO | Admitting: Otolaryngology

## 2015-07-16 DIAGNOSIS — K219 Gastro-esophageal reflux disease without esophagitis: Secondary | ICD-10-CM

## 2015-07-16 DIAGNOSIS — J358 Other chronic diseases of tonsils and adenoids: Secondary | ICD-10-CM

## 2015-07-16 DIAGNOSIS — J329 Chronic sinusitis, unspecified: Secondary | ICD-10-CM

## 2015-07-16 NOTE — Progress Notes (Signed)
HPI: 60 year old female Kayla Garza is a followup for LPR, tonsilliths, and CS    [place] Interpreter    Patient was last seen 3 months ago.  She is on flunisolide, diet and lifestyle changes, and rinses for tonsilliths.     She is doing well on all fronts.  She denies odynophagia, dysphagia, dysphonia, globus sensation, heartburn, or throat clearing. She denies nasal congestion, rhinorrhea, PND, hyposmia, or facial pressure or pain.   She is only using flunisolide as needed.     Past medical, social and family history reviewed in EPIC.    Review of Systems -   Fever No  Cough No  Easy Bruising No    EXAM  General: WD WN female with a normal voice.  Face: No lesions  Facial Strength: Normal and symmetric.  Eyes: PERRLA and EOMI.  Ear R: No lesions. Cerumen: none / minimal. Canal clear. TM clear.  Ear L:  No lesions. Cerumen: none / minimal. Canal clear. TM clear.  Nose:  Septum midline.  Turbinates normal size.  No polyps or masses.  Nasopharynx:  CNV.    Oral Cavity/Oropharynx:  Mucous membranes moist.  Tonsils 0+.  Teeth in moderate condition, upper denture.  Tongue no lesions.  Hypopharynx: Base of tongue no masses. Pyriform sinuses clear.  Larynx: Epiglottis no edema or lesions. Vocal cords: mobile with no lesions or polyps. Mild PC edema  Neck:  Trachea midline.  No palpable masses.  Thyroid: Normal to palpation.  Lymph:  No palpable nodes.  Subman glands:  Normal size, non-tender  Parotids: Normal size, no masses or nodes.      A/P  60 year old female presents for reassessment of LPR, tonsilliths, and chronic sinusitis.  Her LPR is controlled with diet and lifestyle changes.  Her tonsilliths are not bothering her anymore. Her nasal issues are improved with nasal steroid spray.      Doing well    Continue flunisolide    Elected return as needed

## 2015-09-03 ENCOUNTER — Ambulatory Visit (HOSPITAL_BASED_OUTPATIENT_CLINIC_OR_DEPARTMENT_OTHER): Payer: Commercial Managed Care - HMO | Admitting: Physical Medicine & Rehabilitation

## 2015-09-03 VITALS — BP 128/82 | HR 72 | Temp 98.4°F | Resp 16 | Ht 61.42 in | Wt 153.0 lb

## 2015-09-03 DIAGNOSIS — R252 Cramp and spasm: Secondary | ICD-10-CM

## 2015-09-03 DIAGNOSIS — E559 Vitamin D deficiency, unspecified: Secondary | ICD-10-CM

## 2015-09-03 MED ORDER — IBUPROFEN 800 MG PO TABS
800.00 mg | ORAL_TABLET | Freq: Three times a day (TID) | ORAL | 0 refills | Status: AC | PRN
Start: 2015-09-03 — End: 2015-12-04

## 2015-09-03 NOTE — Progress Notes (Signed)
.  This office note has been dictated. Account number [id number]

## 2015-09-04 LAB — XR LUMBAR SPINE WITH OBLIQUES, FLEXION & EXTENSION

## 2015-09-04 NOTE — Progress Notes (Signed)
Date of Service: 09/03/2015    HISTORY OF PRESENT ILLNESS:  The patient is a pleasant 60 year old woman whom I am seeing at the request of Dr. Fleet Contras for bilateral leg pain.  She described a more than 3-year history of pain  between the knee and the ankle, mostly only at night.  She describes significant cramping involving her legs.  She denies any weakness, paresthesia.  She denies pain or proximal to the knee.  The patient denies any back pain.  The pain is not exacerbated with activity, but it is only noticeable at night and when she is resting.  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  She reports that this pain seems to be similar when she was on vacation in June of last year.    She has not tried any medication consistently for symptomatic relief of pain though she seems to notice some improvement of her pain with the use of ibuprofen.  The patient is more concerned about finding the reasoning for her leg pain.  She stopped exercising at the gym about 2 years ago thinking that this was related to her pain, but did not notice any difference, yet the patient did not return to the gym, either.    REVIEW OF SYSTEMS:  She otherwise denies fever, night sweats, chills, recurrent cough or pneumonia, or significant weight change, bowel or bladder incontinence, or skin changes.  She does complain of a sinus infection.  Please refer to the spine questionnaire for detailed review of systems.    PAST MEDICAL AND SURGICAL HISTORY:  Allergic conjunctivitis, hyperopia, overweight, vision loss left eye momentary, vitamin D deficiency, menopausal syndrome, ovarian cysts, allergic rhinitis, renal anomaly, hypothyroidism, pituitary-dependent Cushing's disease, osteoarthritis both knees, coccyx pain, laryngopharyngeal reflux, tonsillectomy, plastic surgery in 1988.    FAMILY HISTORY:  Noncontributory.    SOCIAL HISTORY:  She works in a Pharmacist, community.  She is married.  She does not have any  children.  She denies tobacco, alcohol, or illicit drug use.    PHYSICAL EXAMINATION  GENERAL:  This is a 5 feet 1 inch, 69.4 kilogram 60 year old woman who is alert, oriented to time, person, and place with appropriate mood and affect.  VITAL SIGNS:  Blood pressure 128/82, heart rate of 72, respiratory rate of 16, temperature 98.4 degrees Fahrenheit.  Patient has normal gait.  There is normal strength in upper and lower limb without tone abnormality or atrophy.  grossly  SENSORY: Unremarkable to light tough.  SPINE RANGE OF MOTION:  Normal curvature of the thoracic and lumbar spine with negative straight leg raising bilaterally.  No pain reproduction with flexion and extension of the lumbar spine.  PERIPHERAL JOINT RANGE OF MOTION:  There is full active range of motion throughout without obvious instability or laxity.  PALPATION:  Revealed no tenderness along the calf muscle.  PERIPHERAL VASCULAR:  No edema, erythema, or increased warmth appreciated.  APPEARANCE OF SKIN:  Grossly unremarkable.    IMPRESSION REPORT PLAN:  This is a pleasant 60 year old woman who presents with bilateral calf cramping, mostly at night.  I am not certain of the etiology of this pain.  Certainly, this may be due to muscle fatigue as she stands for a fairly long period of time.  She was found to have a mild deficiency of vitamin D, which is currently being corrected.  There was no objective evidence of nerve impingement at the lumbar spine on  examination today.  Nonetheless, I ordered an x-ray of the lumbar spine for further evaluation.  This was essentially unremarkable.  If pain persists, a referral to vascular medicine would be considered.  In the meantime, however, I asked that she participate in physical therapy to review various stretching exercise for the lower limb.  She may also benefit from yoga exercises.  I also advised that she use ibuprofen at bedtime as needed for symptomatic relief of pain.    I answered all of her  questions.  She expressed full understanding of discussion, agreed to proceed as above.  ___________________________  Reviewed and Electronically Signed By: Deri Fuelling MD  Sig Date: 09/10/2015  Sig Time: 18:24:07  Dictated By: Deri Fuelling MD  Dict Date: 09/03/2015 Dict Time: 04 53 PM    Dictation Date and Time:09/03/2015 16:53:10  Transcription Date and Time:09/03/2015 17:23:58  eScription Dictation id: [id number] Confirmation # [id number]      cc: Gordan Payment MD; Neita Garnet RN

## 2015-10-20 ENCOUNTER — Ambulatory Visit (HOSPITAL_BASED_OUTPATIENT_CLINIC_OR_DEPARTMENT_OTHER): Payer: Commercial Managed Care - HMO | Admitting: Rehabilitative and Restorative Service Providers"

## 2015-12-03 ENCOUNTER — Ambulatory Visit (HOSPITAL_BASED_OUTPATIENT_CLINIC_OR_DEPARTMENT_OTHER): Payer: Commercial Managed Care - HMO | Admitting: Physical Medicine & Rehabilitation

## 2015-12-31 ENCOUNTER — Other Ambulatory Visit (HOSPITAL_BASED_OUTPATIENT_CLINIC_OR_DEPARTMENT_OTHER): Payer: Self-pay | Admitting: Internal Medicine

## 2015-12-31 DIAGNOSIS — Z1239 Encounter for other screening for malignant neoplasm of breast: Secondary | ICD-10-CM

## 2016-01-07 ENCOUNTER — Ambulatory Visit (HOSPITAL_BASED_OUTPATIENT_CLINIC_OR_DEPARTMENT_OTHER): Payer: Self-pay | Admitting: Internal Medicine

## 2016-01-07 DIAGNOSIS — Z1239 Encounter for other screening for malignant neoplasm of breast: Secondary | ICD-10-CM

## 2016-01-07 NOTE — Addendum Note (Signed)
Addended by: Jilda Roche on: 01/07/2016 03:11 PM     Modules accepted: Orders

## 2016-01-08 LAB — MA SCREENING MAMMO BILATERAL DIGITAL WITH DBT & CAD

## 2016-02-05 ENCOUNTER — Telehealth (HOSPITAL_BASED_OUTPATIENT_CLINIC_OR_DEPARTMENT_OTHER): Payer: Self-pay | Admitting: Registered Nurse

## 2016-02-05 NOTE — Progress Notes (Signed)
Walk in for back pain    Since Monday  Tight in neck, left side tingling in 3rd 4th & 5th fingers when she wakes up in the morning.    No tingling during day  Full ROM/motor controlFull sensation      sched'd for tomorrow appt  Reviewed home care heat, ibuprofen

## 2016-02-05 NOTE — Telephone Encounter (Signed)
-----   Message from Cloyde Reams sent at 02/05/2016  4:27 PM EDT -----  Regarding: walk in BAck pain   Stem    Good afternoon walk in  With Back pain     Kayla Garza [place] [id number], 60 year old, female, Telephone Information:  Home Phone      [phone]  Work Phone      [phone]  Mobile          [phone]      Patient's Preferred Pharmacy:     [facility] [place] [place]  Phone: [phone] Fax: [phone]      C    Available times:

## 2016-02-06 ENCOUNTER — Ambulatory Visit (HOSPITAL_BASED_OUTPATIENT_CLINIC_OR_DEPARTMENT_OTHER): Payer: Commercial Managed Care - HMO | Admitting: Internal Medicine

## 2016-02-06 ENCOUNTER — Encounter (HOSPITAL_BASED_OUTPATIENT_CLINIC_OR_DEPARTMENT_OTHER): Payer: Self-pay | Admitting: Internal Medicine

## 2016-02-06 VITALS — BP 131/94 | HR 77 | Temp 97.7°F | Wt 153.8 lb

## 2016-02-06 DIAGNOSIS — M545 Low back pain, unspecified: Secondary | ICD-10-CM

## 2016-02-06 DIAGNOSIS — E0590 Thyrotoxicosis, unspecified without thyrotoxic crisis or storm: Secondary | ICD-10-CM

## 2016-02-06 LAB — FREE THYROXINE: FREE THYROXINE: 1.05 ng/dL (ref 0.76–1.46)

## 2016-02-06 LAB — THYROID SCREEN TSH REFLEX FT4: THYROID SCREEN TSH REFLEX FT4: 3.92 u[IU]/mL — ABNORMAL HIGH (ref 0.358–3.740)

## 2016-02-06 NOTE — Progress Notes (Signed)
HPI:  Kayla Garza is a 59 year old patient of who comes in today for     1. Neck pain and feeling a little hot on Monday. Then got better. Last night felt like "she had a fever, but inside." Never measured. Then had pain all over the left side. No neurological symptoms. Her pain has been relieved with ibuprofen. No sick contacts.      PMH/PSH:   Past Medical History:  05/25/2012: Allergic conjunctivitis  No date: Breast lump      Comment: 1/00 Bx > fibroadenoma  [id number]: Helicobacter pylori (H. pylori)      Comment: s/p treatment for 2 months  05/25/2012: Hyperopia with astigmatism and presbyopia  1999: Other and unspecified ovarian cyst  05/25/2012: Pinguecula of both eyes  Past Surgical History:  1990: BRST RCNSTJ IMMT/DLYD W/TISS EXPANDER SBSQ XPN*      Comment: s/p removal of excess skin  1980: CRANIEC TREPHINE BONE FLP BRAIN TUMOR SUPRTENT*      Comment: craniotomy age 37, tumor pituitary  1997: HYSTERECTOMY      Comment: surgery for uterine fibroids. 7/01 Pap of  vag               cuff Negative; ovaries remain   Reviewed and updated today   ALLERGIES: has No Known Allergies.  MEDS:     Current Outpatient Prescriptions on File Prior to Visit:  Cholecalciferol (VITAMIN D PO) Take by mouth Disp:  Rfl:      No current facility-administered medications on file prior to visit.   Reviewed and updated today *  SH and FH: as noted in EPIC  Updated as necessary  ROS:    As per above      PE:    Vitals: BP (!) 131/94   Pulse 77   Temp 97.7 F (36.5 C) (Temporal)   Wt 69.8 kg (153 lb 12.8 oz)   SpO2 99%   BMI 28.67 kg/m2    General appearance: WNWD, NAD  HEENT : Anicteric sclerae  Lungs: CTAB  Cardiac: RRR, no m/r/g  Abdomen: NTND  Extremities: No peripheral edema  Back: No tenderness of spine, paraspinal muscles, negative SLR, normal reflexes  Skin: No concerning lesions noted on bedside exam  Neuro: Alert and oriented  Psych: Normal affect        ASSESSMENT AND PLAN  (M54.5) Acute left-sided low back pain without  sciatica  (primary encounter diagnosis)  Comment: There is a no clear diagnosis which can explain her migrating symptoms from neck, to lower back, to "fever inside" with normal temp. No history of trauma, accidents, or neurological deficits. I suspect virus that will resolve given that she is already improving, completely normal exam.   Plan: Asked patient to follow up with [place] in a week if symptoms persist    (E05.90) Hyperthyroidism  Comment: Requesting a repeat test, not on replacement, history of hyperthyroidism s/p methimazole treatment  Plan: THYROID SCREEN TSH REFLEX FT4, FREE THYROXINE         I explained the diagnosis and treatment plan, and the patient expressed understanding of the content. I attempted to answer any questions regarding the diagnosis and the proposed treatment.    The patient indicates understanding of these issues and agrees with the plan. Follow-up is planned as needed.    Ms. Mccalman has been advised to call or return with any worsening or new problems.    Andi Devon, MD

## 2016-02-09 ENCOUNTER — Other Ambulatory Visit (HOSPITAL_BASED_OUTPATIENT_CLINIC_OR_DEPARTMENT_OTHER): Payer: Self-pay | Admitting: Physician Assistant

## 2016-02-09 DIAGNOSIS — Z7189 Other specified counseling: Secondary | ICD-10-CM

## 2016-02-10 ENCOUNTER — Encounter (HOSPITAL_BASED_OUTPATIENT_CLINIC_OR_DEPARTMENT_OTHER): Payer: Self-pay | Admitting: Physician Assistant

## 2016-02-10 DIAGNOSIS — E038 Other specified hypothyroidism: Secondary | ICD-10-CM | POA: Insufficient documentation

## 2016-02-10 DIAGNOSIS — E039 Hypothyroidism, unspecified: Secondary | ICD-10-CM | POA: Insufficient documentation

## 2016-02-10 NOTE — Progress Notes (Signed)
Dear RN,    Please:    1. Share with the patient the attached results     Assessment:  One of your thyroid numbers was a little high. This is unlikely to cause any problems, but we should recheck it in 6 months    Plan:  In 6 months, we will recheck your thyroid levels.    2. Type of Outreach: 1 phone call and if unable to reach send letter    3. Document the conversation in the Telephone Encounter and route back to me.    Thank you,  Andi Devon, MD

## 2016-02-10 NOTE — Addendum Note (Signed)
Addended by: Andi Devon on: 02/10/2016 01:22 PM     Modules accepted: Orders

## 2016-02-19 ENCOUNTER — Telehealth (HOSPITAL_BASED_OUTPATIENT_CLINIC_OR_DEPARTMENT_OTHER): Payer: Self-pay | Admitting: Registered Nurse

## 2016-02-19 NOTE — Progress Notes (Signed)
Andi Devon, MD  [facility]; [employer]               Dear RN,     Please:    1. Share with the patient the attached results     Assessment:   One of your thyroid numbers was a little high. This is unlikely to cause any problems, but we should recheck it in 6 months     Plan:   In 6 months, we will recheck your thyroid levels.     2. Type of Outreach: 1 phone call and if unable to reach send letter     3. Document the conversation in the Telephone Encounter and route back to me.     Thank you,   Andi Devon, MD

## 2016-02-19 NOTE — Progress Notes (Signed)
Due to the language barrier, the phone call was conducted in [place] with an interpreter. The interpreter's name is [employer]. The interpreter was on the phone during the entire phone call.  Reached pt identified VM. Left msg requesting call back to [facility] PCP office for results message.

## 2016-02-20 NOTE — Progress Notes (Signed)
Reached pt via [place] interpreter services. Relayed results message below, pt verbalized understanding of results and plan.

## 2016-05-18 ENCOUNTER — Ambulatory Visit (HOSPITAL_BASED_OUTPATIENT_CLINIC_OR_DEPARTMENT_OTHER): Payer: Commercial Managed Care - HMO | Admitting: Endocrinology

## 2016-05-18 ENCOUNTER — Encounter (HOSPITAL_BASED_OUTPATIENT_CLINIC_OR_DEPARTMENT_OTHER): Payer: Self-pay | Admitting: Endocrinology

## 2016-05-18 VITALS — BP 118/64 | HR 98 | Temp 97.0°F | Wt 155.4 lb

## 2016-05-18 DIAGNOSIS — E038 Other specified hypothyroidism: Secondary | ICD-10-CM

## 2016-05-18 DIAGNOSIS — E0590 Thyrotoxicosis, unspecified without thyrotoxic crisis or storm: Secondary | ICD-10-CM

## 2016-05-18 DIAGNOSIS — E039 Hypothyroidism, unspecified: Secondary | ICD-10-CM

## 2016-05-18 LAB — THYROID SCREEN TSH REFLEX FT4: THYROID SCREEN TSH REFLEX FT4: 2.17 u[IU]/mL (ref 0.358–3.740)

## 2016-05-18 NOTE — Progress Notes (Signed)
This is a 60 yo woman who returns for annual f/u of a thyroid disorder:  HPI:  9/13 scalp hair loss and tired legs: TSH sent and found to be 0.17  10/13 ultrasound thyroid heterogeneous hypervascular; no nodules; right and left lobes both 4 cm; thyroid scan non-focal  Methimazole 11/3-2/14 without improvement in symptoms   2/14 TSI 87; TPO 13; 50 g irregular goiter on exam    I have reviewed the patient's medical, social, family history and meds in detail and updated the computerized patient record.    Due to the language barrier, the office visit was conducted in [place] with an interpreter. The interpreter was on the phone during the entire visit.       Review of Systems   Constitutional: Positive for malaise/fatigue.        Fatigue; not clear whether this is new- patient not able to clarify   Respiratory: Negative for cough and shortness of breath.    Cardiovascular: Positive for palpitations. Negative for chest pain.        Notes heart beats fast at times; at night; makes it difficult to get comfortable to sleep; started in July   Skin:        Scalp hair loss, unchanged- reported same problem 4 yrs ago; hair falls out a lot; notes scalp hair is thinning   All other systems reviewed and are negative.  PHYSICAL EXAMINATION.  BP 118/64 (Site: RA, Position: Sitting, Cuff Size: Reg)  Pulse 98  Temp 97 F (36.1 C) (Temporal)  Wt 70.5 kg (155 lb 6.4 oz)  SpO2 98%  BMI 28.97 kg/m2  Pain Score: 0 (0/10)  Body mass index is 28.97 kg/m.  No acute distress. Well developed, well-nourished.   Eyes: Full extraocular movements. No proptosis, lid lag, or stare.   Neck: 40 g firm slightly irregular goiter. No cervical lymphadenopathy appreciated.   Cardiovascular: Regular rate and rhythm, normal S1 and S2. No murmur/gallop/rub heard.  Respiratory: Clear to auscultation and percussion. Negative for respiratory distress/rales.   Musculoskeletal: Without clubbing, cyanosis or edema.   Skin: Cool and dry to touch without  obvious lesions.   Neurological: Intact with no tremor of the outstretched hands.  Psychiatric: Normal affect and responsiveness to questions.          THYROID FLOWSHEET Latest Ref Rng & Units 02/06/2016 05/07/2015   Thyroid Screen TSH 0.358 - 3.740 uIU/mL 3.920 (H)    Thyroid Stim Hormone 0.358 - 3.740 uIU/mL  1.660   Free Thyroxine 0.76 - 1.46 ng/dL 1.05    Anti Microsomal Antibody (TPO) 0 - 34 IU/mL     TSI 0 - 139 %       THYROID FLOWSHEET Latest Ref Rng & Units 02/06/2016 05/07/2015   Thyroid Screen TSH 0.358 - 3.740 uIU/mL 3.920 (H)      THYROID FLOWSHEET Latest Ref Rng & Units 05/07/2014 05/15/2013   Thyroid Screen TSH 0.358 - 3.740 uIU/mL 2.910 2.660     THYROID FLOWSHEET Latest Ref Rng & Units 11/14/2012 09/25/2012 08/17/2012   Thyroid Screen TSH 0.358 - 3.740 uIU/mL 2.640 3.380      THYROID FLOWSHEET Latest Ref Rng & Units 02/22/2012 02/16/2012   Thyroid Screen TSH 0.358 - 3.740 uIU/mL 0.17 (L) 0.24 (L)       ASSESSMENT:   1. History of subclinical hyPERthyroidism, but latest TSH shows subclinical hyPOthyroidism. This is consistent with autoimmune thyroid disease, as are the goiter and heterogeneous hypervascular ultrasound appearance, despite negative TPO.  2. Fatigue, scalp hair thinning, and palpitations; these are most likely unrelated to thyroid status, and in fact have been reported by patient on prior visits, despite opposite direction of TSH in past.     RECOMMEND:   1. TSH, anti-TG sent  2. If not hyperthyroid, I defer to PCP on whether it would be appropriate to further work up palpitations.   3. Reviewed with patient that her symptoms are not consistent with the latest TSH showing subclinical hypothyroidism.   4. F/u 1 yr unless thyroid derangement that is more than subclinical on today's labs.     Needs anti-TG, repeat TSH sent  WIll need Holter perhaps to address cardiac sx  Avoid use of "high" to explain TSH abn?

## 2016-05-19 LAB — THYROGLOBULIN ANTIBODY: THYROGLOBULIN ANTIBODY: <1 IU/mL (ref 0.0–0.9)

## 2016-05-24 ENCOUNTER — Encounter (HOSPITAL_BASED_OUTPATIENT_CLINIC_OR_DEPARTMENT_OTHER): Payer: Self-pay | Admitting: Endocrinology

## 2016-09-01 ENCOUNTER — Ambulatory Visit (HOSPITAL_BASED_OUTPATIENT_CLINIC_OR_DEPARTMENT_OTHER): Payer: Commercial Managed Care - HMO | Admitting: Physician Assistant

## 2016-09-20 ENCOUNTER — Ambulatory Visit (HOSPITAL_BASED_OUTPATIENT_CLINIC_OR_DEPARTMENT_OTHER): Payer: Commercial Managed Care - HMO | Admitting: Internal Medicine

## 2016-09-20 VITALS — BP 130/80 | HR 81 | Temp 96.0°F | Wt 149.0 lb

## 2016-09-20 DIAGNOSIS — J310 Chronic rhinitis: Secondary | ICD-10-CM

## 2016-09-20 DIAGNOSIS — E240 Pituitary-dependent Cushing's disease: Secondary | ICD-10-CM

## 2016-09-20 DIAGNOSIS — R002 Palpitations: Secondary | ICD-10-CM | POA: Insufficient documentation

## 2016-09-20 DIAGNOSIS — E559 Vitamin D deficiency, unspecified: Secondary | ICD-10-CM

## 2016-09-20 DIAGNOSIS — R0981 Nasal congestion: Secondary | ICD-10-CM

## 2016-09-20 LAB — CBC WITH PLATELET
HEMATOCRIT: 38.7 % (ref 34.1–44.9)
HEMOGLOBIN: 12.4 g/dL (ref 11.2–15.7)
MEAN CORP HGB CONC: 32 g/dL (ref 31.0–37.0)
MEAN CORPUSCULAR HGB: 27.9 pg (ref 26.0–34.0)
MEAN CORPUSCULAR VOL: 87 fL (ref 80.0–100.0)
MEAN PLATELET VOLUME: 10.6 fL (ref 8.7–12.5)
PLATELET COUNT: 320 10*3/uL (ref 150–400)
RBC DISTRIBUTION WIDTH STD DEV: 46.9 fL — ABNORMAL HIGH (ref 35.1–46.3)
RBC DISTRIBUTION WIDTH: 14.8 % — ABNORMAL HIGH (ref 11.5–14.3)
RED BLOOD CELL COUNT: 4.45 M/uL (ref 3.90–5.20)
WHITE BLOOD CELL COUNT: 5.1 10*3/uL (ref 4.0–11.0)

## 2016-09-20 MED ORDER — IPRATROPIUM BROMIDE 0.06 % NA SOLN
2.0000 | Freq: Four times a day (QID) | NASAL | 5 refills | Status: AC
Start: 2016-09-20 — End: 2017-03-23

## 2016-09-20 MED ORDER — FLUNISOLIDE 25 MCG/ACT (0.025%) NA SOLN: 2 | Bottle | Freq: Two times a day (BID) | NASAL | 6 refills | 0 days | Status: AC

## 2016-09-20 MED ORDER — IPRATROPIUM BROMIDE 0.06 % NA SOLN: 2 | mL | Freq: Four times a day (QID) | NASAL | 5 refills | 0 days | Status: AC

## 2016-09-20 MED ORDER — FLUNISOLIDE 25 MCG/ACT (0.025%) NA SOLN
2.0000 | Freq: Two times a day (BID) | NASAL | 6 refills | Status: AC
Start: 2016-09-20 — End: 2017-03-21

## 2016-09-20 NOTE — Progress Notes (Signed)
Kayla Garza is a 61 year old female with the following Problems and Medications.    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   Review of Patient's Allergies indicates:  No Known Allergies      Here for eval of palpitations  She feels them both day and night but more often at night  When she feels this it feels like heart is going fast  Lasts seconds to a minute  When she has this during the day she sometimes feels associated dyspnea  No chest pain  Not dizzy  These symptoms started a long time ago  She has the symptoms almost every day    Also wants to check sugar, lipids,   Vitamin D    Needs refill of nasal sprays for allergies    Past Medical History:  05/25/2012: Allergic conjunctivitis  No date: Breast lump      Comment: 1/00 Bx > fibroadenoma  1610: Helicobacter pylori (H. pylori)      Comment: s/p treatment for 2 months  05/25/2012: Hyperopia with astigmatism and presbyopia  1999: Other and unspecified ovarian cyst  05/25/2012: Pinguecula of both eyes  Past Surgical History:  1990: BRST RCNSTJ IMMT/DLYD W/TISS EXPANDER SBSQ XPN*      Comment: s/p removal of excess skin  1980: CRANIEC TREPHINE BONE FLP BRAIN TUMOR SUPRTENT*      Comment: craniotomy age 23, tumor pituitary  1997: HYSTERECTOMY      Comment: surgery for uterine fibroids. 7/01 Pap of  vag               cuff  Negative; ovaries remain    Family History    Osteoporosis Mother     Psychiatric Illness Brother     Comment: depression    Cancer - Breast FamHxNeg     Cancer - Colon FamHxNeg     Diabetes FamHxNeg     Heart FamHxNeg      Social History    Marital status: Married             Spouse name:                       Years of education:                 Number of children: 0             Occupational History  Occupation          Tax inspector  [employer]     Social History Main Topics    Smoking status: Never Smoker                                                                Alcohol use: No              Drug use: No              Sexual activity: Yes               Partners with: Female    Other Topics            Concern  Stress Concern          No  Exercise                Yes    Social History Narrative    Works with [employer], lives with husband, feels safe    From [place]    Arrived [place] 1985      Exam  BP 130/80  Pulse 81  Temp 96 F (35.6 C) (Temporal)  Wt 67.6 kg (149 lb)  SpO2 98%  BMI 27.77 kg/m2  YSA:YTKZS, nad  Heart:S1 and S2 normal, no murmurs, clicks, gallops or rubs. Regular rate and rhythm.   Lungs:Chest is clear, no wheezing or rales. Normal symmetric air entry throughout both lung fields.  Extr: no edema  Neuro: alert, gait normal,  Psych: normal mood, affect    EKG NSR    A/P  (R00.2) Palpitations  (primary encounter diagnosis)  Comment: refer for holter  Repeat tsh  Check cbc  Plan: LIPID PANEL, HEMOGLOBIN A1C, THYROID SCREEN TSH        REFLEX FT4, CBC WITH PLATELET, COLLECTION         VENOUS BLOOD VENIPUNCTURE, HOLTER MONITORING      (J31.0) Chronic rhinitis, unspecified type  Comment: renew rx that has worked well in past  Plan: Flunisolide 25 MCG/ACT (0.025%) SOLN nasal         spray        Plan: ipratropium (ATROVENT) 0.06 % nasal spray      (E55.9) Vitamin D deficiency  Comment:   Plan: VITAMIN D,25 HYDROXY          (E24.0) 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 All interim labs, test results, and consult notes were reviewed and discussed with Kayla Garza. Medications were reconciled during this visit and a current medication list was given to the patient at the end of the visit.  We discussed the patients current medications. The patient expressed understanding and no barriers to adherence were identified.

## 2016-09-21 ENCOUNTER — Encounter (HOSPITAL_BASED_OUTPATIENT_CLINIC_OR_DEPARTMENT_OTHER): Payer: Self-pay

## 2016-09-21 LAB — LIPID PANEL
Cholesterol: 179 mg/dL (ref 0–239)
HIGH DENSITY LIPOPROTEIN: 56 mg/dL (ref 40–?)
LOW DENSITY LIPOPROTEIN DIRECT: 104 mg/dL (ref 0–189)
TRIGLYCERIDES: 128 mg/dL (ref 0–150)

## 2016-09-21 LAB — HEMOGLOBIN A1C
ESTIMATED AVERAGE GLUCOSE: 111 (ref 74–160)
HEMOGLOBIN A1C: 5.5 % (ref 4.0–5.6)

## 2016-09-21 LAB — THYROID SCREEN TSH REFLEX FT4: THYROID SCREEN TSH REFLEX FT4: 2 u[IU]/mL (ref 0.358–3.740)

## 2016-09-21 LAB — VITAMIN D,25 HYDROXY: VITAMIN D,25 HYDROXY: 23 ng/mL — ABNORMAL LOW (ref 30.0–100.0)

## 2016-09-21 NOTE — Progress Notes (Signed)
Will ask RN to outreach  Labs all nl including lipids, glucose, TSH, blood count  Vitamin D slightly low. Suggest 1000 units daily- can buy otc or I can send rx if desired by patient

## 2016-09-22 ENCOUNTER — Telehealth (HOSPITAL_BASED_OUTPATIENT_CLINIC_OR_DEPARTMENT_OTHER): Payer: Self-pay

## 2016-09-22 DIAGNOSIS — E559 Vitamin D deficiency, unspecified: Secondary | ICD-10-CM

## 2016-09-22 MED ORDER — VITAMIN D 1000 UNITS PO TABS
1.0000 | ORAL_TABLET | Freq: Every day | ORAL | 12 refills | Status: DC
Start: 2016-09-22 — End: 2017-09-22

## 2016-09-22 MED ORDER — VITAMIN D 1000 UNITS PO TABS: 1 | tablet | Freq: Every day | 12 refills | 0 days | Status: AC

## 2016-09-22 NOTE — Progress Notes (Signed)
Due to language barrier, the phone call was conducted in [place] with an interpreter. The interpreter was on the phone during the enter phone call.      Pt satisfied with test results given  Pt request script sent to [facility].  No additional questions/concerns from pt.      Notes Recorded by Gordan Payment on 09/21/2016 at 1:50 PM EDT  Will ask RN to outreach  Labs all nl including lipids, glucose, TSH, blood count  Vitamin D slightly low. Suggest 1000 units daily- can buy otc or I can send rx if desired by patient

## 2016-09-29 LAB — EKG

## 2016-10-06 ENCOUNTER — Ambulatory Visit: Payer: Self-pay | Admitting: Internal Medicine

## 2016-10-06 DIAGNOSIS — R002 Palpitations: Secondary | ICD-10-CM

## 2016-10-06 NOTE — Addendum Note (Signed)
Addended by: Clemetine Marker on: 10/06/2016 02:01 PM     Modules accepted: Orders

## 2016-10-11 LAB — HOLTER MONITORING

## 2016-10-12 NOTE — Progress Notes (Signed)
Will ask RN to outreach   holter was normal with no abnormal rhythm

## 2016-10-13 ENCOUNTER — Telehealth (HOSPITAL_BASED_OUTPATIENT_CLINIC_OR_DEPARTMENT_OTHER): Payer: Self-pay | Admitting: General Practice

## 2016-10-13 NOTE — Progress Notes (Signed)
Called patient via telephone interpreter ([place]). Explained that holter monitor was normal with no abnormal rhythm. Patient confirmed understanding of results and had no further questions.

## 2016-10-13 NOTE — Telephone Encounter (Signed)
-----   Message from Delene Ruffini sent at 10/13/2016 11:56 AM EDT -----  Regarding: returning call   Contact: Rolette [phone], 61 year old, female    Calls today:  Clinical Questions (NON-SICK CLINICAL QUESTIONS ONLY)    Specific nature of request returning call   Return phone number [phone]  Person calling on behalf of patient: Patient (self)      Patient's language of care: [place]    Patient needs a [place] interpreter.    Patient's PCP: Huntley Estelle

## 2016-10-13 NOTE — Progress Notes (Signed)
Called patient via telephone interpreter ([place]). Left message on voicemail to call [facility]. Awaiting call back.     Notes Recorded by Gordan Payment on 10/12/2016 at 11:43 AM EDT  Will ask RN to outreach   holter was normal with no abnormal rhythm

## 2017-05-17 ENCOUNTER — Ambulatory Visit (HOSPITAL_BASED_OUTPATIENT_CLINIC_OR_DEPARTMENT_OTHER): Payer: Self-pay | Admitting: Endocrinology

## 2017-05-17 ENCOUNTER — Encounter (HOSPITAL_BASED_OUTPATIENT_CLINIC_OR_DEPARTMENT_OTHER): Payer: Self-pay | Admitting: Endocrinology

## 2017-05-17 VITALS — BP 128/84 | HR 90 | Temp 97.8°F | Wt 150.0 lb

## 2017-05-17 DIAGNOSIS — E240 Pituitary-dependent Cushing's disease: Secondary | ICD-10-CM

## 2017-05-17 DIAGNOSIS — E039 Hypothyroidism, unspecified: Secondary | ICD-10-CM

## 2017-05-17 DIAGNOSIS — E038 Other specified hypothyroidism: Secondary | ICD-10-CM

## 2017-05-17 LAB — PROLACTIN: PROLACTIN: 7 ng/mL (ref 2.2–30.3)

## 2017-05-17 LAB — FREE THYROXINE: FREE THYROXINE: 1.1 ng/dL (ref 0.76–1.46)

## 2017-05-17 LAB — TSH (THYROID STIMULATING HORMONE): TSH (THYROID STIM HORMONE): 2.08 u[IU]/mL (ref 0.358–3.740)

## 2017-05-17 NOTE — Progress Notes (Signed)
This is a 61yo woman who returns for annual f/u of a thyroid disorder:  HPI:  9/13 scalp hair loss and tired legs: TSH sent and found to be0.17  10/13 ultrasound thyroid heterogeneous hypervascular; no nodules; right and left lobes both 4 cm; thyroid scan non-focal  Methimazole 11/3-2/14 without improvement in symptoms  2/14 TSI 87; TPO 13; 50 g irregular goiter on exam  11/17 anti-TG negative    Also s/p transphenoidal surgery for Cushing's in [place] 1980; labs here 2012, 2014 showed normal postmenopausal FSH, LH and normal free thyroxine    I have reviewed the patient's medical, social, family history and meds in detail and updated the computerized patient record.    Due to the language barrier, the office visitwas conducted in [place] with an interpreter.The interpreter was on the phoneduring the entire visit.     Review of Systems   Constitutional: Negative for malaise/fatigue and weight loss.   Respiratory: Negative for cough and shortness of breath.    Cardiovascular: Positive for palpitations. Negative for chest pain and leg swelling.        Still feeling heart racing when lies down at night; changes position and it goes away   Genitourinary:        Hysterectomy age 29, ovaries intact; menopause symptoms started in late 40's   Neurological: Negative for dizziness, sensory change and weakness.   Psychiatric/Behavioral: Negative for depression. The patient does not have insomnia.    All other systems reviewed and are negative.  PHYSICAL EXAMINATION.  BP 128/84 (Site: LA, Position: Sitting, Cuff Size: Reg)  Pulse 90  Temp 97.8 F (36.6 C) (Temporal)  Wt 68 kg (150 lb)  SpO2 98%  BMI 27.96 kg/m2   No Cushingoid features  Pain Score: 0 (0/10)  Body mass index is 27.96 kg/m.  No acute distress. Well developed, well-nourished.   Eyes: Full extraocular movements. No proptosis, lid lag, or stare.   Neck: 40 g firm slightly irregular goiter    No cervical lymphadenopathy appreciated.   Cardiovascular:  Regular rate and rhythm, normal S1 and S2. No murmur/gallop/rub heard.  Respiratory: Clear to auscultation and percussion. Negative for respiratory distress/rales.   Musculoskeletal: Without clubbing, cyanosis or edema.   Skin: No striae or ecchymoses  Neurological: Intact with no tremor of the outstretched hands.  Psychiatric: Normal affect and responsiveness to questions.          THYROID FLOWSHEET Latest Ref Rng & Units 09/20/2016 05/18/2016   Thyroid Screen TSH 0.358 - 3.740 uIU/mL 2.000 2.170   Thyroid Stim Hormone 0.358 - 3.740 uIU/mL     Free Thyroxine 0.76 - 1.46 ng/dL     Anti Microsomal Antibody (TPO) 0 - 34 IU/mL     Anti-thyroglobulin AB 0.0 - 0.9 IU/mL  <1.0   TSI 0 - 139 %       THYROID FLOWSHEET Latest Ref Rng & Units 02/06/2016   Thyroid Screen TSH 0.358 - 3.740 uIU/mL 3.920 (H)   Thyroid Stim Hormone 0.358 - 3.740 uIU/mL    Free Thyroxine 0.76 - 1.46 ng/dL 1.05   Anti Microsomal Antibody (TPO) 0 - 34 IU/mL    Anti-thyroglobulin AB 0.0 - 0.9 IU/mL    TSI 0 - 139 %        ASSESSMENT:   1. Goiter, hx of subclinical thyroid dysfunction, ultrasound suggestive of Hashimoto's but antibodies negative  2. Remote hx of pituitary Cushing's by patient report, no evidence of recurrence or of hypopituitarism    RECOMMEND:  1. TSH   2. Free T4, prolactin, IGF-1 to complete eval of pituitary function  3. F/u 1 year

## 2017-05-17 NOTE — Progress Notes (Signed)
Patient feels physically safe at home.

## 2017-05-20 LAB — SOMATOMEDIN C (IGF-1): SOMATOMEDIN C (IGF-1): 92 ng/mL (ref 46–172)

## 2017-05-23 ENCOUNTER — Encounter (HOSPITAL_BASED_OUTPATIENT_CLINIC_OR_DEPARTMENT_OTHER): Payer: Self-pay | Admitting: Endocrinology

## 2017-08-01 ENCOUNTER — Other Ambulatory Visit (HOSPITAL_BASED_OUTPATIENT_CLINIC_OR_DEPARTMENT_OTHER): Payer: Self-pay

## 2017-08-01 NOTE — Telephone Encounter (Signed)
Planned Care Outreach  Call made to [facility] an appointmentfor a breast cancer screening (mammogram). Kayla Garza (name) present as interpreter in [place].   Patient (self) did not answer at [phone] ; this team member left a message asking for a call back.  IF PATIENT CALL us BACK HAVE PATIENT CALL OR HELP PATIENT WITH THREE WAY CALL BY CALLING  RADIOLOGY AT [phone] TO BOOK ROUTINE SCREENING MAMMOGRAM WHICH IS not due till 01/06/18 so book after this date.  (OFFER PATIENT INTERPRETER IF ANY NEEDED.)      I have completed the following communication and reminder steps: Patient notified by telephone  Kayla Garza, [place], 08/01/2017, 2:33 PM    On behalf of NKE:[id number] Engelbart,MD

## 2017-10-25 ENCOUNTER — Other Ambulatory Visit (HOSPITAL_BASED_OUTPATIENT_CLINIC_OR_DEPARTMENT_OTHER): Payer: Self-pay

## 2017-10-25 DIAGNOSIS — Z1239 Encounter for other screening for malignant neoplasm of breast: Secondary | ICD-10-CM

## 2017-10-25 NOTE — Telephone Encounter (Signed)
Planned Care Outreach  Call made to [facility] an appointmentfor a breast cancer screening (mammogram). [place] present as interpreter in [place].   Patient (self) answered at [phone] ; appointment booked.    01/12/2018 AT 4 PM AT [facility] PER PATIENT CHOICE.    I have completed the following communication and reminder steps: Patient notified by telephone  Harlon Flor, [place], 10/25/2017, 4:13 PM    On behalf of TCC:EQFDVO Engelbart,MD

## 2017-10-25 NOTE — Addendum Note (Signed)
Addended by: Wolfgang Phoenix on: 10/25/2017 04:33 PM     Modules accepted: Orders

## 2018-01-12 ENCOUNTER — Ambulatory Visit: Payer: Self-pay | Admitting: Internal Medicine

## 2018-01-12 ENCOUNTER — Ambulatory Visit (HOSPITAL_BASED_OUTPATIENT_CLINIC_OR_DEPARTMENT_OTHER): Payer: BC Managed Care – PPO

## 2018-01-12 ENCOUNTER — Ambulatory Visit (HOSPITAL_BASED_OUTPATIENT_CLINIC_OR_DEPARTMENT_OTHER): Payer: Self-pay

## 2018-01-19 ENCOUNTER — Ambulatory Visit
Admission: RE | Admit: 2018-01-19 | Discharge: 2018-01-19 | Disposition: A | Discharge status: Home / Self Care | Payer: BC Managed Care – PPO | Attending: Physician Assistant | Admitting: Physician Assistant

## 2018-01-19 ENCOUNTER — Encounter (HOSPITAL_BASED_OUTPATIENT_CLINIC_OR_DEPARTMENT_OTHER): Payer: Self-pay

## 2018-01-19 DIAGNOSIS — Z1239 Encounter for other screening for malignant neoplasm of breast: Secondary | ICD-10-CM

## 2018-01-19 DIAGNOSIS — Z9289 Personal history of other medical treatment: Secondary | ICD-10-CM | POA: Insufficient documentation

## 2018-01-19 DIAGNOSIS — Z1231 Encounter for screening mammogram for malignant neoplasm of breast: Secondary | ICD-10-CM | POA: Diagnosis present

## 2018-01-20 DIAGNOSIS — Z1231 Encounter for screening mammogram for malignant neoplasm of breast: Secondary | ICD-10-CM

## 2018-02-27 ENCOUNTER — Ambulatory Visit: Payer: BC Managed Care – PPO | Attending: Internal Medicine | Admitting: Internal Medicine

## 2018-02-27 ENCOUNTER — Encounter (HOSPITAL_BASED_OUTPATIENT_CLINIC_OR_DEPARTMENT_OTHER): Payer: Self-pay | Admitting: Internal Medicine

## 2018-02-27 VITALS — BP 122/74 | HR 77 | Temp 97.7°F | Wt 156.6 lb

## 2018-02-27 DIAGNOSIS — L814 Other melanin hyperpigmentation: Secondary | ICD-10-CM | POA: Insufficient documentation

## 2018-02-27 DIAGNOSIS — L989 Disorder of the skin and subcutaneous tissue, unspecified: Secondary | ICD-10-CM | POA: Diagnosis not present

## 2018-02-27 NOTE — Progress Notes (Signed)
Kayla Garza is a 62 year old female with the following Problems and Medications.    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      Review of Patient's Allergies indicates:  No Known Allergies           here with "marks" on the body  They are "red"  Noticed a year ago  Tiny red dots  They grow and become like a blister  Then burst and leave a rough spot which is brown and then fades and resolves after about 2 months  She points to one on her right upper chest and one on the right upper abdomen- these are the brown lesions that eventually fade  She has no new red or "blister" lesions  They can happen all over the body  When these red dots come up they are just slightly itchy  She is otherwise fine  No fever, weight loss, cough, sneezing, swelling, dyspnea, GI sx    Otherwise just feels hot by her ears  This started years ago  Might happen at moments  Not like menopausal hot flash  Just feels her ears are burning  Not happening right now  No chest pain  Seeing endo each year to fu on thyroid  Remote hx of cushing disease sp surgery    I have reviewed the past medical, surgical, social and family history and updated these sections of EpicCare as relevant. All interim labs, test results, and consult notes were reviewed and discussed  with [name] [facility]. Medications were reconciled during this visit and a current medication list was given to the patient at the end of the visit.  We discussed the patients current medications. The patient expressed understanding and no barriers to adherence were identified.                  BP 122/74  Pulse 77  Temp 97.7 F (36.5 C) (Temporal)  Wt 71 kg (156 lb 9.6 oz)  SpO2 98%  BMI 29.59 kg/m2  Right cheek with small light brown macule c/w solar lentigo  Right lateral upper chest and right upper abdomen with 1 cm faint light brown macule  Back, abdomen, chest, arms, face with no other skin lesions noted  Heart:S1 and S2 normal, no murmurs, clicks, gallops or rubs. Regular rate and rhythm.   Lungs:Chest is clear, no wheezing or rales. Normal symmetric air entry throughout both lung fields.  Ears: normal external ears and canals    A/P  (L98.9) Skin lesion  (primary encounter diagnosis)  Comment: she  will take photo or schedule same day apt the next time she has a red "blister" type of lesion  Currently only has 2 lesions that appear most c/w post inflammatory hyperpigmentation      (L81.4) Solar lentigo  Comment: reviewed that lesion on cheek, hands c/w solar lentigos  Reviewed sun protection      25 minutes spent with patient, more than 50% of visit was spent counseling the patient      I have reviewed the past medical, surgical, social and family history and updated these sections of EpicCare as relevant. All interim labs, test results, and consult notes were reviewed and discussed with [name] [facility]. Medications were reconciled during this visit and a current medication list was given to the patient at the end of the visit.  We discussed the patients current medications. The patient expressed understanding and no barriers to adherence were identified.

## 2018-05-11 ENCOUNTER — Encounter (HOSPITAL_BASED_OUTPATIENT_CLINIC_OR_DEPARTMENT_OTHER): Payer: Self-pay | Admitting: Internal Medicine

## 2018-05-11 ENCOUNTER — Ambulatory Visit: Payer: BC Managed Care – PPO | Attending: Internal Medicine | Admitting: Internal Medicine

## 2018-05-11 VITALS — BP 139/74 | HR 77 | Temp 98.3°F | Ht 61.0 in | Wt 156.6 lb

## 2018-05-11 DIAGNOSIS — Z23 Encounter for immunization: Secondary | ICD-10-CM | POA: Insufficient documentation

## 2018-05-11 DIAGNOSIS — Z13228 Encounter for screening for other metabolic disorders: Secondary | ICD-10-CM | POA: Insufficient documentation

## 2018-05-11 DIAGNOSIS — E559 Vitamin D deficiency, unspecified: Secondary | ICD-10-CM | POA: Diagnosis not present

## 2018-05-11 DIAGNOSIS — L989 Disorder of the skin and subcutaneous tissue, unspecified: Secondary | ICD-10-CM | POA: Insufficient documentation

## 2018-05-11 DIAGNOSIS — E0590 Thyrotoxicosis, unspecified without thyrotoxic crisis or storm: Secondary | ICD-10-CM | POA: Insufficient documentation

## 2018-05-11 DIAGNOSIS — Z0000 Encounter for general adult medical examination without abnormal findings: Secondary | ICD-10-CM | POA: Diagnosis present

## 2018-05-11 LAB — COMP METABOLIC PANEL, FASTING
ALANINE AMINOTRANSFERASE: 26 U/L (ref 12–45)
ALBUMIN: 4.1 g/dL (ref 3.4–5.0)
ALKALINE PHOSPHATASE: 125 U/L — ABNORMAL HIGH (ref 45–117)
ANION GAP: 5 mmol/L (ref 5–15)
ASPARTATE AMINOTRANSFERASE: 19 U/L (ref 8–34)
BILIRUBIN TOTAL: 0.2 mg/dL (ref 0.2–1.0)
BUN (UREA NITROGEN): 15 mg/dL (ref 7–18)
CALCIUM: 9.1 mg/dL (ref 8.5–10.1)
CARBON DIOXIDE: 28 mmol/L (ref 21–32)
CHLORIDE: 105 mmol/L (ref 98–107)
CREATININE: 0.8 mg/dL (ref 0.4–1.2)
ESTIMATED GLOMERULAR FILT RATE: >60 mL/min (ref >60)
GLUCOSE FASTING: 102 mg/dL (ref 74–106)
POTASSIUM: 4.1 mmol/L (ref 3.5–5.1)
SODIUM: 138 mmol/L (ref 136–145)
TOTAL PROTEIN: 7.5 g/dL (ref 6.4–8.2)

## 2018-05-11 LAB — LIPID PANEL
Cholesterol: 209 mg/dL (ref 0–239)
HIGH DENSITY LIPOPROTEIN: 70 mg/dL (ref 40–?)
LOW DENSITY LIPOPROTEIN DIRECT: 111 mg/dL (ref 0–189)
TRIGLYCERIDES: 130 mg/dL (ref 0–150)

## 2018-05-11 LAB — THYROID SCREEN TSH REFLEX FT4: THYROID SCREEN TSH REFLEX FT4: 2.51 u[IU]/mL (ref 0.358–3.740)

## 2018-05-11 LAB — VITAMIN D,25 HYDROXY: VITAMIN D,25 HYDROXY: 27 ng/mL — ABNORMAL LOW (ref 30.0–100.0)

## 2018-05-11 NOTE — Progress Notes (Addendum)
Patient was seen with NP student Augustina Mood, RN.   I confirm the key elements of history and physical exam. The plan was discussed and reviewed with NP student. The note was written in collaboration with NP student.       SUBJECTIVE:  Kayla Garza is a 62 year old female, [place] speaking, transferring care from [employer]. Last visit in Sept/19.     c/o  #) Has had 3-5 episodes of left sided back pain, sharp, intermittent. Unclear when it started, feels it's becoming more frequent in the past months. Episodes last a short time, maybe less than 1hr. No clear triggers. May happen at work, but sometimes when she's not working. Focal pain. She works in housekeeping, Education administrator, at casino. Job requires heavy lifting.   Has not tried anything for the pain.  Pain does not interfere with activities.     #) Skin lesion  Saw previous PCP, Dr Fleet Contras on  9/9 for a skin lesion on R arm. Describes the area as round, dry skin, with no drainage. Appears like a blister, but no fluid inside. Red on onset, a few days later it dries out, peels off, and new skin underneath. Usually isolated lesions, in different parts of the body.  Denies skin contact with chemicals, denies direct sun exposure.   Onset about 1 yr ago, noticed they have been more frequent in the past 4 months.   Had no lesions at the time she was evaluated by Dr Normajean Glasgow. Pt was instructed to take a picture of the lesion, and call for same day appt for further assessment when they appear  She has not done so.     #) HM - Declines Flu shot. Agrees to Td and labs    Review of Patient's Allergies indicates:  No Known Allergies    Current Outpatient Medications on File Prior to Visit:  cholecalciferol (VITAMIN D3) 1000 UNIT tablet Take 1 tablet by mouth daily Disp: 30 tablet Rfl: 12     No current facility-administered medications on file prior to visit.   Patient Active Problem List:     Overweight (BMI 25.0-29.9)     Vitamin D deficiency     Ovarian cyst      Allergic rhinitis     Renal anomaly     Hyperthyroidism     Pinguecula of both eyes     Allergic conjunctivitis     Hyperopia with astigmatism and presbyopia     History of Cushing disease     Osteoarthritis of both knees     Coccyx pain     Laryngopharyngeal reflux (LPR)     Subclinical hypothyroidism     Palpitations    Past Medical History:  05/25/2012: Allergic conjunctivitis  No date: Breast lump      Comment:  1/00 Bx > fibroadenoma  5277: Helicobacter pylori (H. pylori)      Comment:  s/p treatment for 2 months  05/25/2012: Hyperopia with astigmatism and presbyopia  1999: Other and unspecified ovarian cyst  05/25/2012: Pinguecula of both eyes  Past Surgical History:  No date: BREAST BIOPSY      Comment:  negative right  needle Bx  1990: BRST RCNSTJ IMMT/DLYD W/TISS EXPANDER SBSQ XPNSJ      Comment:  s/p removal of excess skin  1980: CRANIEC TREPHINE BONE FLP BRAIN TUMOR SUPRTENTOR      Comment:  craniotomy age 31, tumor pituitary  1997: HYSTERECTOMY      Comment:  surgery  for uterine fibroids. 7/01 Pap of  vag cuff                Negative; ovaries remain  No date: REDUCTION MAMMAPLASTY  Review of patient's family history indicates:  Problem: Osteoporosis      Relation: Mother          Age of Onset: (Not Specified)  Problem: Psychiatric Illness      Relation: Brother          Age of Onset: (Not Specified)          Comment: depression  Problem: Cancer - Breast      Relation: FamHxNeg          Age of Onset: (Not Specified)  Problem: Cancer - Colon      Relation: FamHxNeg          Age of Onset: (Not Specified)  Problem: Diabetes      Relation: FamHxNeg          Age of Onset: (Not Specified)  Problem: Heart      Relation: FamHxNeg          Age of Onset: (Not Specified)    Social History     Socioeconomic History    Marital status: Married     Spouse name: Not on file    Number of children: 0    Years of education: Not on file    Highest education level: Not on file   Occupational History     Employer: [employer]   Social Needs    Financial resource strain: Not on file    Food insecurity:     Worry: Not on file     Inability: Not on file    Transportation needs:     Medical: Not on file     Non-medical: Not on file   Tobacco Use    Smoking status: Never Smoker    Smokeless tobacco: Never Used   Substance and Sexual Activity    Alcohol use: No    Drug use: No    Sexual activity: Yes     Partners: Male   Lifestyle    Physical activity:     Days per week: Not on file     Minutes per session: Not on file    Stress: Not on file   Relationships    Social connections:     Talks on phone: Not on file     Gets together: Not on file     Attends religious service: Not on file     Active member of club or organization: Not on file     Attends meetings of clubs or organizations: Not on file     Relationship status: Not on file    Intimate partner violence:     Fear of current or ex partner: Not on file     Emotionally abused: Not on file     Physically abused: Not on file     Forced sexual activity: Not on file   Other Topics Concern    Military Service Not Asked    Blood Transfusions Not Asked    Caffeine Concern Not Asked    Occupational Exposure Not Asked    Hobby Hazards Not Asked    Sleep Concern Not Asked    Stress Concern No    Weight Concern Not Asked    Special Diet Not Asked    Back Care Not Asked    Exercise Yes  Bike Helmet Not Asked    Seat Belt Not Asked    Self-Exams Not Asked   Social History Narrative    Works with [employer], lives with husband, feels safe    From [place]    Arrived [place] 1985    No children        Gordan Payment, MD, 02/27/2018    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 No visual changes. No sore throat or ear ache.  No prolonged cough. No dyspnea or chest pain on exertion.  No abdominal pain or change in bowel habits.  No vaginal discharge or dysuria.  No new or unusual musculoskeletal symptoms.  Skin lesion as above.  No pain or masses in breasts. No paresthesias or unusual headaches. No sadness or anxiety that interferes with day-to-day activities. No hot flashes. No enlarged nodes.  No new itching, sneezing or wheezing.    OBJECTIVE:  BP 139/74 (Site: RA, Position: Sitting, Cuff Size: Reg)  Pulse 77  Temp 98.3 F (36.8 C) (Oral)  Ht 5\' 1"  (1.549 m)  Wt 71 kg (156 lb 9.6 oz)  SpO2 100%  BMI 29.59 kg/m2    Pleasant, in NAD.     Skin: warm, dry, intact, areas of dry skin on R chest wall and R arm, circular localized areas, no redness or drainage.   HEENT: Normocephalic, tympanic membrane pearly gray, no redness or bulging. Nares patent, no drainage or redness. Tongue midline, uvula midline, no redness, moist.  Cardiac: S1 and S2 heard. No rubs, clicks or murmurs. Regular rate and rhythm.  Lungs: Clear upon auscultation, no rales, wheezes or rhonchi.   Musculoskeletal: Full ROM, no limitations with moving extremities, no tenderness, normal gait.     ASSESSMENT & PLAN:  (Z00.00) Encounter for preventive health examination  (primary encounter diagnosis)  Comment: Healthy 62 yo female transferring care from [facility], here to establish care with new PCP. Due for routine check up  with unremarkable physical exam (except as noted below). Routine screening tests reviewed/ordered. HM updated.   For general health, stay well hydrated, eat healthy at regular intervals (foods rich in dietary fiber, low sugar concentration, minimally processed are always preferred), sleep at least 8h at night, exercise regularly.    (Z13.228) Screening for metabolic disorder  Comment: Pt was very concerned she could have "something from within coming to the skin". Will do a general metabolic screening given her deep concern about it.  Plan: COMP METABOLIC PANEL, FASTING, HEMOGLOBIN A1C,         LIPID PANEL            (E55.9) Vitamin D deficiency  Comment:   Plan: VITAMIN D,25 HYDROXY, COLLECTION VENOUS BLOOD         VENIPUNCTURE             (E05.90) Hyperthyroidism  Comment: Under care of endocrinology, has routine FU next week. Check TSH in preparation to visit.   Plan: THYROID SCREEN TSH REFLEX FT4            (Z23) Need for prophylactic vaccination with tetanus-diphtheria (Td)  Comment:   Plan: IMMUNIZATION ADMIN SINGLE, TD VACCINE PRSRV         FREE 7 YRS OR OLDER FOR IM USE, IIV4 VACC         PRESERV FREE AGE 36 MONTHS AND OLDER, 0.5ML, IM            (  L98.9) Skin lesion  Comment: Unclear etiology, and no lesions on exam. Notes from Dr Normajean Glasgow reviewed, and recommendation is reinforced (take picture, try to schedule same day visit). Pt reassured that her description of lesions (as above) do not suggest malignancy or anything very concerning.   Plan: monitor      #) Back pain  Comment: Patient reports that she has been having this back pain for years, but recently has been feeling randomly sharp moments that go away right after on her left posterior back. States to work many hours 5 days a week at the [employer] in [place].  Plan: Likely MSK. Conservative mgmt,  warm compresses when laying down after work, educated on reporting any worsening of back pain or change in symptoms. Pt prefers to avoid medicaiton      The patient was ready to learn and no apparent learning or adherence barriers were identified. I explained the diagnosis and treatment plan, and the patient expressed understanding of the content. I attempted to answer any questions regarding the diagnosis and the proposed treatment.    We discussed the patients current medications. The patient expressed understanding and no barriers to adherence were identified.    follow-up will be scheduled prn  she has been advised to call or return with any worsening or new problems

## 2018-05-11 NOTE — Progress Notes (Signed)
05/11/2018  VIS given prior to administration and reviewed with the patient and or legal guardian. Patient understands the disease and the vaccine. See immunization/Injection module or chart review for date of publication and additional information.  Lawernce Pitts, APRN

## 2018-05-12 LAB — HEMOGLOBIN A1C
ESTIMATED AVERAGE GLUCOSE: 111 (ref 74–160)
HEMOGLOBIN A1C: 5.5 % (ref 4.0–5.6)

## 2018-05-16 ENCOUNTER — Encounter (HOSPITAL_BASED_OUTPATIENT_CLINIC_OR_DEPARTMENT_OTHER): Payer: Self-pay | Admitting: Endocrinology

## 2018-05-16 ENCOUNTER — Ambulatory Visit: Payer: BC Managed Care – PPO | Attending: Internal Medicine | Admitting: Endocrinology

## 2018-05-16 VITALS — BP 140/81 | HR 63 | Wt 155.0 lb

## 2018-05-16 DIAGNOSIS — Z8639 Personal history of other endocrine, nutritional and metabolic disease: Secondary | ICD-10-CM | POA: Diagnosis not present

## 2018-05-16 DIAGNOSIS — E079 Disorder of thyroid, unspecified: Secondary | ICD-10-CM | POA: Diagnosis not present

## 2018-05-16 NOTE — Progress Notes (Signed)
This is a 62yo woman who returns for annual f/u of a thyroid disorder:  HPI:  9/13 scalp hair loss and tired legs: TSH sent and found to be0.17  10/13 ultrasound thyroid heterogeneous hypervascular; no nodules; right and left lobes both 4 cm; thyroid scan non-focal  Methimazole 11/3-2/14 without improvement in symptoms  2/14 TSI 87; TPO 13; 50 g irregular goiter on exam  11/17 anti-TG negative    Also s/p transphenoidal surgery for Cushing's in [place] 1980; labs here 2012, 2014 showed normal postmenopausal FSH, LH and normal free thyroxine    I have reviewed the patient's medical, social, family history and meds in detail and updated the computerized patient record.    Due to the language barrier, the office visitwas conducted in [place] with an interpreter.The interpreter was on the phoneduring the entire visit.     Review of Systems   HENT:        No dysphagia, anterior neck pain or swelling     Respiratory: Negative for cough and shortness of breath.    Cardiovascular: Negative for chest pain, palpitations and leg swelling.   Neurological: Positive for dizziness.        Gets dizzy 2-3x/day; feels like spinning, duration <2 min ; has been having this problem for several years; does not feel like will lose balance, has never fallen or passed out from this                                                                                                                   All other systems reviewed and are negative.    PHYSICAL EXAMINATION.  BP 140/81 (Site: LA, Position: Sitting, Cuff Size: Reg)  Pulse 63  Wt 70.3 kg (155 lb)  SpO2 98%  BMI 29.29 kg/m2  Pain Score: 0 (0/10)  Body mass index is 29.29 kg/m.  No acute distress. Well developed, well-nourished. No Cushingoid features  Eyes: Full extraocular movements. No proptosis, lid lag, or stare.   Neck:.40 g firm slightly irregular goiter     No cervical lymphadenopathy appreciated.   Cardiovascular: Regular rate and rhythm, normal S1 and S2. No  murmur/gallop/rub heard.  Respiratory: Clear to auscultation and percussion. Negative for respiratory distress/rales.   Musculoskeletal: Without clubbing, cyanosis or edema.   Skin: Cool and dry to touch without obvious lesions. No acanthosis, acne, hirsutism. Non-violaceous striae abdomen and axillae  Neurological: Intact with no tremor of the outstretched hands.  Psychiatric: Normal affect and responsiveness to questions.          THYROID FLOWSHEET Latest Ref Rng & Units 05/11/2018 05/17/2017   Thyroid Screen TSH 0.358 - 3.740 uIU/mL 2.510    Thyroid Stim Hormone 0.358 - 3.740 uIU/mL  2.080   Free Thyroxine 0.76 - 1.46 ng/dL  1.10   Anti Microsomal Antibody (TPO) 0 - 34 IU/mL     Anti-thyroglobulin AB 0.0 - 0.9 IU/mL     TSI 0 - 139 %       ASSESSMENT:  1. 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 Hx of Cushing's disease, no clinical evidence of recurrence or of other pituitary hormone deficiency    RECOMMEND:   1. F/u 1 year

## 2018-05-16 NOTE — Progress Notes (Signed)
Patient feels physically safe at home.

## 2018-06-13 ENCOUNTER — Encounter (HOSPITAL_BASED_OUTPATIENT_CLINIC_OR_DEPARTMENT_OTHER): Payer: Self-pay | Admitting: Internal Medicine

## 2018-06-13 DIAGNOSIS — E559 Vitamin D deficiency, unspecified: Secondary | ICD-10-CM

## 2018-06-13 MED ORDER — CHOLECALCIFEROL 25 MCG (1000 UT) PO TABS: 1000 [IU] | tablet | Freq: Every day | ORAL | 11 refills | 0 days | Status: AC

## 2018-06-13 MED ORDER — CHOLECALCIFEROL 25 MCG (1000 UT) PO TABS
1000.0000 [IU] | ORAL_TABLET | Freq: Every day | ORAL | 11 refills | Status: DC
Start: 2018-06-13 — End: 2019-04-06

## 2018-10-16 ENCOUNTER — Ambulatory Visit: Payer: BC Managed Care – PPO | Attending: Family Medicine | Admitting: Family Medicine

## 2018-10-16 ENCOUNTER — Other Ambulatory Visit: Payer: Self-pay

## 2018-10-16 DIAGNOSIS — K645 Perianal venous thrombosis: Secondary | ICD-10-CM | POA: Diagnosis not present

## 2018-10-16 MED ORDER — POLYETHYLENE GLYCOL 3350 PO PACK: 17 g | Package | Freq: Every day | ORAL | 1 refills | 0 days | Status: AC

## 2018-10-16 MED ORDER — POLYETHYLENE GLYCOL 3350 17 G PO PACK
17.00 g | PACK | Freq: Every day | ORAL | 1 refills | Status: AC
Start: 2018-10-16 — End: 2018-11-15

## 2018-10-16 NOTE — Televisit Note (Signed)
Subjective:  Kayla Garza is a 63 year old female    Patient  with chief complaint:     Hemorrhoid  - bigger and more painful for the past 4-5 days  - not very painful, somewhat painful and irritated  - no fevers  - no pain with bowel movement  - no blood in stool  - chronic constipation, takes a laxative from [place] three times a week        Patients medical, surgical, social history reviewed    Allergies were reviewed      Objective:    Deferred, please see televisit documentation below         Assessment/Plan:      1. External thrombosed hemorrhoids  - warm baths and sitz baths many times a day  - miralax sent to the pharmacy, stop using any laxative form [place] if using miralax  - for any worsening symptoms must call the office back right away         The patient indicates understanding of these issues and agrees with the plan.            This patient was identified as meeting criteria for a televisit rather than an in person visit due to public health concerns around COVID-19. A complete assessment and plan is detailed in the note, all of which were conducted remotely using telephone technology.  Patient identity was verbally confirmed by the patient/guardian with 2 identifiers (name, date of birth, and/or address) at the beginning of the visit  Patient/guardian verbally consented to care by televisit as appropriate.   Patient/guardian was located home during the visit and confirmed that they understood they were encouraged to be in private location due to personal health information being discussed.  Patient/guardian was informed how to access face-to-face care in the event of an emergency.  Provider was located in a [facility] exam room during the visit.  If this is a new patient visit, all available records and medical history were reviewed by the provider.  Visit length was 15 minutes and counseling was done on the diagnoses indicated in the visit.

## 2018-12-27 ENCOUNTER — Telehealth (HOSPITAL_BASED_OUTPATIENT_CLINIC_OR_DEPARTMENT_OTHER): Payer: Self-pay | Admitting: Family Medicine

## 2018-12-27 DIAGNOSIS — Z20822 Contact with and (suspected) exposure to covid-19: Secondary | ICD-10-CM

## 2018-12-27 DIAGNOSIS — Z20828 Contact with and (suspected) exposure to other viral communicable diseases: Secondary | ICD-10-CM

## 2018-12-27 NOTE — Progress Notes (Signed)
covid 19

## 2018-12-28 ENCOUNTER — Ambulatory Visit
Admission: RE | Admit: 2018-12-28 | Discharge: 2018-12-28 | Disposition: A | Discharge status: Home / Self Care | Payer: BC Managed Care – PPO | Attending: Family Medicine | Admitting: Family Medicine

## 2018-12-28 DIAGNOSIS — Z20828 Contact with and (suspected) exposure to other viral communicable diseases: Secondary | ICD-10-CM

## 2018-12-28 DIAGNOSIS — Z20822 Contact with and (suspected) exposure to covid-19: Secondary | ICD-10-CM

## 2019-01-03 LAB — COVID-19 OUTPATIENT: COVID-19 OUTPATIENT: NEGATIVE

## 2019-04-06 ENCOUNTER — Other Ambulatory Visit (HOSPITAL_BASED_OUTPATIENT_CLINIC_OR_DEPARTMENT_OTHER): Payer: Self-pay | Admitting: Internal Medicine

## 2019-04-06 DIAGNOSIS — E559 Vitamin D deficiency, unspecified: Secondary | ICD-10-CM

## 2019-04-06 NOTE — Telephone Encounter (Signed)
[facility], Kayla Garza is a 63 year old female has requested a refill of vitamin d 1000.      Last Office Visit: 10-16-18 with Rosealee Albee  Last Physical Exam: 05-07-15    There are no preventive care reminders to display for this patient.    Other Med Adult:  Most Recent BP Reading(s)  05/16/18 : 140/81        Cholesterol (mg/dL)   Date Value   05/11/2018 209     LOW DENSITY LIPOPROTEIN DIRECT (mg/dL)   Date Value   05/11/2018 111     HIGH DENSITY LIPOPROTEIN (mg/dL)   Date Value   05/11/2018 70     TRIGLYCERIDES (mg/dL)   Date Value   05/11/2018 130         THYROID SCREEN TSH REFLEX FT4 (uIU/mL)   Date Value   05/11/2018 2.510         TSH (THYROID STIM HORMONE) (uIU/mL)   Date Value   05/17/2017 2.080       HEMOGLOBIN A1C (%)   Date Value   05/11/2018 5.5       No results found for: POCA1C      No results found for: INR    SODIUM (mmol/L)   Date Value   05/11/2018 138       POTASSIUM (mmol/L)   Date Value   05/11/2018 4.1           CREATININE (mg/dL)   Date Value   05/11/2018 0.8       Documented patient preferred pharmacies:    [facility] [place], [place] [facility] [address]  Phone: [phone] Fax: [phone]

## 2019-05-10 ENCOUNTER — Other Ambulatory Visit: Payer: Self-pay

## 2019-05-22 ENCOUNTER — Ambulatory Visit: Payer: BC Managed Care – PPO | Attending: Internal Medicine | Admitting: Endocrinology

## 2019-05-22 ENCOUNTER — Other Ambulatory Visit: Payer: Self-pay

## 2019-05-22 ENCOUNTER — Encounter (HOSPITAL_BASED_OUTPATIENT_CLINIC_OR_DEPARTMENT_OTHER): Payer: Self-pay | Admitting: Endocrinology

## 2019-05-22 VITALS — BP 152/80 | HR 84 | Temp 97.8°F | Wt 154.0 lb

## 2019-05-22 DIAGNOSIS — E049 Nontoxic goiter, unspecified: Secondary | ICD-10-CM | POA: Diagnosis not present

## 2019-05-22 DIAGNOSIS — Z8639 Personal history of other endocrine, nutritional and metabolic disease: Secondary | ICD-10-CM | POA: Diagnosis not present

## 2019-05-22 NOTE — Progress Notes (Signed)
Patient feels physically safe at home.

## 2019-05-22 NOTE — Progress Notes (Signed)
Presents for annual f/u of a thyroid disorder:  HPI:  9/13 scalp hair loss and tired legs: TSH sent and found to be0.17  10/13 ultrasound thyroid heterogeneous hypervascular; no nodules; right and left lobes both 4 cm; thyroid scan non-focal  Methimazole 11/13-2/14 without improvement in symptoms  2/14 TSI 87; TPO 13; 50 g irregular goiter on exam  11/17 anti-TG negative    Also s/p transphenoidal surgery for Cushing's in [place] 1980, without signs of recurrent Cushings on exam or labs and in 2012, 2014had normal pituitary function: normal postmenopausal FSH, LH, IGF-1 and free thyroxine.     I have reviewed the patient's medical, social, family history and meds in detail and updated the computerized patient record.    Due to the language barrier, the office visitwas conducted in [place] with an interpreter.The interpreter was on the phoneduring the entire visit.     Reports no fatigue, chest pain, SOB, myalgias, palpitations, dysphagia or dizziness.  All other ROS reviewed and confirmed as negative.    PHYSICAL EXAMINATION.  BP 152/80 (Site: LA, Position: Sitting, Cuff Size: Reg)    Pulse 84    Temp 97.8 F (36.6 C) (Temporal)    Wt 69.9 kg (154 lb)    SpO2 99%    BMI 29.10 kg/m   Pain Score: Data Unavailable  Body mass index is 29.1 kg/m.  No acute distress. Well developed, well-nourished.   Eyes: Full extraocular movements. No proptosis, lid lag, or stare.   Neck:  40 g firm slightly irregular but symmetric goiter     No cervical lymphadenopathy appreciated.   Cardiovascular: Regular rate and rhythm, normal S1 and S2. No murmur/gallop/rub heard.  Respiratory: Clear to auscultation and percussion. Negative for respiratory distress/rales.   Musculoskeletal: Without clubbing, cyanosis or edema.   Skin: Cool and dry to touch without obvious lesions.   Neurological: Intact with no tremor of the outstretched hands.  Psychiatric: Normal affect and responsiveness to questions.             THYROID  FLOWSHEET Latest Ref Rng & Units 05/11/2018 05/17/2017   Thyroid Screen TSH 0.358 - 3.740 uIU/mL 2.510    Thyroid Stim Hormone 0.358 - 3.740 uIU/mL  2.080   Free Thyroxine 0.76 - 1.46 ng/dL  1.10   Anti Microsomal Antibody (TPO) 0 - 34 IU/mL     Anti-thyroglobulin AB 0.0 - 0.9 IU/mL     TSI 0 - 139 %       HEMOGLOBIN A1C (%)   Date Value   05/11/2018 5.5   09/20/2016 5.5   05/07/2015 5.3      Ref. Range 05/17/2017 16:33   FREE THYROXINE Latest Ref Range: 0.76 - 1.46 ng/dL 1.10   TSH (THYROID STIM HORMONE) Latest Ref Range: 0.358 - 3.740 uIU/mL 2.080   PROLACTIN Latest Ref Range: 2.2 - 30.3 ng/mL 7.0   SOMATOMEDIN C (IGF-1) Latest Ref Range: 46 - 172 ng/mL 92     ASSESSMENT:   1. Goiter, unchanged. Ultrasound features in the past suggested Hashimoto's, but all antibody studies have been negative.   2. Briefly had sc hyperthyroidism in past, but treatment had no effect on presenting complaints of tired legs and scalp hair thinning. Since 2013, TFTs have been wnl without medication    RECOMMEND:   1. TSH screen ordered and FD will book lab appt at [facility]  2. F/u  1 yr assuming TSH normal

## 2019-05-29 ENCOUNTER — Other Ambulatory Visit: Payer: Self-pay

## 2019-05-29 ENCOUNTER — Ambulatory Visit
Admission: RE | Admit: 2019-05-29 | Discharge: 2019-05-29 | Disposition: A | Discharge status: Home / Self Care | Payer: BC Managed Care – PPO | Attending: Endocrinology | Admitting: Endocrinology

## 2019-05-29 DIAGNOSIS — Z8639 Personal history of other endocrine, nutritional and metabolic disease: Secondary | ICD-10-CM | POA: Diagnosis not present

## 2019-05-29 LAB — THYROID SCREEN TSH REFLEX FT4: THYROID SCREEN TSH REFLEX FT4: 1.958 u[IU]/mL (ref 0.358–3.740)

## 2019-06-09 ENCOUNTER — Encounter (HOSPITAL_BASED_OUTPATIENT_CLINIC_OR_DEPARTMENT_OTHER): Payer: Self-pay | Admitting: Endocrinology

## 2019-09-10 ENCOUNTER — Other Ambulatory Visit: Payer: Self-pay

## 2019-09-18 ENCOUNTER — Ambulatory Visit: Payer: BC Managed Care – PPO | Attending: Internal Medicine

## 2019-09-18 ENCOUNTER — Other Ambulatory Visit: Payer: Self-pay

## 2019-09-18 DIAGNOSIS — Z23 Encounter for immunization: Secondary | ICD-10-CM | POA: Diagnosis not present

## 2019-10-09 ENCOUNTER — Other Ambulatory Visit: Payer: Self-pay

## 2019-10-09 ENCOUNTER — Ambulatory Visit: Payer: BC Managed Care – PPO | Attending: Internal Medicine

## 2019-10-09 DIAGNOSIS — Z23 Encounter for immunization: Secondary | ICD-10-CM | POA: Insufficient documentation

## 2020-05-27 ENCOUNTER — Ambulatory Visit (HOSPITAL_BASED_OUTPATIENT_CLINIC_OR_DEPARTMENT_OTHER): Payer: Self-pay | Admitting: Endocrinology

## 2020-05-29 ENCOUNTER — Ambulatory Visit
Admission: RE | Admit: 2020-05-29 | Discharge: 2020-05-29 | Disposition: A | Discharge status: Home / Self Care | Payer: 59 | Attending: Endocrinology | Admitting: Endocrinology

## 2020-05-29 ENCOUNTER — Encounter (HOSPITAL_BASED_OUTPATIENT_CLINIC_OR_DEPARTMENT_OTHER): Payer: Self-pay | Admitting: Endocrinology

## 2020-05-29 ENCOUNTER — Other Ambulatory Visit: Payer: Self-pay

## 2020-05-29 ENCOUNTER — Ambulatory Visit: Payer: 59 | Attending: Endocrinology | Admitting: Endocrinology

## 2020-05-29 VITALS — BP 152/80 | HR 79 | Temp 97.2°F | Wt 146.0 lb

## 2020-05-29 DIAGNOSIS — Z8639 Personal history of other endocrine, nutritional and metabolic disease: Secondary | ICD-10-CM | POA: Insufficient documentation

## 2020-05-29 DIAGNOSIS — R232 Flushing: Secondary | ICD-10-CM

## 2020-05-29 DIAGNOSIS — E049 Nontoxic goiter, unspecified: Secondary | ICD-10-CM | POA: Diagnosis present

## 2020-05-29 DIAGNOSIS — Z90710 Acquired absence of both cervix and uterus: Secondary | ICD-10-CM | POA: Insufficient documentation

## 2020-05-29 NOTE — Progress Notes (Signed)
Safe at home.-AA

## 2020-05-29 NOTE — Progress Notes (Signed)
Presents for annual f/u of thyroid disorder:  HPI:  9/13 scalp hair loss and tired legs: TSH sent and found to be0.17  10/13 ultrasound thyroid heterogeneous hypervascular; no nodules; right and left lobes both 4 cm; thyroid scan non-focal  Methimazole 11/13-2/14 without improvement in symptoms  2/14 TSI 87; TPO 13; 50 g irregular goiter on exam  11/17 anti-TG negative    Also s/p transphenoidal surgery for Cushing's in [place] 1980, without signs of recurrent Cushings on exam or labs and in 2012, 2014had normal pituitary function: normal postmenopausal FSH, LH, IGF-1 and free thyroxine.     I have reviewed the patient's medical, social, family history and meds in detail and updated the computerized patient record.    ROS :occasional hot flashes; started in her 31's but she is not sure exactly when; she is s/p hysterectomy in her 30's but ovaries were not removed; no fatigue; has problems with falling asleep and does not stay asleep easily- wakes up a lot.  All other ROS reviewed and confirmed as negative.      PHYSICAL EXAMINATION.  BP 152/80    Pulse 79    Temp 97.2 F (36.2 C)    Wt 66.2 kg (146 lb)    SpO2 100%    BMI 27.59 kg/m   Pain Score: Data Unavailable  Body mass index is 27.59 kg/m.  No acute distress. Well developed, well-nourished.   Eyes: Full extraocular movements. No proptosis, lid lag, or stare.   Neck:  40 g firm slightly irregular but symmetric goiter      No cervical lymphadenopathy appreciated.   Cardiovascular: Regular rate and rhythm, normal S1 and S2. No murmur/gallop/rub heard.  Respiratory: Clear to auscultation and percussion. Negative for respiratory distress/rales.   Musculoskeletal: Without clubbing, cyanosis or edema.   Skin: Cool and dry to touch without obvious lesions.   Neurological: Intact with no tremor of the outstretched hands.  Psychiatric: Normal affect and responsiveness to questions.              THYROID FLOWSHEET Latest Ref Rng & Units 05/29/2019   Thyroid Screen  TSH 0.358 - 3.740 uIU/mL 1.958   Thyroid Stim Hormone 0.358 - 3.740 uIU/mL    Free Thyroxine 0.76 - 1.46 ng/dL    Anti Microsomal Antibody (TPO) 0 - 34 IU/mL    Anti-thyroglobulin AB 0.0 - 0.9 IU/mL    TSI 0 - 139 %      (Z86.39) History of hyperthyroidism  (primary encounter diagnosis)  Comment: appears euthyroid currently  Plan: THYROID SCREEN TSH REFLEX FT4  Also sent the following as no recent PCP f/u   LIPID PANEL,         HEMOGLOBIN A1C          (E04.9) Goiter  Comment: unchanged on exam, no nodules  Plan: continue to monitor with exam    (R23.2) Hot flashes  Comment: now minimal. She did not have premature menopause, so no need to consider early BMD  Plan: screening BMD at age 4 per guidelines    F/u 1 year

## 2020-05-30 LAB — HEMOGLOBIN A1C
ESTIMATED AVERAGE GLUCOSE: 117 mg/dL (ref 74–160)
HEMOGLOBIN A1C: 5.7 % — ABNORMAL HIGH (ref 4.0–5.6)

## 2020-06-01 ENCOUNTER — Encounter (HOSPITAL_BASED_OUTPATIENT_CLINIC_OR_DEPARTMENT_OTHER): Payer: Self-pay | Admitting: Endocrinology

## 2020-06-02 LAB — LIPID PANEL
Cholesterol: 198 mg/dL (ref 0–239)
HIGH DENSITY LIPOPROTEIN: 66 mg/dL (ref 40–?)
LOW DENSITY LIPOPROTEIN DIRECT: 102 mg/dL (ref 0–189)
TRIGLYCERIDES: 95 mg/dL (ref 0–150)

## 2020-06-02 LAB — THYROID SCREEN TSH REFLEX FT4: THYROID SCREEN TSH REFLEX FT4: 2.18 u[IU]/mL (ref 0.358–3.740)

## 2020-11-14 ENCOUNTER — Telehealth (HOSPITAL_BASED_OUTPATIENT_CLINIC_OR_DEPARTMENT_OTHER): Payer: Self-pay

## 2020-11-14 NOTE — Telephone Encounter (Signed)
LVM for the patient l to call back to reschedule their appointment.    Marlou Starks, 11/14/2020

## 2020-11-24 ENCOUNTER — Encounter (HOSPITAL_BASED_OUTPATIENT_CLINIC_OR_DEPARTMENT_OTHER): Payer: Self-pay | Admitting: Internal Medicine

## 2021-01-19 ENCOUNTER — Encounter (HOSPITAL_BASED_OUTPATIENT_CLINIC_OR_DEPARTMENT_OTHER): Payer: Self-pay | Admitting: Internal Medicine

## 2021-01-26 ENCOUNTER — Encounter (HOSPITAL_BASED_OUTPATIENT_CLINIC_OR_DEPARTMENT_OTHER): Payer: Self-pay

## 2021-01-26 ENCOUNTER — Emergency Department
Admission: AD | Admit: 2021-01-26 | Discharge: 2021-01-26 | Disposition: A | Discharge status: Home / Self Care | Payer: Exclusive Provider Organization | Attending: Student in an Organized Health Care Education/Training Program | Admitting: Student in an Organized Health Care Education/Training Program

## 2021-01-26 ENCOUNTER — Other Ambulatory Visit: Payer: Self-pay

## 2021-01-26 DIAGNOSIS — R509 Fever, unspecified: Secondary | ICD-10-CM | POA: Diagnosis present

## 2021-01-26 DIAGNOSIS — B349 Viral infection, unspecified: Secondary | ICD-10-CM | POA: Diagnosis not present

## 2021-01-26 DIAGNOSIS — R519 Headache, unspecified: Secondary | ICD-10-CM | POA: Diagnosis present

## 2021-01-26 LAB — RESPIRATORY PANEL BASIC INPAT
INFLUENZA A: NEGATIVE
INFLUENZA B: NEGATIVE
RESPIRATORY SYNCYTIAL VIRUS: NEGATIVE
SARS-COV-2: POSITIVE

## 2021-01-26 MED ORDER — KETOROLAC TROMETHAMINE 30 MG/ML INJ
30.00 mg | Freq: Once | Status: AC
Start: 2021-01-26 — End: 2021-01-26
  Administered 2021-01-26: 30 mg via INTRAMUSCULAR
  Filled 2021-01-26: qty 1

## 2021-01-26 NOTE — ED Provider Notes (Signed)
The patient was seen primarily by me. ED nursing record was reviewed. Select prior records as available electronically through the Epic record were reviewed.        ED RN triage:   Triage Documentation     Kayla Janus, RN 01/26/2021 16:28             "I have a lot of pain all over my body. Particular in the back. I have fever and headache." Onset  Saturday. Tylenol @ 1:30pm             HPI:    Kayla Garza is a 65 year old female patient who has a past medical history of Allergic conjunctivitis (05/25/2012), Breast lump, Helicobacter pylori (H. 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    She has no past medical history of Breast cancer ([place]).     65 year old female with a history of H. pylori presenting with myalgias, fever, and cough.  The patient has had 3 to 4 days of subjective fevers at home, and over the past 2 days has developed myalgias.  The patient describes these as aching muscle pains across her thighs, upper back, and shoulders as well as her upper arms.  She denies any new weakness or numbness in her lower extremities, saddle anesthesia, or urinary retention or incontinence.  She also has a dull frontal headache.  No neck stiffness.  No recent sick contacts.  She has also developed a dry cough over this time, which is nonproductive.  No chest pain or shortness of breath.    SocHx:  Denies etoh, recreational drugs    ROS: Pertinent positives were reviewed as per the HPI above. All other systems were reviewed and are negative.  Kayla Garza  Language of care: [place])  MRN: [id number]  PCP: Kayla Pitts, APRN  Mode of arrival to ED: Self.  Chief complaint: Fever    Past Medical History/Problem list:  Past Medical History:  05/25/2012: Allergic conjunctivitis  No date: Breast lump      Comment:  1/00 Bx > fibroadenoma  [id number]: Helicobacter pylori (H. pylori)      Comment:  s/p  treatment for 2 months  05/25/2012: Hyperopia with astigmatism and presbyopia  1999: Other and unspecified ovarian cyst  05/25/2012: Pinguecula of both eyes  Patient Active Problem List:     Overweight (BMI 25.0-29.9)     Vitamin D deficiency     Ovarian cyst     Allergic rhinitis     Renal anomaly     Hyperthyroidism     Pinguecula of both eyes     Allergic conjunctivitis     Hyperopia with astigmatism and presbyopia     History of Cushing disease     Osteoarthritis of both knees     Coccyx pain     Laryngopharyngeal reflux (LPR)     Palpitations     Goiter     S/P hysterectomy     History of hyperthyroidism    Past Surgical History: Past Surgical History:  No date: BREAST BIOPSY      Comment:  negative right  needle Bx  1990: BRST RCNSTJ IMMT/DLYD W/TISS EXPANDER SBSQ XPNSJ      Comment:  s/p removal of excess skin  1980: 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 7/01 Pap of  vag cuff                Negative; ovaries remain  No date: REDUCTION MAMMAPLASTY  Social History:   Social History     Socioeconomic History    Marital status: Married     Spouse name: Not on file    Number of children: 0    Years of education: Not on file    Highest education level: Not on file   Occupational History     Employer: [employer]   Tobacco Use    Smoking status: Never Smoker    Smokeless tobacco: Never Used   Substance and Sexual Activity    Alcohol use: No    Drug use: No    Sexual activity: Yes     Partners: Male   Other Topics Concern    Military Service Not Asked    Blood Transfusions Not Asked    Caffeine Concern Not Asked    Occupational Exposure Not Asked    Hobby Hazards Not Asked    Sleep Concern Not Asked    Stress Concern No    Weight Concern Not Asked    Special Diet Not Asked    Back Care Not Asked    Exercise Yes    Bike Helmet Not Asked    Seat Belt Not Asked     Self-Exams Not Asked   Social History Narrative    Works with [employer], lives with husband, feels safe    From [place]    Arrived [place] 1985    No children        Gordan Payment, MD, 02/27/2018    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 [place] Strain: Not on file  Food Insecurity: Not on file  Transportation Needs: Not on file  Physical Activity: Not on file  Stress: Not on file  Social Connections: Not on file  Intimate Partner Violence: Not on file  Housing Stability: Not on file     Allergies: Review of Patient's Allergies indicates:  No Known Allergies    Immunizations:   Immunization History   Administered Date(s) Administered    Covid-19 Vaccine (Moderna - Full Dose) 05/30/2020    Covid-19 Vaccine ([place]) 09/18/2019, 10/09/2019    Influenza Virus Quad Presv Free Vacc 6 Mo and Older, IM 05/11/2018    Influenza Virus Quad W/Presv Vacc 6 Mo and Older, IM 05/31/2013, 03/27/2014, 05/07/2015    Td 06/21/1997, 10/30/2007, 05/11/2018          Medications:  Prior to Admission Medications   Prescriptions Last Dose Informant Patient Reported? Taking?   Cholecalciferol (VITAMIN D3) 25 MCG (1000 UT) TABS Tablet   No No   Sig: TAKE 1 TABLET BY MOUTH DAILY      Facility-Administered Medications: None     Physical Exam (ED Bed HAL 09/HAL 09-A):   Patient Vitals for the past 999 hrs:   BP Temp Pulse Resp SpO2   01/26/21 1626 155/90 99.4 F 78 18 98 %     General: Well-appearing, no acute distress  Head: Normocephalic, atraumatic  Eyes: PERRLA, EOMI  ENT: Moist mucous membranes  Neck: Supple, trachea midline  Lungs: No respiratory distress, lungs CTAB  CV: RRR, physiologic S1/S2  Back: No midline tenderness, deformity, or stepoff  Abdomen: Soft, non-distended  Extremities: Warm and well perfused  MSK: No gross deformity  Neuro: Alert and oriented, no gross focal deficit, 5/5 strength with flexion and extension about the bilateral knees and ankles  Psych:  Appropriate mood and affect        Medications Given in the ED:    Medications   ketorolac (TORADOL) injection 30 mg (30 mg Intramuscular Given 01/26/21 1935)    Radiology Results:  See ED COURSE   Lab Results:     Labs Reviewed   RESPIRATORY PANEL BASIC INPAT    Narrative:     Is this the patient's first Covid test?->No  Is the patient employed in healthcare?->Unknown  Is the patient symptomatic as defined by the CDC?->Yes  Is the patient hospitalized?->Yes  Is this an ICU patient?->No  Is the patient a resident in a congregate care setting?  (nursing home, residential care, psych facili  ties,group home, homeless shelter, foster care.  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 ECG, visual acuity):  See ED COURSE     ED Course and Medical Decision-making:  65 year old female presenting with fever, cough, myalgias.  With regards to the patient's back pain, no red flag symptoms for a cauda equina or other compressive spinal cord process.  Given the constellation of the patient's symptoms, felt that spinal epidural abscess is unlikely.  No neurologic symptoms, meningismus to suggest meningitis.  Respiratory status is reassuring in the emergency department.  Overall, the most likely explanation for the patient's symptoms are a viral syndrome.  COVID and influenza swabs are pending at the time of discharge.  The patient was treated symptomatically with ketorolac with improvement.  Patient was recommended continued symptomatic management with Tylenol and ibuprofen at home.  Return precautions were discussed for shortness of breath, worsening symptoms, or any other new, or worrying symptoms.  Patient verbalized understanding prior to discharge.       Patient/family educated on differential/diagnosis(es); she states  understanding and agreement with plan of care.      Disposition: Discharge    Condition on Discharge: Improved and Stable    Diagnosis/Diagnoses:  Viral syndrome    Discharge Prescriptions:      Medication List      ASK your doctor about these medications    Vitamin D3 25 MCG (1000 UT) Tabs Tablet  TAKE 1 TABLET BY MOUTH DAILY              Vanetta Shawl, MD  This Emergency Department patient encounter note was created using voice-recognition software and in real time during the ED visit.

## 2021-01-26 NOTE — ED Triage Note (Signed)
"  I have a lot of pain all over my body. Particular in the back. I have fever and headache." Onset  Saturday. Tylenol @ 1:30pm

## 2021-01-26 NOTE — Narrator Note (Signed)
Patient Disposition  Patient education for diagnosis, medications, activity, diet and follow-up.  Patient left ED 8:02 PM.  Patient rep received written instructions.    Interpreter to provide instructions: No    Patient belongings with patient: YES    Have all existing LDAs been addressed? N/A    Have all IV infusions been stopped? N/A    Destination: Discharged to home with instructions for contd care and f/u with PCP

## 2021-01-26 NOTE — Narrator Note (Signed)
Pt medicated and COVID test completed; pt awaiting contd POC

## 2021-01-26 NOTE — Discharge Instructions (Signed)
You were evaluated in the emergency department for muscle aches, fever, and cough.  Your evaluation included a medical history, physical exam, and viral testing.  These viral swabs are pending at the time of discharge.  You likely have a viral illness.    You should continue to remain hydrated at home.  You can take Tylenol or ibuprofen for symptomatic relief.    You should follow-up with your regular doctor in 2 to 3 days.    Please return to the emergency department if you develop difficulty breathing, chest pain, altered mental status, severe headache, or any other new, worsening, or worsening symptoms.

## 2021-01-27 ENCOUNTER — Encounter (HOSPITAL_BASED_OUTPATIENT_CLINIC_OR_DEPARTMENT_OTHER): Payer: Self-pay

## 2021-01-27 ENCOUNTER — Ambulatory Visit: Payer: Exclusive Provider Organization

## 2021-01-27 ENCOUNTER — Telehealth (HOSPITAL_BASED_OUTPATIENT_CLINIC_OR_DEPARTMENT_OTHER): Payer: Self-pay | Admitting: Registered Nurse

## 2021-01-27 DIAGNOSIS — U071 COVID-19: Secondary | ICD-10-CM | POA: Insufficient documentation

## 2021-01-27 MED ORDER — NIRMATRELVIR & RITONAVIR 20 X 150 MG & 10 X 100MG PO TBPK
ORAL_TABLET | ORAL | 0 refills | Status: AC
Start: 2021-01-27 — End: 2021-02-01

## 2021-01-27 NOTE — Progress Notes (Signed)
[facility] Televisit    Chief Complaint:  FU covid-19    HPI:  This is a 65 year old female who has a televisit today.  The issues discussed are the following:    #covid19  Onset of symptoms: 8/6  Current day of symptoms: 3  Test date: 8/8  Vaccine status: 2 dose mrna vaccine and 1 booster     Risk factors for severe disease: age, overweight, race     HPI: Sx started w feeling feverish, body aches and pain in back.  Temps going up to 99.9.  Also having other body aches.  Having cough, scartchy throat, no runny nose.  Having headache.  Had vomiting yesterday, no diarrhea.  No chest pain or sob.   Was seen in ER yesterday for sx.  O2 sat was 98% and lungs clear per note.    Was taking tylenol, then switched to ibuprofen.     Social Hx:  Lives with husband.  He is ok.  Smoking hx: nonsmoker    I reviewed the patient's past medical history, medications, and allergies.    Patient Active Problem List:     Overweight (BMI 25.0-29.9)     Vitamin D deficiency     Ovarian cyst     Allergic rhinitis     Renal anomaly     Hyperthyroidism     Pinguecula of both eyes     Allergic conjunctivitis     Hyperopia with astigmatism and presbyopia     History of Cushing disease     Osteoarthritis of both knees     Coccyx pain     Laryngopharyngeal reflux (LPR)     Palpitations     Goiter     S/P hysterectomy     History of hyperthyroidism      Exam limited due to televisit.  Appears comfortable on video, conversing without difficulty, no apparent respiratory distress.    Assessment and Plan:  Kayla Garza is a 65 year old female who is being followed for covid19.  The issues addressed are the following:  1. COVID-19 virus infection  At higher risk for severe illness, is open to antiviral, will rx paxlovid.  -cont supportive care w proper rest, fluids, tylenol/ibuprofen  -home isolation at least through 8/11, if no fevers and getting better after that can leave house and be around other people w strict masking for 5 more  days  -if sx worsen, esp if has cp/sob, should call us or go to ER if severe    Proper use and possible side effects were discussed of any prescribed medications.  Patient was advised to call/RTC with worsening symptoms or concerns.    Follow-up: prn    COUNSELING AND COORDINATION OF CARE FOR COVID TREATMENT COURSE     The following options are available, in order of recommendation:  Paxlovid (PO, within 5 days of symptom start)  Remdesivir (IV, 3 day course, within 7 days of symptom start)  Bebtelovimab (IV, within 7 days of symptom start)  Molnupriavir (PO, within 5 days of symptom start)       I have discussed the following with the patient:  All available Covid therapies that may be offered are investigational medicines authorized by the FDA. Yes  These medicines may benefit some people with COVID from progressing to severe disease Yes  Patients can choose whether to receive these medicines. Yes     Details about the medication and side effects are:  Paxlovid- Oral medication has many drug drug interactions (these should be reviewed in detail) and needs to be renally adjusted for GFR <60. Side effects include allergic reaction and  occasional nausea, diarrhea and metallic taste. 5-10% may experience rebound of symptoms D10-16. Patient would need to restart isolation if occurs.      Remdesivir- This is an injection that needs to be given on 3 consecutive days. Side effects include: allergic reaction, site reaction, occasional nausea, reversible liver enzyme elevation     Bebtelovimab- Is an injection that takes 30 seconds, however you then need to be observed for 1 hour afterwards. Side effects include site reaction, itching and rash, all <1%      Molnupriavir- Oral medication cannot be used by breastfeeding mothers or those who are pregnant or can become pregnant. Women should use contraception for at least 4 days after course and men for 3 months after course. Side effect also includes allergic reactions.      We do not know all of the possible side effects as these medicines are still being studied. Yes     If referred for infusion you may be offered a different treatment based on medication supply. They will talk to you about the medication change if this occurs.      If the patient is pregnant or breastfeeding we recommend paxlovid or remdesivir: There is limited experience giving these medicines to pregnant people. However, pregnant people are known to be at greater risk for severe disease with COVID. N/A     I have sent the patient the FDA EUA Fact Sheet by MyChart or given them the link: Yes     Paxlovid: HotterNames.de  Remdesivir: http://medina.org/.pdf  Molnupiravir: RunningShows.fr  Bebtelomab: digjar.com     The patient is interested in [facility] therapy. If not interested at this time given the number to contact the CTC at a later time [phone].      If referring to the [facility], (preferred for paxlovid and molunipiravir) :   The med will default to "Transcribe for Prior Auth". This the alerts [facility] (7 days a week) to outreach to the patient to counsel the patient and arrange for medication pick up or delivery.      If referring to local pharmacy:  Confirm supply on therapeutics locator [email or web address]   Send RX to appropriate pharmacy via escribe.   For Paxlovid, call pharmacy to speak with pharmacist (they will confirm the patients GFR and that you checked med med interactions).      If referring to [facility] (Remdesivir, bebtelovimab ... infusing M-Sat):  Sent [place] internal referral to [employer].

## 2021-01-27 NOTE — Telephone Encounter (Signed)
COVID RESULT CALL AND ASSESSMENT:    Called patient to discuss COVID-19 result.    Result: POSITIVE.    Description of symptoms: cough, back pain    First day of symptoms: 8/6    Pertinent history: BMI > 25 or above 85th percentile for age/gender and Race or ethnicity associated with greater rates of severe COVID: Black/African American, Hispanic/Latinx, American [place], [place] Native: [place]    Counseling and care:    1. Home care advice:    a. Self isolation:   i. Stay home for a minimum of 5 days and until afebrile and symptoms improving, and then strict masking in public through day 10 (no restaurants or air travel): Yes  ii. Minimum last day of isolation: 8/11 (Day 5)  b. Contacts:  i. All close contacts (household members, people with whom you spent time up to 2 days before developed symptoms or tested) should be tested:   Yes  ii. All close contacts who are not "up to date" on vaccination should quarantine for a minimum of 5 days with masking for an additional 5 days and all vaccinated close contacts should wear a mask around others for 10 days: Yes    3. Treatment: If has a condition predisposing to severe disease: would you be interested in being considered for treatment with a medication or infusion to prevent severe disease? Yes -- use [facility]    4.   Offered letter for work or school and sent via [employer] or routed to [employer]: No    5.   Advised patient that if she develops new symptoms, she should call her clinic at [phone], or go to ED in an emergency: Yes    Disposition: Televisit scheduled

## 2021-01-28 ENCOUNTER — Other Ambulatory Visit (HOSPITAL_BASED_OUTPATIENT_CLINIC_OR_DEPARTMENT_OTHER): Payer: Self-pay | Admitting: Internal Medicine

## 2021-02-12 ENCOUNTER — Ambulatory Visit: Payer: Exclusive Provider Organization | Attending: Family Medicine | Admitting: Family Medicine

## 2021-02-12 ENCOUNTER — Encounter (HOSPITAL_BASED_OUTPATIENT_CLINIC_OR_DEPARTMENT_OTHER): Payer: Self-pay | Admitting: Family Medicine

## 2021-02-12 ENCOUNTER — Other Ambulatory Visit: Payer: Self-pay

## 2021-02-12 VITALS — BP 152/89 | HR 78 | Temp 97.6°F | Ht 61.0 in | Wt 144.0 lb

## 2021-02-12 DIAGNOSIS — M8580 Other specified disorders of bone density and structure, unspecified site: Secondary | ICD-10-CM | POA: Insufficient documentation

## 2021-02-12 DIAGNOSIS — Z23 Encounter for immunization: Secondary | ICD-10-CM | POA: Insufficient documentation

## 2021-02-12 DIAGNOSIS — Z1211 Encounter for screening for malignant neoplasm of colon: Secondary | ICD-10-CM | POA: Diagnosis present

## 2021-02-12 DIAGNOSIS — R739 Hyperglycemia, unspecified: Secondary | ICD-10-CM | POA: Diagnosis present

## 2021-02-12 DIAGNOSIS — Z0000 Encounter for general adult medical examination without abnormal findings: Secondary | ICD-10-CM | POA: Insufficient documentation

## 2021-02-12 DIAGNOSIS — Z90710 Acquired absence of both cervix and uterus: Secondary | ICD-10-CM | POA: Diagnosis present

## 2021-02-12 DIAGNOSIS — E559 Vitamin D deficiency, unspecified: Secondary | ICD-10-CM | POA: Insufficient documentation

## 2021-02-12 DIAGNOSIS — K5900 Constipation, unspecified: Secondary | ICD-10-CM | POA: Diagnosis present

## 2021-02-12 DIAGNOSIS — H524 Presbyopia: Secondary | ICD-10-CM | POA: Diagnosis present

## 2021-02-12 DIAGNOSIS — H5200 Hypermetropia, unspecified eye: Secondary | ICD-10-CM | POA: Diagnosis present

## 2021-02-12 DIAGNOSIS — H52209 Unspecified astigmatism, unspecified eye: Secondary | ICD-10-CM | POA: Insufficient documentation

## 2021-02-12 DIAGNOSIS — Z1321 Encounter for screening for nutritional disorder: Secondary | ICD-10-CM | POA: Insufficient documentation

## 2021-02-12 LAB — VITAMIN D,25 HYDROXY: VITAMIN D,25 HYDROXY: 31 ng/mL (ref 30.0–100.0)

## 2021-02-12 LAB — HEMOGLOBIN A1C
ESTIMATED AVERAGE GLUCOSE: 120 mg/dL (ref 74–160)
HEMOGLOBIN A1C: 5.8 % — ABNORMAL HIGH (ref 4.0–5.6)

## 2021-02-12 LAB — CALCIUM: CALCIUM: 9.8 mg/dl (ref 8.5–10.1)

## 2021-02-12 MED ORDER — POLYETHYLENE GLYCOL 3350 17 GM/SCOOP PO POWD
17.0000 g | Freq: Every day | ORAL | 2 refills | Status: DC
Start: 2021-02-12 — End: 2022-03-22

## 2021-02-12 NOTE — Patient Instructions (Signed)
*Accepting new patients, including [facility] patients:  [facility]                      [address], [place]                  [phone]  [facility]                [address], [place]                  [phone]  [facility]       [address], [place]                       [phone] (kids)  [facility]                      [address] [place]                  [phone],   Dr. Diaa Mahaba                 [address] [place]                  [phone]  Dr. M Liu                              [address] [place]                 [phone]  [facility]                       [address], [place]             [phone]  Dr Paul Dollard                   [address], [place]             [phone]  [facility] [address], [place]              [phone]  [facility]              [address], [place]               [phone]  [facility]    [address], [place]                [phone]  [facility]  [address], [place]          [phone]  Dr Robert Baskies             [address], [place]      [phone]  [facility]                        [address], [place]       [phone]  Dr Wanda Irzykowski         [address], [place]             [phone]     *[facility]  [facility]    [address], [place]           [phone]  [facility]     [address], [place]         [phone]  [facility]     [address], [place]                 [phone]    *Other (Some accepting [facility])  [facility]   [address], [place]                    [phone]  Leslie Will                           [facility], [place]    [phone] (kids)  [facility]             [facility] [place]          [phone]  Richard Allard                    [address],  [place]               [phone]  Karen Bright                      [address] [place]    [phone]  [facility] [address], [place]       [phone]  Dr. Susie Kalinian              [address], [place]         [phone]  [facility]               [address], [place]          [phone]  Ronald Smith                    [address], [place]          [phone]  George McEachern          [address], [place]          [phone]  Daniel Roseman              [address], [place]        [phone]  Shibley Malouf                  [address], [place]             [phone]  [facility]                      [address], [place]                [phone]    *Dental Schools  [facility]     [address]                        [phone]  [facility]     [address]    [phone]  [employer]  [address]   [phone]

## 2021-02-12 NOTE — Progress Notes (Signed)
SUBJECTIVE  Kayla Garza is a 65 year old female with:  Patient Active Problem List:     Overweight (BMI 25.0-29.9)     Vitamin D deficiency     Ovarian cyst     Allergic rhinitis     Renal anomaly     Hyperthyroidism     Pinguecula of both eyes     Allergic conjunctivitis     Hyperopia with astigmatism and presbyopia     History of Cushing disease     Osteoarthritis of both knees     Coccyx pain     Laryngopharyngeal reflux (LPR)     Palpitations     Goiter     S/P hysterectomy     History of hyperthyroidism      Language: [place]    [name] Da Domenica Siems main goal for today's visit is for a CPE    #Gynecology  No uterus  Due for mammogram    # Mental Health  At times has anxiety, declines meds or other intervention   No depression    # Diet  Eats a lot of greens, beans/legumes  No juice or soda  A lot of water  Cut down on sweets, cookies, cakes  Lower carbs as well    # Exercise  Used to exercise, but has not done anything in 5 years since suffering from Psychiatry      Was told had osteopenia in [place]    Review of Systems -   See HPI for detailed ROS:  Gen: No fevers, chills, night sweats, weight loss  Skin: No skin/hair/nail changes, rashes/sores, lumps/bumps/moles  HEENT: No headaches, vision/hearing problems, rhinorrhea, odyno/dysphagia  Lung: No cough/sneeze/wheeze, shortness of breath  CV: No chest pain, paroxysmal nocturnal dyspnea, orthopnea, dyspnea on exertion, syncope  GI: No abdominal pain, nausea, vomiting, diarrhea, constipation, melena, hematochezia, bowel incontinence  GU: No polyuria, nocturia, dysuria, hematuria, increased frequency/urgency/hesitancy, bladder incontinence  MS/neuro: No numbness, tingling, weakness, joint pain, edema  Psych: No depression/anxiety/memory problems  Breast/GYN: No breast tenderness, vaginal discharge/odor/itching, change in menses pattern/pain/quality/quantity    Health Maintenance  Review of her age appropriate health maintenance list  reveals:  ZOSTER VACCINE(1 of 2) Never done  MAMMOGRAPHY due on 01/20/2020  COVID-19 Vaccine(4 - Booster for [employer] series) due on 09/28/2020  COLONOSCOPY due on 11/16/2020  HEALTH CARE PROXY due on 02/08/2021  INFLUENZA VACCINE(1) due on 02/19/2021  AWQ Questionnaire due on 02/12/2022  PNEUMOCOCCAL VACCINE SERIES (65+)(1 - PCV) due on 03/30/2023  LIPID SCREENING due on 05/29/2025  TETANUS VACCINE(4 - Td or Tdap) due on 05/11/2028  PHYSICAL EXAM Completed  HIV SCREENING Addressed  HEP C SCREEN Addressed  PNEUMOCOCCAL VACCINE SERIES (< 65) Aged Out  PAP SMEAR Discontinued  HPV SCREENING Discontinued.   Immunization History   Administered Date(s) Administered    Covid-19 Vaccine (Moderna - Full Dose) 05/30/2020    Covid-19 Vaccine [employer] - Purple Cap) 09/18/2019, 10/09/2019    Influenza Virus Quad Presv Free Vacc 6 Mo and Older, IM 05/11/2018    Influenza Virus Quad W/Presv Vacc 6 Mo and Older, IM 05/31/2013, 03/27/2014, 05/07/2015    Td 06/21/1997, 10/30/2007, 05/11/2018       Dental:has not been to the dentist in some time, does brush her  [id number], needs referral      We discussed these preventive services today and she would like to pursue the services, immunizations and screenings as listed in the A&P:    PMH:  Past Medical  History:  05/25/2012: Allergic conjunctivitis  No date: Breast lump      Comment:  1/00 Bx > fibroadenoma  [id number]: Helicobacter pylori (H. pylori)      Comment:  s/p treatment for 2 months  05/25/2012: Hyperopia with astigmatism and presbyopia  1999: Other and unspecified ovarian cyst  05/25/2012: Pinguecula of both eyes    PSH:  Past Surgical History:  No date: BREAST BIOPSY      Comment:  negative right  needle Bx  1990: BRST RCNSTJ IMMT/DLYD W/TISS EXPANDER SBSQ XPNSJ      Comment:  s/p removal of excess skin  1980: CRANIEC TREPHINE BONE FLP BRAIN TUMOR SUPRTENTOR      Comment:  craniotomy age 76, tumor pituitary  1997: HYSTERECTOMY      Comment:  surgery for uterine fibroids. 7/01  Pap of  vag cuff                Negative; ovaries remain  No date: REDUCTION MAMMAPLASTY    Medications: Cholecalciferol (VITAMIN D3) 25 MCG (1000 UT) TABS Tablet, TAKE 1 TABLET BY MOUTH DAILY, Disp: 30 tablet, Rfl: 11    No current facility-administered medications on file prior to visit.      Allergies:  Review of Patient's Allergies indicates:  No Known Allergies    Family History:  Review of patient's family history indicates:  Problem: Osteoporosis      Relation: Mother          Age of Onset: (Not Specified)  Problem: Psychiatric Illness      Relation: Brother          Age of Onset: (Not Specified)          Comment: depression  Problem: Cancer - Breast      Relation: FamHxNeg          Age of Onset: (Not Specified)  Problem: Cancer - Colon      Relation: FamHxNeg          Age of Onset: (Not Specified)  Problem: Diabetes      Relation: FamHxNeg          Age of Onset: (Not Specified)  Problem: Heart      Relation: FamHxNeg          Age of Onset: (Not Specified)      Social History:   Social History     Socioeconomic History    Marital status: Married     Spouse name: Not on file    Number of children: 0    Years of education: Not on file    Highest education level: Not on file   Occupational History     Employer: [employer]   Tobacco Use    Smoking status: Never Smoker    Smokeless tobacco: Never Used   Substance and Sexual Activity    Alcohol use: No    Drug use: No    Sexual activity: Yes     Partners: Male   Other Topics Concern    Military Service Not Asked    Blood Transfusions Not Asked    Caffeine Concern Not Asked    Occupational Exposure Not Asked    Hobby Hazards Not Asked    Sleep Concern Not Asked    Stress Concern No    Weight Concern Not Asked    Special Diet Not Asked    Back Care Not Asked    Exercise Yes    Bike Helmet Not Asked  Seat Belt Not Asked    Self-Exams Not Asked   Social History Narrative    Works with [employer], lives with husband, feels safe     From [place]    Arrived [place] 1985    No children        Gordan Payment, MD, 02/27/2018    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 [place] Strain: Not on [employer] Insecurity: Not on file  Transportation Needs: Not on file  Physical Activity: Not on file  Stress: Not on file  Social Connections: Not on file  Intimate Partner Violence: Not on file  Housing Stability: Not on file    OBJECTIVE    BP 152/89    Pulse 78    Temp 97.6 F (36.4 C) (Temperature probe)    Ht '5\' 1"'$  (1.549 m)    Wt 65.3 kg (144 lb)    SpO2 98%    BMI 27.21 kg/m   Pain Score: Data Unavailable        Most Recent BP Reading(s)  02/12/21 : 152/89  01/26/21 : 155/90  05/29/20 : 152/80    Most Recent Weight Reading(s)  02/12/21 : 65.3 kg (144 lb)  05/29/20 : 66.2 kg (146 lb)  05/22/19 : 69.9 kg (154 lb)        Physical Exam  General Appearance:BMI noted. Well nourished, well developed female in no apparent distress.  Sitting comfortably in exam room.  Alert and oriented to examiner.  Head/Eyes/Ears/Nose/Throat:  Normocephalic/atraumatic.  Pupils are equal and round, extra-ocular movements are intact.  EAC clear, TM with normal landmarks and light reflex.  Mucous membranes are moist. Oropharynx clear, no exudate.  Neck: Supple, no lymphadenopathy. No thyromegaly.  Chest/Thorax: wnl  Lung: Clear to auscultation bilaterally, no crackles/wheezes/rhonchi  Cardiovascular: Regular rate and rhythm, no murmurs/rubs/gallops   Abdomen: Soft, non-tender, non distended, normoactive bowel sounds. No hepatosplenomegaly.  Genitourinary:deferred  Skin: no evident rashes/lesions  Extremities: Warm, well perfused, capillary refill < 2 seconds, 2+radial pulses/dorsalis pedis pulses . No cyanosis/clubbing/edema.   Musculoskeletal: wnl  Neuro: CN II-XII grossly intact. Sensation to light touch grossly normal.  Strength 5/5 in UE/LE.  Reflexes 2+ in biceps/patella.   MSE  Appearance: well groomed, Behavior: appropriate,  Cooperation: good  Speech: normal rate/tone/prosody/volume  Thought Content: logical, Thought Process: goal directed  Mood: appears happy, Affect: congruent    Perceptions: no Auditory/Visual hallucinations/disturbances  Insight: Good, Cognition: Intact  Knowledge fund: age appropriate  Endings: Denies suicidal/homicidal ideations.    Reliability:Patient is reliable.      ASSESSMENT AND PLAN:  Maicey Da Claire Winebrenner is a 65 year old female here for a CPE    1. Encounter for routine history and physical examination    Discussed importance of healthy diet and exercise  Immunizations reviewed and administered  Screenings per below  Dental hygiene discussed - provided list of dentist  Eye evaluation- provided referral      2. Screen for colon cancer  - POC IMMUNOASSAY FECAL OCCULT BLOOD TEST; Future    3. Encounter for vitamin deficiency screening  - CALCIUM    4. Vitamin D deficiency  - VITAMIN D,25 HYDROXY    5. S/P hysterectomy  No need for pap    6. Elevated blood sugar  - HEMOGLOBIN A1C    7. Hyperopia with astigmatism and presbyopia, unspecified laterality  - REFERRAL TO OPTOMETRY (INT)    8. Osteopenia, unspecified location  Given previous dx os osteopenia, will screen for osteoporosis, will also check Vit D and Ca  - XR DXA BONE DENSITOMETRY; Future    9. Constipation, unspecified constipation type  Advised continued water intake  - polyethylene glycol (GLYCOLAX) 17 GM/SCOOP powder; Take 17 g by mouth in the morning.  Dispense: 255 g; Refill: 2    10. Need for shingles vaccine  - HZV ZOSTER VACC RECOMBINANT ADJUVANTED IM NJX    1. The patient indicates understanding of these issues and agrees with the plan.  2.  The patient is given an After Visit Summary sheet that lists all of their medications with directions, their allergies, orders placed during this encounter, immunization dates, and follow- up instructions.  3. I reviewed the patient's medical information and medical history   4.  I reconciled the  patient's medication list and prepared and supplied needed refills.  5. The patient expressed understanding and no barriers to adherence were identified.  6.  I have reviewed the past medical, family, and social history sections including the medications and allergies listed in the above medical record      Reita Cliche, MD, 02/12/2021

## 2021-02-16 ENCOUNTER — Other Ambulatory Visit: Payer: Self-pay

## 2021-02-16 ENCOUNTER — Ambulatory Visit: Payer: Exclusive Provider Organization | Attending: Family Medicine

## 2021-02-16 DIAGNOSIS — Z1211 Encounter for screening for malignant neoplasm of colon: Secondary | ICD-10-CM

## 2021-02-16 LAB — POC IMMUNOASSAY FECAL OCCULT BLOOD TEST: POC FECAL OCCULT BLOOD TEST (IMMUNOASSAY): NEGATIVE

## 2021-02-16 NOTE — Progress Notes (Signed)
ifob received and processed, the final result was negative.

## 2021-02-17 ENCOUNTER — Encounter (HOSPITAL_BASED_OUTPATIENT_CLINIC_OR_DEPARTMENT_OTHER): Payer: Self-pay | Admitting: Family Medicine

## 2021-04-14 ENCOUNTER — Ambulatory Visit (HOSPITAL_BASED_OUTPATIENT_CLINIC_OR_DEPARTMENT_OTHER)
Admission: RE | Admit: 2021-04-14 | Discharge: 2021-04-14 | Disposition: A | Discharge status: Home / Self Care | Payer: Exclusive Provider Organization | Source: Ambulatory Visit

## 2021-04-14 ENCOUNTER — Other Ambulatory Visit: Payer: Self-pay

## 2021-04-14 ENCOUNTER — Ambulatory Visit
Admission: RE | Admit: 2021-04-14 | Discharge: 2021-04-14 | Disposition: A | Discharge status: Home / Self Care | Payer: Exclusive Provider Organization | Attending: Internal Medicine | Admitting: Internal Medicine

## 2021-04-14 ENCOUNTER — Encounter (HOSPITAL_BASED_OUTPATIENT_CLINIC_OR_DEPARTMENT_OTHER): Payer: Self-pay

## 2021-04-14 DIAGNOSIS — Z1231 Encounter for screening mammogram for malignant neoplasm of breast: Secondary | ICD-10-CM | POA: Diagnosis present

## 2021-04-14 DIAGNOSIS — M85852 Other specified disorders of bone density and structure, left thigh: Secondary | ICD-10-CM

## 2021-04-14 DIAGNOSIS — Z780 Asymptomatic menopausal state: Secondary | ICD-10-CM

## 2021-04-14 DIAGNOSIS — M8580 Other specified disorders of bone density and structure, unspecified site: Secondary | ICD-10-CM

## 2021-05-01 ENCOUNTER — Encounter (HOSPITAL_BASED_OUTPATIENT_CLINIC_OR_DEPARTMENT_OTHER): Payer: Self-pay | Admitting: Family Medicine

## 2021-05-01 ENCOUNTER — Telehealth (HOSPITAL_BASED_OUTPATIENT_CLINIC_OR_DEPARTMENT_OTHER): Payer: Self-pay | Admitting: Family Medicine

## 2021-05-01 DIAGNOSIS — M8580 Other specified disorders of bone density and structure, unspecified site: Secondary | ICD-10-CM

## 2021-05-01 MED ORDER — CALTRATE 600+D PLUS MINERALS 600-800 MG-UNIT PO TABS
2.0000 | ORAL_TABLET | Freq: Every day | ORAL | 11 refills | Status: DC
Start: 2021-05-01 — End: 2022-03-22

## 2021-05-01 NOTE — Telephone Encounter (Signed)
Left message in regards to DEXA scan  DEXA results:  Confirms osteopenia (which she knew about as was diagnosed in [place])  Have sent letter via [employer]

## 2021-05-02 ENCOUNTER — Encounter (HOSPITAL_BASED_OUTPATIENT_CLINIC_OR_DEPARTMENT_OTHER): Payer: Self-pay

## 2021-05-02 NOTE — Telephone Encounter (Signed)
Med on file with Pharmacy:  [facility] confirmed that calcium has active refills remaining. No refill is required at this time.

## 2021-05-08 ENCOUNTER — Encounter (HOSPITAL_BASED_OUTPATIENT_CLINIC_OR_DEPARTMENT_OTHER): Payer: Self-pay | Admitting: Family Medicine

## 2021-05-18 ENCOUNTER — Other Ambulatory Visit: Payer: Self-pay

## 2021-05-18 ENCOUNTER — Ambulatory Visit: Payer: Exclusive Provider Organization | Attending: Ophthalmology | Admitting: Optometrist

## 2021-05-18 DIAGNOSIS — H04123 Dry eye syndrome of bilateral lacrimal glands: Secondary | ICD-10-CM | POA: Diagnosis not present

## 2021-05-18 DIAGNOSIS — H524 Presbyopia: Secondary | ICD-10-CM | POA: Diagnosis not present

## 2021-05-18 DIAGNOSIS — H5203 Hypermetropia, bilateral: Secondary | ICD-10-CM

## 2021-05-18 DIAGNOSIS — H52203 Unspecified astigmatism, bilateral: Secondary | ICD-10-CM | POA: Diagnosis not present

## 2021-05-18 NOTE — Progress Notes (Signed)
I had a pleasure of seeing 65 year old Kayla Garza at the [facility] on 05/18/2021.   The summary of findings of the comprehensive eye exam are following:    Assessment:    1. Hyperopia with Astigmatism and Presbyopia both eyes    2. Dry eye syndrome OU    Plan:    1. Patient does have to wear glasses.  Glasses should be worn full time.    2. The patient has dry eye syndrome.   She is instructed to apply warm compresses to her closed eyelids twice a day, for five minutes at a time.  she can Use over-the-counter preservative free artificial tears such as Refresh, Systane, Theratears, Soothe, or Visine Tears 4 X per day or as needed.       Return to clinic in one year for full exam.

## 2021-06-04 ENCOUNTER — Other Ambulatory Visit: Payer: Self-pay

## 2021-06-04 ENCOUNTER — Ambulatory Visit (HOSPITAL_BASED_OUTPATIENT_CLINIC_OR_DEPARTMENT_OTHER): Payer: Exclusive Provider Organization | Admitting: Endocrinology

## 2021-06-04 ENCOUNTER — Ambulatory Visit
Admission: RE | Admit: 2021-06-04 | Discharge: 2021-06-04 | Disposition: A | Discharge status: Home / Self Care | Payer: Exclusive Provider Organization | Attending: Endocrinology | Admitting: Endocrinology

## 2021-06-04 ENCOUNTER — Encounter (HOSPITAL_BASED_OUTPATIENT_CLINIC_OR_DEPARTMENT_OTHER): Payer: Self-pay | Admitting: Endocrinology

## 2021-06-04 VITALS — BP 150/80 | HR 79 | Temp 98.0°F | Wt 148.0 lb

## 2021-06-04 DIAGNOSIS — D2339 Other benign neoplasm of skin of other parts of face: Secondary | ICD-10-CM | POA: Diagnosis not present

## 2021-06-04 DIAGNOSIS — R002 Palpitations: Secondary | ICD-10-CM

## 2021-06-04 DIAGNOSIS — Z8639 Personal history of other endocrine, nutritional and metabolic disease: Secondary | ICD-10-CM

## 2021-06-04 LAB — FREE THYROXINE: FREE THYROXINE: 1.08 ng/dL (ref 0.93–1.70)

## 2021-06-04 LAB — THYROID SCREEN TSH REFLEX FT4: THYROID SCREEN TSH REFLEX FT4: 4.54 u[IU]/mL — ABNORMAL HIGH (ref 0.270–4.200)

## 2021-06-04 NOTE — Progress Notes (Signed)
Presents for annual f/u ofthyroid disorder and remote hx of pituitary Cushing's  HPI:  1. SC Hyperthyroidism  9/13 scalp hair loss and tired legs: TSH sent and found to be0.17  10/13 ultrasound thyroid heterogeneous hypervascular; no nodules; right and left lobes both 4 cm; thyroid scan non-focal  11/13-2/14 took methimazole without improvement in symptoms  2/14 TSI 87; TPO 13; 50 g irregular goiter on exam  11/17 anti-TG negative    3. Hx of Cushings'   1980 transphenoidal surgery in [place]  2012, 2014 work up showed normal pituitary function--> no residual disease  10/22 BMD: T-1.9 spine, T-2.0 left FN; T- 1.7 right FN, FRAX 9% and 1.3%    I have reviewed the patient's medical, social, family history and meds in detail and updated the computerized patient record.    ROS: no chest pain; get palpitations 2-3x per month, duratiion a few seconds.  No shortness of breath.   No new aches or pains. Has lump on left forehead that is growing larger; she would like it removed.  All other ROS reviewed and confirmed as negative.    PHYSICAL EXAMINATION.  BP 150/80 (Site: LA, Cuff Size: Reg)    Pulse 79    Temp 98 F (36.7 C)    Wt 67.1 kg (148 lb)    SpO2 99%    BMI 27.96 kg/m   Pain Score: Data Unavailable  Body mass index is 27.96 kg/m.  No acute distress. Well developed, well-nourished.   Eyes: Full extraocular movements. No proptosis, lid lag, or stare.   Neck: Negative for goiter. No cervical lymphadenopathy appreciated.   Cardiovascular: Regular rate and rhythm, normal S1 and S2. No murmur/gallop/rub heard.  Respiratory: Clear to auscultation and percussion. Negative for respiratory distress/rales.   Musculoskeletal: Without clubbing, cyanosis or edema.   Skin: Cool and dry to touch. Left temple with 3-4 mm very firm round sq lesion  Neurological: Intact with no tremor of the outstretched hands.  Psychiatric: Normal affect and responsiveness to questions.                HEMOGLOBIN A1C (%)   Date Value    02/12/2021 5.8 (H)   05/29/2020 5.7 (H)   05/11/2018 5.5     VITAMIN D,25 HYDROXY (ng/mL)   Date Value   02/12/2021 31   05/11/2018 27 (L)   09/20/2016 23 (L)       THYROID FLOWSHEET Latest Ref Rng & Units 05/29/2020   Thyroid Screen TSH 0.358 - 3.740 uIU/mL 2.180   Thyroid Stim Hormone 0.358 - 3.740 uIU/mL    Free Thyroxine 0.76 - 1.46 ng/dL    Anti Microsomal Antibody (TPO) 0 - 34 IU/mL    Anti-thyroglobulin AB 0.0 - 0.9 IU/mL    TSI 0 - 139 %        (Z86.39) History of hyperthyroidism  (primary encounter diagnosis)  Comment: clinically euthyroid  Plan: THYROID SCREEN TSH REFLEX FT4            (Z86.39) History of Cushing disease  Comment: resolved; and hormone eval negative on 2 prior occasions  Plan: continue to monitor    (R00.2) Palpitations  Comment: infrequent; no tachycardia or irregular heartbeat today. A prior work up for these showed a negative Holter  Plan: continue to monitor    (D23.39) Dermoid cyst of forehead  Comment: appears benign but pt would like removed  Plan: REFERRAL TO GENERAL SURGERY (INT)

## 2021-06-04 NOTE — Progress Notes (Signed)
Patient feels physically safe at home.

## 2021-06-08 ENCOUNTER — Encounter (HOSPITAL_BASED_OUTPATIENT_CLINIC_OR_DEPARTMENT_OTHER): Payer: Self-pay | Admitting: Endocrinology

## 2021-06-23 ENCOUNTER — Ambulatory Visit (HOSPITAL_BASED_OUTPATIENT_CLINIC_OR_DEPARTMENT_OTHER): Payer: Self-pay | Admitting: Surgery

## 2021-06-30 ENCOUNTER — Encounter (HOSPITAL_BASED_OUTPATIENT_CLINIC_OR_DEPARTMENT_OTHER): Payer: Self-pay | Admitting: Family Medicine

## 2021-06-30 DIAGNOSIS — M8580 Other specified disorders of bone density and structure, unspecified site: Secondary | ICD-10-CM

## 2021-07-02 ENCOUNTER — Ambulatory Visit (HOSPITAL_BASED_OUTPATIENT_CLINIC_OR_DEPARTMENT_OTHER): Payer: Self-pay | Admitting: Surgery

## 2021-07-10 ENCOUNTER — Ambulatory Visit (HOSPITAL_BASED_OUTPATIENT_CLINIC_OR_DEPARTMENT_OTHER): Payer: Self-pay | Admitting: Surgery

## 2021-07-10 ENCOUNTER — Ambulatory Visit: Payer: Exclusive Provider Organization | Attending: Surgery | Admitting: Surgery

## 2021-07-10 ENCOUNTER — Other Ambulatory Visit: Payer: Self-pay

## 2021-07-10 ENCOUNTER — Encounter (HOSPITAL_BASED_OUTPATIENT_CLINIC_OR_DEPARTMENT_OTHER): Payer: Self-pay | Admitting: Surgery

## 2021-07-10 VITALS — BP 173/95 | HR 75

## 2021-07-10 DIAGNOSIS — L728 Other follicular cysts of the skin and subcutaneous tissue: Secondary | ICD-10-CM | POA: Diagnosis present

## 2021-07-10 DIAGNOSIS — L720 Epidermal cyst: Secondary | ICD-10-CM | POA: Insufficient documentation

## 2021-07-10 NOTE — Progress Notes (Signed)
Surgical History and Physical    HPI: The patient is a 66 year old female with a L forehead lump present for about 10 years but recently has started growing and is now sticking out and she is here to consider removal.  No prior infections. No other cysts.    Past Medical History:  05/25/2012: Allergic conjunctivitis  No date: Breast lump      Comment:  1/00 Bx > fibroadenoma  0938: Helicobacter pylori (H. 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 7/01 Pap of  vag cuff                Negative; ovaries remain  No date: REDUCTION MAMMAPLASTY  Review of Patient's Allergies indicates:  No Known Allergies      Social History:  Social History    Tobacco Use      Smoking status: Never      Smokeless tobacco: Never    Alcohol use: No    IVDU:  OCCUPATION:    Family History:  Review of patient's family history indicates:  Problem: Osteoporosis      Relation: Mother          Age of Onset: (Not Specified)  Problem: Psychiatric Illness      Relation: Brother          Age of Onset: (Not Specified)          Comment: depression  Problem: Cancer - Breast      Relation: FamHxNeg          Age of Onset: (Not Specified)  Problem: Cancer - Colon      Relation: FamHxNeg          Age of Onset: (Not Specified)  Problem: Diabetes      Relation: FamHxNeg          Age of Onset: (Not Specified)  Problem: Heart      Relation: FamHxNeg          Age of Onset: (Not Specified)      Medications:  (Not in a hospital admission)    calcium carbonate + vitamin D  (CALTRATE 600+D PLUS) 600-800 MG-UNIT TABS per tablet, Take 2 tablets by mouth in the morning., Disp: 60 tablet, Rfl: 11  Cholecalciferol (VITAMIN D3) 25 MCG (1000 UT) TABS Tablet, TAKE 1 TABLET BY MOUTH DAILY, Disp: 30 tablet, Rfl: 11    No current facility-administered medications on file prior to visit.        Review of Systems:   No bleeding problems, blood clots  No SOB    Physical Exam:  VITALS: BP (!) 173/95    Pulse 75   CONSTUTIONAL: awake, alert, NAD  NEURO: Moving all extremities symmetrically, no focal deficits.   HEENT: sclera non icteric, moist mucus membranes, NCAT  SKIN:  Dry and warm. L forehead 1 cm benign appearing sebaceous cyst.  PSYCH: normal speech and affect  RESP: no respiratory distress    LABS:  Lab Results   Component Value Date    NA 138 05/11/2018    K 4.1 05/11/2018    CL 105 05/11/2018    CO2 28 05/11/2018    BUN 15 05/11/2018    CREAT 0.8 05/11/2018    TBILI 0.2 05/11/2018    AST 19 05/11/2018    ALT 26 05/11/2018    ALKPHOS 125 (H) 05/11/2018     Lab Results   Component Value Date    WBC 5.1 09/20/2016    HGB 12.4 09/20/2016    HCT 38.7 09/20/2016    PLTA 320 09/20/2016       A/P: The patient is a 66 year old female with benign but symptomatic and growing forehead cyst. Appropriate for excision in the office under local.  We discussed the details and the expected recovery. Interpreter used and all questions addressed.    Marthe Patch, MD  Pager #: [phone]

## 2021-08-28 ENCOUNTER — Other Ambulatory Visit: Payer: Self-pay

## 2021-08-28 ENCOUNTER — Encounter (HOSPITAL_BASED_OUTPATIENT_CLINIC_OR_DEPARTMENT_OTHER): Payer: Self-pay | Admitting: Surgery

## 2021-08-28 ENCOUNTER — Ambulatory Visit: Payer: Exclusive Provider Organization | Attending: Surgery | Admitting: Surgery

## 2021-08-28 VITALS — BP 146/76 | HR 81

## 2021-08-28 DIAGNOSIS — L720 Epidermal cyst: Secondary | ICD-10-CM | POA: Insufficient documentation

## 2021-08-28 NOTE — Progress Notes (Signed)
PROCEDURE NOTE    SUBJECTIVE:   Kayla Garza is a 66 year old female who presents for excision of L forehead epidermal inclusion cyst. Informed consent was obtained and documented including option of not performing surgery, technique of surgery, potential for scarring, risk of infection, and allergic reaction to anesthetic.    OBJECTIVE:   Patient appears well.  BP 146/76    Pulse 81    Skin: 8 mm protruding L forehead sebaceous cyst. No infection.      PATIENT/PROCEDURE VERIFICATION DOCUMENTATION    Correct patient: Yes  Correct procedure: Yes  Correct site, mark visible if applicable: Yes  Correct position: Yes  Special equipment/implant(s) present, if applicable: Yes    Time-out completed, documented by provider doing procedure or designated team member:  Marthe Patch, MD    08/28/2021    3:43 PM      ASSESSMENT:   sebaceous cyst, without lesions    PLAN:   After site was prepped in sterile fashion, using Betadine for cleansing and 2.0cc  1% Lidocaine with epinephrine for anesthetic, with sterile technique, elliptical excision in total was performed. (9 mm size wound to remove a 5 mm size cyst)  One inverted inturrupted 4-0 vicryl stitch placed to close the skin.   Exofin dressing placed.    Wound care instructions were provided and the patient was instructed to be alert for any signs of cutaneous infection.  Follow up: the specimen is labeled and sent to pathology for evaluation.

## 2021-08-28 NOTE — Patient Instructions (Signed)
Post-Procedure Care Instructions    Please read the instructions outlined below and refer to this sheet in the next few weeks. These discharge instructions provide you with general information on caring for yourself after you leave the hospital. Your surgeon may also give you specific instructions. While your treatment has been planned according to the most current medical practices available, problems sometimes still happen. If you have any problems or questions after discharge, please call your surgeon at [phone].                       HOME CARE INSTRUCTIONS AFTER YOUR PROCEDURE        You may shower with the plastic dressing on. Remove the plastic dressing in 3 days.  The stitches are buried and will be absorbed by your body. They do not need to be removed.  The wound may be washed in the shower gently with soap and water starting tomorrow. Pat dry. Do not rub.  You  may leave the wound open to air.  Do not take baths, use swimming pools or use hot tubs for two weeks.   Only take over-the-counter or prescription medicines for pain, discomfort, or fever as directed by your caregiver.  You may take Tylenol Extra Strength for pain and also Ibuprofen 600 mg every 6 hours as needed for pain. Take all pain medication with some food.  You may use an ice pack in the first 2 days to help with pain.    Activity: As tolerated     SEEK MEDICAL CARE IF:    There is redness, swelling, or increasing pain in the wound area.    Pus is coming from the wound.    An unexplained oral temperature above 101 F (38.3 C) develops.    You notice a foul smell coming from the wound or dressing.    Call Dr. Mamie Levers at [phone] for problems or questions.

## 2021-08-28 NOTE — Nursing Note (Signed)
Patient underwent excision of cyst of forehad by Dr. Mamie Levers .     Patient was properly identified with two identifier, pre and post vital signs performed.   Consent signed and dated and Time out performed.  VS returned to baseline post procedure.    Post procedure instructions reviewed orally and in writing.  Patient understands and agrees with plan.  Advised patient to call the clinic for any questions or concerns.  Will follow up PRN

## 2021-08-31 ENCOUNTER — Encounter (HOSPITAL_BASED_OUTPATIENT_CLINIC_OR_DEPARTMENT_OTHER): Payer: Self-pay | Admitting: Surgery

## 2021-09-09 LAB — SURGICAL PATH SPECIMEN SOFT TISSUE

## 2022-02-11 DIAGNOSIS — M9902 Segmental and somatic dysfunction of thoracic region: Secondary | ICD-10-CM | POA: Diagnosis not present

## 2022-02-11 DIAGNOSIS — M9903 Segmental and somatic dysfunction of lumbar region: Secondary | ICD-10-CM | POA: Diagnosis not present

## 2022-02-11 DIAGNOSIS — M9905 Segmental and somatic dysfunction of pelvic region: Secondary | ICD-10-CM | POA: Diagnosis not present

## 2022-02-11 DIAGNOSIS — M545 Low back pain, unspecified: Secondary | ICD-10-CM | POA: Diagnosis not present

## 2022-02-16 DIAGNOSIS — M545 Low back pain, unspecified: Secondary | ICD-10-CM | POA: Diagnosis not present

## 2022-02-16 DIAGNOSIS — M9905 Segmental and somatic dysfunction of pelvic region: Secondary | ICD-10-CM | POA: Diagnosis not present

## 2022-02-16 DIAGNOSIS — M9903 Segmental and somatic dysfunction of lumbar region: Secondary | ICD-10-CM | POA: Diagnosis not present

## 2022-02-16 DIAGNOSIS — M9902 Segmental and somatic dysfunction of thoracic region: Secondary | ICD-10-CM | POA: Diagnosis not present

## 2022-02-18 DIAGNOSIS — M545 Low back pain, unspecified: Secondary | ICD-10-CM | POA: Diagnosis not present

## 2022-02-23 DIAGNOSIS — M9903 Segmental and somatic dysfunction of lumbar region: Secondary | ICD-10-CM | POA: Diagnosis not present

## 2022-02-23 DIAGNOSIS — M9902 Segmental and somatic dysfunction of thoracic region: Secondary | ICD-10-CM | POA: Diagnosis not present

## 2022-02-23 DIAGNOSIS — M9905 Segmental and somatic dysfunction of pelvic region: Secondary | ICD-10-CM | POA: Diagnosis not present

## 2022-02-23 DIAGNOSIS — M545 Low back pain, unspecified: Secondary | ICD-10-CM | POA: Diagnosis not present

## 2022-02-25 DIAGNOSIS — M9902 Segmental and somatic dysfunction of thoracic region: Secondary | ICD-10-CM | POA: Diagnosis not present

## 2022-02-25 DIAGNOSIS — M9903 Segmental and somatic dysfunction of lumbar region: Secondary | ICD-10-CM | POA: Diagnosis not present

## 2022-02-25 DIAGNOSIS — M545 Low back pain, unspecified: Secondary | ICD-10-CM | POA: Diagnosis not present

## 2022-02-25 DIAGNOSIS — M9905 Segmental and somatic dysfunction of pelvic region: Secondary | ICD-10-CM | POA: Diagnosis not present

## 2022-03-02 DIAGNOSIS — M545 Low back pain, unspecified: Secondary | ICD-10-CM | POA: Diagnosis not present

## 2022-03-04 DIAGNOSIS — M9902 Segmental and somatic dysfunction of thoracic region: Secondary | ICD-10-CM | POA: Diagnosis not present

## 2022-03-04 DIAGNOSIS — M9903 Segmental and somatic dysfunction of lumbar region: Secondary | ICD-10-CM | POA: Diagnosis not present

## 2022-03-04 DIAGNOSIS — M9905 Segmental and somatic dysfunction of pelvic region: Secondary | ICD-10-CM | POA: Diagnosis not present

## 2022-03-04 DIAGNOSIS — M545 Low back pain, unspecified: Secondary | ICD-10-CM | POA: Diagnosis not present

## 2022-03-09 DIAGNOSIS — M545 Low back pain, unspecified: Secondary | ICD-10-CM | POA: Diagnosis not present

## 2022-03-11 DIAGNOSIS — M545 Low back pain, unspecified: Secondary | ICD-10-CM | POA: Diagnosis not present

## 2022-03-11 DIAGNOSIS — M9903 Segmental and somatic dysfunction of lumbar region: Secondary | ICD-10-CM | POA: Diagnosis not present

## 2022-03-11 DIAGNOSIS — M9905 Segmental and somatic dysfunction of pelvic region: Secondary | ICD-10-CM | POA: Diagnosis not present

## 2022-03-11 DIAGNOSIS — M9902 Segmental and somatic dysfunction of thoracic region: Secondary | ICD-10-CM | POA: Diagnosis not present

## 2022-03-16 DIAGNOSIS — M9902 Segmental and somatic dysfunction of thoracic region: Secondary | ICD-10-CM | POA: Diagnosis not present

## 2022-03-16 DIAGNOSIS — M545 Low back pain, unspecified: Secondary | ICD-10-CM | POA: Diagnosis not present

## 2022-03-16 DIAGNOSIS — M9903 Segmental and somatic dysfunction of lumbar region: Secondary | ICD-10-CM | POA: Diagnosis not present

## 2022-03-16 DIAGNOSIS — M9905 Segmental and somatic dysfunction of pelvic region: Secondary | ICD-10-CM | POA: Diagnosis not present

## 2022-03-22 ENCOUNTER — Ambulatory Visit: Payer: Exclusive Provider Organization | Attending: Internal Medicine | Admitting: Internal Medicine

## 2022-03-22 ENCOUNTER — Encounter (HOSPITAL_BASED_OUTPATIENT_CLINIC_OR_DEPARTMENT_OTHER): Payer: Self-pay | Admitting: Internal Medicine

## 2022-03-22 ENCOUNTER — Telehealth (HOSPITAL_BASED_OUTPATIENT_CLINIC_OR_DEPARTMENT_OTHER): Payer: Self-pay | Admitting: Internal Medicine

## 2022-03-22 ENCOUNTER — Other Ambulatory Visit: Payer: Self-pay

## 2022-03-22 VITALS — BP 159/94 | HR 95 | Temp 97.6°F | Ht 60.51 in | Wt 156.8 lb

## 2022-03-22 DIAGNOSIS — Z7185 Encounter for immunization safety counseling: Secondary | ICD-10-CM | POA: Diagnosis present

## 2022-03-22 DIAGNOSIS — Z129 Encounter for screening for malignant neoplasm, site unspecified: Secondary | ICD-10-CM | POA: Diagnosis present

## 2022-03-22 DIAGNOSIS — R739 Hyperglycemia, unspecified: Secondary | ICD-10-CM | POA: Diagnosis present

## 2022-03-22 DIAGNOSIS — Z780 Asymptomatic menopausal state: Secondary | ICD-10-CM | POA: Diagnosis present

## 2022-03-22 DIAGNOSIS — Z13228 Encounter for screening for other metabolic disorders: Secondary | ICD-10-CM | POA: Diagnosis present

## 2022-03-22 DIAGNOSIS — R030 Elevated blood-pressure reading, without diagnosis of hypertension: Secondary | ICD-10-CM | POA: Diagnosis present

## 2022-03-22 DIAGNOSIS — K5900 Constipation, unspecified: Secondary | ICD-10-CM | POA: Insufficient documentation

## 2022-03-22 DIAGNOSIS — M8580 Other specified disorders of bone density and structure, unspecified site: Secondary | ICD-10-CM | POA: Insufficient documentation

## 2022-03-22 DIAGNOSIS — Z1211 Encounter for screening for malignant neoplasm of colon: Secondary | ICD-10-CM | POA: Diagnosis present

## 2022-03-22 LAB — LIPID PANEL
Cholesterol: 228 mg/dL (ref 0–239)
HIGH DENSITY LIPOPROTEIN: 71 mg/dL (ref 40–60)
LOW DENSITY LIPOPROTEIN DIRECT: 140 mg/dL (ref 0–189)
TRIGLYCERIDES: 93 mg/dL (ref 0–150)

## 2022-03-22 LAB — HEMOGLOBIN A1C
ESTIMATED AVERAGE GLUCOSE: 123 mg/dL (ref 74–160)
HEMOGLOBIN A1C: 5.9 % — ABNORMAL HIGH (ref 4.0–5.6)

## 2022-03-22 MED ORDER — POLYETHYLENE GLYCOL 3350 17 GM/SCOOP PO POWD
17.0000 g | Freq: Every day | ORAL | 1 refills | Status: DC | PRN
Start: 2022-03-22 — End: 2023-06-30

## 2022-03-22 MED ORDER — CALTRATE 600+D PLUS MINERALS 600-800 MG-UNIT PO TABS
2.0000 | ORAL_TABLET | Freq: Every day | ORAL | 11 refills | Status: DC
Start: 2022-03-22 — End: 2023-05-09

## 2022-03-22 NOTE — Progress Notes (Signed)
Kayla Garza is a 66 year old female, [place], I last saw her in 2019. She's been followed by Dr Gwen Her here at the clinic. 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 BS vary between 90-120  She was not aware of the difference between fasting and post-prandial  No polys    HEMOGLOBIN A1C (%)   Date Value   02/12/2021 5.8 (H)   05/29/2020 5.7 (H)   05/11/2018 5.5       #) Immunization reviewed    #) Other issues addressed under A&P     ROS: No fevers or unexplained weight loss. No new headaches. No shortness of breath or chest pain.    OBJECTIVE:  BP (!) 159/94 (Site: LA, Position: Sitting, Cuff Size: Reg)   Pulse 95   Temp 97.6 F (36.4 C) (Temporal)   Ht 5' 0.51" (1.537 m)   Wt 71.1 kg (156 lb 12.8 oz)   SpO2 98%   BMI 30.11 kg/m      Most Recent BP Reading(s)  03/22/22 : (!) 159/94  08/28/21 : 146/76  07/10/21 : (!) 173/95  06/04/21 : 150/80  02/12/21 : 152/89    BP recheck, manual -  135/85    Heart: S1 and S2 normal, no murmurs, clicks, gallops or rubs. Regular rate and rhythm.   Lungs:  clear; no wheezes, rhonchi or rales.    ASSESSMENT & PLAN:  (R73.9) Elevated blood sugar  (primary encounter diagnosis)  Comment: Extensive discussion re: BS and how the body processes them.   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     (Z13.228) Screening for metabolic disorder  Comment: routine  Plan: LIPID PANEL         (K59.00) Constipation, unspecified constipation type  Comment: requests refill for glycolax. Uses sparingly.   High fiber diet + hydration + exercise recommended as mgmt of constipation  Plan: polyethylene glycol (GLYCOLAX) 17 GM/SCOOP         powder       (Z12.9) Cancer screening  (Z12.11) Encounter for screening for malignant neoplasm of colon  Comment: Rationale for screening reviewed  Breast: Due for mammogram, ordered today. Pt to call and schedule  Cervical: not indicated - hysterectomy   Colon: cologuard today  Lung: not indicated, no hx smoking  Plan: STOOL DNA  [place] SCREENING MAMMO BILATERAL DIGITAL WITH DBT &        CAD    (Z71.85) Immunization counseling  Comment: Rationale for immuniz reviewed, agrees to influenza and zoster  Plan: IIV4 VACC PRESERV FREE AGE 51 MONTHS AND  OLDER,         0.5ML, IM, HZV ZOSTER VACC RECOMBINANT         ADJUVANTED IM NJX          The patient was ready to learn and no apparent learning or adherence barriers were identified. I explained the diagnosis and treatment plan, and the patient expressed understanding of the content. I attempted to answer any questions regarding the diagnosis and the proposed treatment.    We discussed the patient's current medications.  We discussed the importance of medication compliance. The patient expressed understanding and no barriers to adherence were identified.    follow-up prn  she has been advised to call or return with any worsening or new problems

## 2022-03-22 NOTE — Telephone Encounter (Signed)
nurse  called the [facility] to complete a benefit analysis for the shingrix Vaccine.    The vaccine is covered under the patient’s [place] medical coverage.    Please choose Private

## 2022-03-25 DIAGNOSIS — M9903 Segmental and somatic dysfunction of lumbar region: Secondary | ICD-10-CM | POA: Diagnosis not present

## 2022-03-25 DIAGNOSIS — M9902 Segmental and somatic dysfunction of thoracic region: Secondary | ICD-10-CM | POA: Diagnosis not present

## 2022-03-25 DIAGNOSIS — M9905 Segmental and somatic dysfunction of pelvic region: Secondary | ICD-10-CM | POA: Diagnosis not present

## 2022-03-25 DIAGNOSIS — M545 Low back pain, unspecified: Secondary | ICD-10-CM | POA: Diagnosis not present

## 2022-03-29 DIAGNOSIS — Z1211 Encounter for screening for malignant neoplasm of colon: Secondary | ICD-10-CM | POA: Diagnosis not present

## 2022-04-01 DIAGNOSIS — M9902 Segmental and somatic dysfunction of thoracic region: Secondary | ICD-10-CM | POA: Diagnosis not present

## 2022-04-01 DIAGNOSIS — M545 Low back pain, unspecified: Secondary | ICD-10-CM | POA: Diagnosis not present

## 2022-04-01 DIAGNOSIS — M9903 Segmental and somatic dysfunction of lumbar region: Secondary | ICD-10-CM | POA: Diagnosis not present

## 2022-04-01 DIAGNOSIS — M9905 Segmental and somatic dysfunction of pelvic region: Secondary | ICD-10-CM | POA: Diagnosis not present

## 2022-04-06 LAB — STOOL DNA: NONINV COLON CA DNA + OCC BLD SCREEN STL -IMP: NEGATIVE

## 2022-04-08 DIAGNOSIS — M545 Low back pain, unspecified: Secondary | ICD-10-CM | POA: Diagnosis not present

## 2022-04-13 ENCOUNTER — Encounter (HOSPITAL_BASED_OUTPATIENT_CLINIC_OR_DEPARTMENT_OTHER): Payer: Self-pay

## 2022-05-27 ENCOUNTER — Ambulatory Visit
Admission: RE | Admit: 2022-05-27 | Discharge: 2022-05-27 | Disposition: A | Discharge status: Home / Self Care | Payer: Exclusive Provider Organization | Attending: Internal Medicine | Admitting: Internal Medicine

## 2022-05-27 ENCOUNTER — Encounter (HOSPITAL_BASED_OUTPATIENT_CLINIC_OR_DEPARTMENT_OTHER): Payer: Self-pay

## 2022-05-27 ENCOUNTER — Other Ambulatory Visit: Payer: Self-pay

## 2022-05-27 DIAGNOSIS — Z1231 Encounter for screening mammogram for malignant neoplasm of breast: Secondary | ICD-10-CM | POA: Diagnosis present

## 2022-05-27 DIAGNOSIS — Z129 Encounter for screening for malignant neoplasm, site unspecified: Secondary | ICD-10-CM

## 2022-06-03 ENCOUNTER — Encounter (HOSPITAL_BASED_OUTPATIENT_CLINIC_OR_DEPARTMENT_OTHER): Payer: Self-pay | Admitting: Endocrinology

## 2022-06-03 ENCOUNTER — Other Ambulatory Visit: Payer: Self-pay

## 2022-06-03 ENCOUNTER — Ambulatory Visit: Payer: Exclusive Provider Organization | Attending: Endocrinology | Admitting: Endocrinology

## 2022-06-03 VITALS — BP 147/80 | HR 83 | Temp 97.8°F | Wt 152.0 lb

## 2022-06-03 DIAGNOSIS — R232 Flushing: Secondary | ICD-10-CM | POA: Diagnosis present

## 2022-06-03 DIAGNOSIS — M8589 Other specified disorders of bone density and structure, multiple sites: Secondary | ICD-10-CM | POA: Insufficient documentation

## 2022-06-03 DIAGNOSIS — Z8639 Personal history of other endocrine, nutritional and metabolic disease: Secondary | ICD-10-CM | POA: Diagnosis present

## 2022-06-03 DIAGNOSIS — R1013 Epigastric pain: Secondary | ICD-10-CM | POA: Diagnosis present

## 2022-06-03 DIAGNOSIS — R5383 Other fatigue: Secondary | ICD-10-CM | POA: Diagnosis present

## 2022-06-03 DIAGNOSIS — R5381 Other malaise: Secondary | ICD-10-CM | POA: Diagnosis present

## 2022-06-03 NOTE — Progress Notes (Signed)
Patient feels physically safe at home.

## 2022-06-03 NOTE — Patient Instructions (Addendum)
Zantac (rantidine) or Prilosec (omeprazole)   Try either of these and see your primary care provider if they do not help    Try black cohosh for the hot flashes

## 2022-06-03 NOTE — Progress Notes (Signed)
Presents for annual f/u of thyroid disorder and remote hx of pituitary Cushing's  HPI:  1. SC Hyperthyroidism  9/13 scalp hair loss and tired legs: TSH sent and found to be 0.17  10/13 ultrasound thyroid heterogeneous hypervascular; no nodules; right and left lobes both 4 cm; thyroid scan non-focal  11/13-2/14 took methimazole without improvement in symptoms    2/14 TSI 87 (negative); TPO 13; 50 g irregular goiter on exam  11/17 anti-TG negative  12/22 reporting palpitations but TSH slightly high; no tx yet    3. Hx of Cushings   1980 transphenoidal surgery in [place]  2012, 2014 work up showed normal pituitary function--> no residual disease  10/22 BMD: T-1.9 spine, T-2.0 left FN; T- 1.7 right FN, FRAX 9% and 1.3%    I have reviewed the patient's medical, social, family history and meds in detail and updated the computerized patient record.    Confirms that she's still taking calcium/D 600/800, two daily      ROS: reporting a lot of fatigue, low energy. Reports episodes of "heat", like a hot flash, associated with trouble catching breath- feels as if she will suffocate; relieved by drinking cold water.  Happens 2-3x daily for the past few years. Happening more frequently lately. Also reports epigastric pain for a few weeks; not related to anything she eats; constant ache, no severe.      PHYSICAL EXAMINATION.  BP 147/80 (Site: RA, Position: Sitting, Cuff Size: Reg)   Pulse 83   Temp 97.8 F (36.6 C) (Temporal)   Wt 68.9 kg (152 lb)   SpO2 100%   BMI 29.19 kg/m   Pain Score: Data Unavailable  Body mass index is 29.19 kg/m.  No acute distress. Well developed, well-nourished.   Eyes: Full extraocular movements. No proptosis, lid lag, or stare.   Neck: Thyroid slightly full firm irregular, no discrete nodules felt.  . No cervical lymphadenopathy appreciated.   Cardiovascular: Regular rate and rhythm, normal S1 and S2. No murmur/gallop/rub heard.  Respiratory: Clear to auscultation and percussion. Negative for  respiratory distress/rales.   WRU:EAVW non-tender no masses/megaly/rebound/guarding. No epigastric tenderness  Musculoskeletal: Without clubbing, cyanosis or edema. No kyphosis  Skin: Cool and dry to touch without obvious lesions.   Neurological: Intact with no tremor of the outstretched hands.  Psychiatric: Normal affect and responsiveness to questions.             HEMOGLOBIN A1C (%)   Date Value   03/22/2022 5.9 (H)   02/12/2021 5.8 (H)   05/29/2020 5.7 (H)     VITAMIN D,25 HYDROXY (ng/mL)   Date Value   02/12/2021 31   05/11/2018 27 (L)   09/20/2016 23 (L)           Latest Ref Rng 05/29/2019  3:18 PM 05/29/2020  4:36 PM 06/04/2021  4:22 PM   THYROID FLOWSHEET       Thyroid Screen TSH 0.270 - 4.200 uIU/mL 1.958  2.180  4.540 (H)    Thyroid Stim Hormone 0.358 - 3.740 uIU/mL      Free Thyroxine 0.93 - 1.70 ng/dL   1.08       (Z86.39) History of hyperthyroidism  (primary encounter diagnosis)  Comment: clinically euthyroid but reporting fatigue  Plan: TSH screen    (Z86.39) History of Cushing disease  Comment: resolved  Plan: monitor prn    (R53.81,  R53.83) Malaise and fatigue  Comment: could be thyroid related, but also will check ferritin and 25D  Plan: labs sent    (  R23.2) Hot flashes  Comment: could be related to menopause, as hot flashes can persist for years  Plan: try black cohosh    (M85.89) Osteopenia of multiple sites  Comment: due for repeat BMD in 10/24  Plan: ordered; then will see pt 11/24 to review    (R10.13) Epigastric pain  Comment: exam negative  Plan: can try ranitidine or omeprazole OTC; if does not improve, see PCP      I spent a total of 35 minutes on this visit on the date of service (total time includes all activities performed on the date of service)

## 2022-06-10 ENCOUNTER — Ambulatory Visit
Admission: RE | Admit: 2022-06-10 | Discharge: 2022-06-10 | Disposition: A | Discharge status: Home / Self Care | Payer: Exclusive Provider Organization | Attending: Endocrinology | Admitting: Endocrinology

## 2022-06-10 ENCOUNTER — Other Ambulatory Visit: Payer: Self-pay

## 2022-06-10 DIAGNOSIS — R5383 Other fatigue: Secondary | ICD-10-CM | POA: Diagnosis present

## 2022-06-10 DIAGNOSIS — R5381 Other malaise: Secondary | ICD-10-CM | POA: Insufficient documentation

## 2022-06-10 DIAGNOSIS — Z8639 Personal history of other endocrine, nutritional and metabolic disease: Secondary | ICD-10-CM | POA: Insufficient documentation

## 2022-06-10 LAB — FERRITIN: FERRITIN: 13 ng/mL (ref 13–150)

## 2022-06-10 LAB — VITAMIN D,25 HYDROXY: VITAMIN D,25 HYDROXY: 33 ng/mL (ref 30.0–100.0)

## 2022-06-10 LAB — THYROID SCREEN TSH REFLEX FT4: THYROID SCREEN TSH REFLEX FT4: 2.19 u[IU]/mL (ref 0.270–4.200)

## 2022-06-16 ENCOUNTER — Telehealth (HOSPITAL_BASED_OUTPATIENT_CLINIC_OR_DEPARTMENT_OTHER): Payer: Self-pay

## 2022-06-16 NOTE — Telephone Encounter (Signed)
Called patient as noted below via telephone interpreter ([place]). Patient confirmed an understanding and had no questions.      Hanley Ben, MD  [facility]  Please call pt  Her thyroid, vitamin D and iron are all normal. If she is still feeling unwell/fatigued, I suggest seeing her PCP

## 2022-08-28 ENCOUNTER — Encounter (HOSPITAL_BASED_OUTPATIENT_CLINIC_OR_DEPARTMENT_OTHER): Payer: Self-pay

## 2022-08-28 ENCOUNTER — Emergency Department
Admission: EM | Admit: 2022-08-28 | Discharge: 2022-08-28 | Disposition: A | Discharge status: Home / Self Care | Payer: Exclusive Provider Organization | Attending: Emergency Medicine | Admitting: Emergency Medicine

## 2022-08-28 ENCOUNTER — Emergency Department (HOSPITAL_BASED_OUTPATIENT_CLINIC_OR_DEPARTMENT_OTHER): Payer: Exclusive Provider Organization

## 2022-08-28 ENCOUNTER — Other Ambulatory Visit: Payer: Self-pay

## 2022-08-28 DIAGNOSIS — M25512 Pain in left shoulder: Secondary | ICD-10-CM | POA: Diagnosis present

## 2022-08-28 MED ORDER — NAPROXEN 500 MG PO TABS
500.0000 mg | ORAL_TABLET | Freq: Two times a day (BID) | ORAL | 0 refills | Status: DC | PRN
Start: 2022-08-28 — End: 2023-08-25

## 2022-08-28 NOTE — ED Triage Note (Signed)
PT C/O L SHOULDER PAIN RADIATING INTO L ELBOW FOR OVER 1 MONTH BUT HAS BEEN GETTING WORSE WITH TINGLING IN HER L HAND THE PAST WEEK. DENIES ANY C/P OR ABDOMINAL PAIN. HAS NOT TAKEN ANY OTC MEDS TODAY.

## 2022-08-28 NOTE — ED Provider Notes (Signed)
ED nursing record was reviewed. Prior records as available electronically through the Epic record were reviewed.    Patient was seen along with Dr. Jamse Arn and management was discussed with them.    Patient is [place] speaking and interview, exam, and final disposition were conducted via an official hospital interpreter.    HPI:    This 67 year old female presents to the Emergency Department with chief complaint of left shoulder pain.  Patient states for the past 1 months she has been having pain to the left shoulder.  No preceding trauma or injury.  She is primarily having pain to the posterior aspect of the shoulder.  The pain will radiate down the arm into the hand.  States 3 times a day she will have brief episode of tingling and numbness in all 5 fingers of the left hand.  She denies any fever, chills, chest pain, dyspnea, abdominal pain.  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      ROS: Pertinent positives were reviewed as per the HPI above. All other systems were reviewed and are negative.    Past Medical History/Problem list:  Past Medical History:  05/25/2012: Allergic conjunctivitis  No date: Breast lump      Comment:  1/00 Bx > fibroadenoma  [id number]: Helicobacter pylori (H. pylori)      Comment:  s/p treatment for 2 months  05/25/2012: Hyperopia with astigmatism and presbyopia  1999: Other and unspecified ovarian cyst  05/25/2012: Pinguecula of both eyes  Patient Active Problem List:     Overweight (BMI 25.0-29.9)     Vitamin D deficiency     Ovarian cyst     Allergic rhinitis     Renal anomaly     Pinguecula of both eyes     Allergic conjunctivitis     Hyperopia with astigmatism and presbyopia     History of Cushing disease     Osteoarthritis of both knees     Coccyx pain     Laryngopharyngeal reflux (LPR)     Palpitations     Goiter     S/P hysterectomy     History of hyperthyroidism     Epidermal cyst     Osteopenia of multiple sites        Past Surgical History: Past  Surgical History:  No date: BREAST BIOPSY      Comment:  negative right  needle Bx  1990: BRST RCNSTJ IMMT/DLYD W/TISS EXPANDER SBSQ XPNSJ      Comment:  s/p removal of excess skin  1980: CRANIEC TREPHINE BONE FLP BRAIN TUMOR SUPRTENTOR      Comment:  craniotomy age 45, tumor pituitary  1997: HYSTERECTOMY      Comment:  surgery for uterine fibroids. 7/01 Pap of  vag cuff                Negative; ovaries remain  No date: REDUCTION MAMMAPLASTY  1994: ROOT CANAL      Medications: No current facility-administered medications on file prior to encounter.  calcium carbonate + vitamin D (CALTRATE 600+D PLUS) 600-800 MG-UNIT TABS per tablet, Take 2 tablets by mouth in the morning., Disp: 60 tablet, Rfl: 11  polyethylene glycol (GLYCOLAX) 17 GM/SCOOP powder, Take 17 g by mouth daily as needed, Disp: 255 g, Rfl: 1          Social History:   Social History     Socioeconomic History    Marital status: Married     Spouse  name: Not on file    Number of children: 0    Years of education: Not on file    Highest education level: Not on file   Occupational History     Employer: [employer]   Tobacco Use    Smoking status: Never    Smokeless tobacco: Never   Substance and Sexual Activity    Alcohol use: No    Drug use: No    Sexual activity: Yes     Partners: Male   Other Topics Concern    Military Service Not Asked    Blood Transfusions Not Asked    Caffeine Concern Not Asked    Occupational Exposure Not Asked    Hobby Hazards Not Asked    Sleep Concern Not Asked    Stress Concern No    Weight Concern Not Asked    Special Diet Not Asked    Back Care Not Asked    Exercise Yes    Bike Helmet Not Asked    Seat Belt Not Asked    Self-Exams Not Asked   Social History Narrative    Works with [employer]il [employer], lives with husband, feels safe    From [place]    Arrived [place] 1985    No children        Gordan Payment, MD, 02/27/2018    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   SKIN:  Pink, warm, and dry. No erythema or rash.  HEAD:  Normocephalic, atraumatic. Sclerae are anicteric and non-injected.   NECK:  No C-spine tenderness, crepitus, step-off. Supple with full range of motion.   LUNGS:  Clear to auscultation bilaterally. No wheezes, rales, rhonchi.   HEART:  RRR.  No appreciated murmurs.   EXTREMITIES:  No obvious deformities.  No swelling, erythema, ecchymosis to the left upper extremity.  Minimal tenderness to the left posterior shoulder.  No other tenderness to the left upper extremity.  Full ROM in the upper extremities bilaterally.  2+ radial pulses bilaterally.  BACK: No spinal tenderness, crepitus, or step offs.   NEUROLOGIC:  Alert and oriented x4; moves all extremities well; speaking in clear fluent sentences.  Sensation intact to light touch to the upper extremities bilaterally. 5/5 strength to the upper extremities bilaterally.  PSYCHIATRIC:  Appropriate for age, time of day, and situation  RESULTS  No results found for this visit on 08/28/22 (from the past 24 hour(s)).     RADIOLOGY  No results found.      MEDICATIONS ADMINISTERED ON THIS VISIT  Orders Placed This Encounter      naproxen (NAPROSYN) 500 MG tablet          Sig: Take 1  tablet by mouth every 12 (twelve) hours as needed  for up to 7 days          Dispense:  14 tablet          Refill:  0         ED Course and Medical Decision-making:  This 67 year old female patient presents for evaluation of atraumatic left shoulder pain x 1 month.  She is in no acute distress, vitals and exam as above.    Consider cervical radiculopathy versus arthritis versus bursitis.  X-ray without evidence of fracture, dislocation, or other acute process on interpretation.  No signs of infection, DVT, ischemia.  Recommend NSAIDs and outpatient follow-up with PCP and/or orthopedics.  Referral placed.  Given strict return precautions.    Patient was instructed to follow-up with PCP in 7days for re-evaluation.  Signs and symptoms for which to return to the Emergency Department were discussed with the patient in detail, and they understand and are in agreement with their care plan at this time.    The patient remained hemodynamically stable throughout entire visit.    Condition: Stable  Disposition: Home      Diagnosis/Diagnoses:  Acute pain of left shoulder    Cyril Loosen, PA-C  [facility]    This Emergency Department patient encounter note was created using voice-recognition software and in real time during the ED visit. Please excuse any typographical errors that have not been edited out.

## 2022-08-28 NOTE — Narrator Note (Signed)
Patient Disposition  Patient education for diagnosis, medications, activity, diet and follow-up.  Patient left ED 6:16 PM.  Patient rep received written instructions.    Interpreter to provide instructions: No    Patient belongings with patient: YES    Have all existing LDAs been addressed? N/A    Have all IV infusions been stopped? N/A    Destination: Discharged to home

## 2022-09-06 ENCOUNTER — Ambulatory Visit
Payer: Exclusive Provider Organization | Attending: Physical Medicine & Rehabilitation | Admitting: Physical Medicine & Rehabilitation

## 2022-09-06 ENCOUNTER — Ambulatory Visit (HOSPITAL_BASED_OUTPATIENT_CLINIC_OR_DEPARTMENT_OTHER): Payer: Exclusive Provider Organization | Admitting: Orthopaedic Surgery

## 2022-09-06 ENCOUNTER — Other Ambulatory Visit: Payer: Self-pay

## 2022-09-06 ENCOUNTER — Telehealth (HOSPITAL_BASED_OUTPATIENT_CLINIC_OR_DEPARTMENT_OTHER): Payer: Self-pay

## 2022-09-06 VITALS — Ht 60.24 in | Wt 157.0 lb

## 2022-09-06 DIAGNOSIS — M5412 Radiculopathy, cervical region: Secondary | ICD-10-CM | POA: Diagnosis present

## 2022-09-06 MED ORDER — TRAMADOL HCL 50 MG PO TABS
50.0000 mg | ORAL_TABLET | Freq: Four times a day (QID) | ORAL | 0 refills | Status: DC | PRN
Start: 2022-09-06 — End: 2022-09-30

## 2022-09-06 MED ORDER — CYCLOBENZAPRINE HCL 5 MG PO TABS
5.00 mg | ORAL_TABLET | Freq: Two times a day (BID) | ORAL | 2 refills | Status: AC | PRN
Start: 2022-09-06 — End: 2022-12-05

## 2022-09-06 NOTE — Progress Notes (Signed)
SUBJECTIVE:   Kayla Garza is a 67 year old female who complains of pain radiating from left shoulder to her arm with numbness worse on the left D2-4 digits x1 month.  She is accompanied by her husband.  Began acutely without injury; no neck pain.  Constant numbness and tingling in the fingers.  Pain is constant, no specific aggravating factors.  Was seen in the ED on 3/ 9 and prescribed Naprosyn which had no effect.  Currently not physically active but continues working.    Quantity: 9/10 in intensity    PT: No  Pain Meds: Naprosyn without effect  Psychiatric:     Past Pain injections: No    Work: Scientist, product/process development  Social: Lives with husband    Imaging: None      REVIEW OF SYSTEMS:  All systems reviewed and are negative except as stated in the HPI.    PAST MEDICAL HISTORY:  Past Medical History:  05/25/2012: Allergic conjunctivitis  No date: Breast lump      Comment:  1/00 Bx > fibroadenoma  [id number]: Helicobacter pylori (H. pylori)      Comment:  s/p treatment for 2 months  05/25/2012: Hyperopia with astigmatism and presbyopia  1999: Other and unspecified ovarian cyst  05/25/2012: Pinguecula of both eyes    PAST SURGICAL HISTORY:  Past Surgical History:  No date: BREAST BIOPSY      Comment:  negative right  needle Bx  1990: BRST RCNSTJ IMMT/DLYD W/TISS EXPANDER SBSQ XPNSJ      Comment:  s/p removal of excess skin  1980: CRANIEC TREPHINE BONE FLP BRAIN TUMOR SUPRTENTOR      Comment:  craniotomy age 56, tumor pituitary  1997: HYSTERECTOMY      Comment:  surgery for uterine fibroids. 7/01 Pap of  vag cuff                Negative; ovaries remain  No date: REDUCTION MAMMAPLASTY  1994: ROOT CANAL    FAMILY HISTORY:  Review of patient's family history indicates:  Problem: Osteoporosis      Relation: Mother          Age of Onset: (Not Specified)  Problem: Pulmonary      Relation: Mother          Age of Onset: (Not Specified)          Comment: COPD? smoker  Problem: VASCULAR      Relation: Father          Age of Onset: (Not  Specified)          Comment: ? DVT  Problem: Psychiatric Illness      Relation: Brother          Age of Onset: (Not Specified)          Comment: depression  Problem: Cancer - Breast      Relation: FamHxNeg          Age of Onset: (Not Specified)  Problem: Cancer - Colon      Relation: FamHxNeg          Age of Onset: (Not Specified)  Problem: Diabetes      Relation: FamHxNeg          Age of Onset: (Not Specified)  Problem: Heart      Relation: FamHxNeg          Age of Onset: (Not Specified)      SOCIAL HISTORY:  Social History     Socioeconomic History  Marital status: Married     Spouse name: Not on file    Number of children: 0    Years of education: Not on file    Highest education level: Not on file   Occupational History     Employer: [employer]   Tobacco Use    Smoking status: Never    Smokeless tobacco: Never   Substance and Sexual Activity    Alcohol use: No    Drug use: No    Sexual activity: Yes     Partners: Male   Other Topics Concern    Military Service Not Asked    Blood Transfusions Not Asked    Caffeine Concern Not Asked    Occupational Exposure Not Asked    Hobby Hazards Not Asked    Sleep Concern Not Asked    Stress Concern No    Weight Concern Not Asked    Special Diet Not Asked    Back Care Not Asked    Exercise Yes    Bike Helmet Not Asked    Seat Belt Not Asked    Self-Exams Not Asked   Social History Narrative    Works with Soil scientist, lives with husband, feels safe    From [place]    Arrived [place] 1985    No children        Gordan Payment, MD, 02/27/2018    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)    No children           Social Determinants of Librarian, academic Strain: Not on file  Food Insecurity: Not on file  Transportation Needs: Not on  file  Physical Activity: Not on file  Stress: Not on file  Social Connections: Not on file  Intimate Partner Violence: Not on file  Housing Stability: Not on file    ALLERGIES:  Review of Patient's Allergies indicates:  No Known Allergies    CURRENT MEDICATIONS:  naproxen (NAPROSYN) 500 MG tablet, Take 1 tablet by mouth every 12 (twelve) hours as needed  for up to 7 days, Disp: 14 tablet, Rfl: 0  calcium carbonate + vitamin D (CALTRATE 600+D PLUS) 600-800 MG-UNIT TABS per tablet, Take 2 tablets by mouth in the morning., Disp: 60 tablet, Rfl: 11  polyethylene glycol (GLYCOLAX) 17 GM/SCOOP powder, Take 17 g by mouth daily as needed, Disp: 255 g, Rfl: 1    No current facility-administered medications on file prior to visit.      OBJECTIVE:  Ht 5' 0.24" (1.53 m)   Wt 71.2 kg (157 lb)   BMI 30.42 kg/m     General: normal gait, NAD  Psych: AxO x3, normal affect.  HEENT: normocephalic/atraumatic  CV: warm extremitites  Pulm: no accessory muscle use with mild exertion, no clubbing.  Abd: soft, NT    MSK: No significant cervical tenderness, mildly restricted cervical rotation to left  no muscle atrophy noted b/l UEs  Skin: normal, no rashes    Neuro: DTR 1+ b/l UE, motor intact b/l UE   Negative Hoffman's, no clonus        LABS REVIEWED:  HEMOGLOBIN A1C  Date Value Ref Range Status   03/22/2022 5.9 (H) 4.0 - 5.6 % Final     Comment:     Hemoglobin A1C         Interpretive information    5.7 - 6.4 %            Increased risk for diabetes;                         recommend lifestyle management        > 6.4 %            Diagnosis of diabetes; should have at                         least 2 elevated results for                         diagnosis of diabetes.        > 8.0 %            Action suggested        < 7.0 %            Goal of therapy for diabetics; higher                         targets may apply for some with                         specific comorbidities.     02/12/2021 5.8 (H) 4.0 - 5.6 % Final     Comment:      Hemoglobin A1C         Interpretive information    5.7 - 6.4 %            Increased risk for diabetes;                         recommend lifestyle management        > 6.4 %            Diagnosis of diabetes; should have at                         least 2 elevated results for                         diagnosis of diabetes.        > 8.0 %            Action suggested        < 7.0 %            Goal of therapy for diabetics; higher                         targets may apply for some with                         specific comorbidities.     05/29/2020 5.7 (H) 4.0 - 5.6 % Final     Comment:     Hemoglobin A1C         Interpretive information    5.7 - 6.4 %            Increased risk for  diabetes;                         recommend lifestyle management        > 6.4 %            Diagnosis of diabetes; should have at                         least 2 elevated results for                         diagnosis of diabetes.        > 8.0 %            Action suggested        < 7.0 %            Goal of therapy for diabetics; higher                         targets may apply for some with                         specific comorbidities.       WHITE BLOOD CELL COUNT   Date Value Ref Range Status   09/20/2016 5.1 4.0 - 11.0 TH/uL Final   05/07/2015 5.4 4.0 - 11.0 TH/uL Final     BUN (UREA NITROGEN)   Date Value Ref Range Status   05/11/2018 15 7 - 18 mg/dL Final   07/21/2010 17 6 - 20 mg/dl Final     CREATININE   Date Value Ref Range Status   05/11/2018 0.8 0.4 - 1.2 mg/dL Final   07/21/2010 0.7 0.4 - 1.2 mg/dl Final     ALANINE AMINOTRANSFERASE   Date Value Ref Range Status   05/11/2018 26 12 - 45 U/L Final   08/17/2012 24 7 - 35 IU/L Final     ASPARTATE AMINOTRANSFERASE   Date Value Ref Range Status   05/11/2018 19 8 - 34 U/L Final   08/17/2012 25 8 - 34 IU/L Final     TSH (THYROID STIM HORMONE)   Date Value Ref Range Status   05/17/2017 2.080 0.358 - 3.740 uIU/mL Final          ASSESSMENT:   Acute left cervical radiculopathy      PLAN:  Diagnosis, and treatment options were discussed with pt.   - PT referral  - C MRI  -Medication management: Trial Flexeril 5 mg twice daily as needed, tramadol as needed.Risks and benefits of the medication were discussed, pt understands not to drive or operate machinery if dizzy or somnolent.   -Pending above, consider CESI    f/u  TV after MRI

## 2022-09-06 NOTE — Telephone Encounter (Signed)
Thru interpreter, eliz [phone] appt has been changed to 3p instead of 3:30p for today

## 2022-09-16 ENCOUNTER — Other Ambulatory Visit: Payer: Self-pay

## 2022-09-16 ENCOUNTER — Ambulatory Visit: Payer: Exclusive Provider Organization | Attending: Physical Medicine & Rehabilitation

## 2022-09-16 DIAGNOSIS — R200 Anesthesia of skin: Secondary | ICD-10-CM | POA: Insufficient documentation

## 2022-09-16 DIAGNOSIS — M542 Cervicalgia: Secondary | ICD-10-CM | POA: Insufficient documentation

## 2022-09-16 DIAGNOSIS — M5412 Radiculopathy, cervical region: Secondary | ICD-10-CM | POA: Insufficient documentation

## 2022-09-16 DIAGNOSIS — M79602 Pain in left arm: Secondary | ICD-10-CM | POA: Insufficient documentation

## 2022-09-16 NOTE — Progress Notes (Unsigned)
Outpatient Physical Therapy Initial Evaluation  [facility]: [place]      ICD-10: No primary diagnosis found.  Date of Onset: neck pain started a month ago; numbness/pain worse two weeks ago  Referring Provider: Patient was evaluated by Henri Medal, MD and has been referred to physical therapy for evaluation and treatment.     History of Present Illness: Kayla Garza a 67 year old female with chief complaints of neck, shoulder, arm, and hand/fingers. Fingers 2 and 3 are always numb. Numbness started and pain got worse two weeks ago. Has had neck pain in the past but not like this.    Imaging: MRI on 4/2    Relevant PMH: ***    Patient learns best by: demonstration, verbal instructions, written instructions    Patient Active Problem List:  Patient Active Problem List:     Overweight (BMI 25.0-29.9)     Vitamin D deficiency     Ovarian cyst     Allergic rhinitis     Renal anomaly     Pinguecula of both eyes     Allergic conjunctivitis     Hyperopia with astigmatism and presbyopia     History of Cushing disease     Osteoarthritis of both knees     Coccyx pain     Laryngopharyngeal reflux (LPR)     Palpitations     Goiter     S/P hysterectomy     History of hyperthyroidism     Epidermal cyst     Osteopenia of multiple sites      Medications:   cyclobenzaprine (FLEXERIL) 5 MG tablet, Take 1 tablet by mouth 2 (two) times daily as needed, Disp: 60 tablet, Rfl: 2  traMADol (ULTRAM) 50 MG tablet, Take 1 tablet by mouth every 6 (six) hours as needed for Pain, Disp: 60 tablet, Rfl: 0  naproxen (NAPROSYN) 500 MG tablet, Take 1 tablet by mouth every 12 (twelve) hours as needed  for up to 7 days, Disp: 14 tablet, Rfl: 0  calcium carbonate + vitamin D (CALTRATE 600+D PLUS) 600-800 MG-UNIT TABS per tablet, Take 2 tablets by mouth in the morning., Disp: 60 tablet, Rfl: 11  polyethylene glycol (GLYCOLAX) 17 GM/SCOOP powder, Take 17 g by mouth daily as needed, Disp: 255 g, Rfl: 1    No current  facility-administered medications on file prior to visit.        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 yes    -----------------------------------    Objective (did not measure this visit if left blank)    Alignment/Posture/Observation  ***    Handedness: ***    Test of Function   UE AROM  Flx, abd, ext: wfl with pain in the upper arm    Palpation  ***    Neuro Screen:  Sensation: decreased L digit 2 and 3 front and back; back of forearm, whole upper arm, shoulder, neck; sig decreased sensation around clavicle    Reflexes: C6, C7, C8 2+ B    Cervical Screen:   Motion Symptoms   Flexion wfl Sig pain just to the L of CT   Extension Limited by pain Sig pain midline   L side bend limited Pain in L arm > RSB   R side bend wfl Pain L arm   L rotation limited Pain in shoulder/arm   R rotation wfl Little pain in shoulder     Compression: -  Distraction: -  Spurling's:       Muscle Strength   L R   SHOULDER   flexion 4-/5 little pain 4/5   extension     abduction Weak, pain 5/5   ER Weak, pain in arm 5/5   IR 5/5 5/5   SCAPULAE   rhomboid/MT     upper trap     serratus anterior     lower trap     ELBOW   flexion     extension     pronation     supination     WRIST   flexion     extension     HAND   grip 46lb 70lb   digits 2-5     thumb       Muscle Endurance (second holds)   L R   SHOULDER   Abd 0deg  Elbow Flx 90deg      Abd 90deg     Flx 90deg     SCAPULAE   rhomboid/MT     Lower trap     FUNCTIONAL/WORK            Joint mobility  ***    Special Tests:   +/- Indication   Spurling's  Radic   Distraction  Radic   Sharp-Purser  Transverse lig/atlanto-axial integrity   Rotation Lateral Flexion  1st rib hypomobility   Vertebral A.     Flexion Rotation  Upper cx involvement     Physical Therapy Plan of Care    PCP:  Kayla Saa, APRN    Referring Provider:   Henri Medal, MD  [place],  [place] [address]    Diagnosis: No primary diagnosis found.       Assessment: Kayla Garza is a 67 year old year old female who presents to [place] outpatient physical therapy with c/o ***.    Based on today's PT evaluation, signs and symptoms are most consistent with ***. The objective exam indicates deficits with strength, ROM, mobility, stability, motor control, ms tissue density, ms length, nerve extensibility/mobility, functional mobility. These impairments limit the patient's ability to *** which is affecting their ability to participate in ***.    Barriers taken into consideration for treatment: ***. The rehabilitation potential for this patient is *** due to above listed barriers, nature of diagnosis, and clinical exam.    Short Term Goals: (*** weeks)  - Pt will be independent with HEP  - Pt will improve pain with cx AROM  - Pt will have decreased pain by 2 on pain scale  - Pt will demonstrate improved postural awareness  - Pt will demonstrate centralizing symptoms    Long Term Goals: (*** weeks)  - Pt will demonstrate 5/5 MMT throughout UE  - Pt will demonstrate increased motor control during functional tasks  - Pt will demonstrate  cx ROM WNL with min-to-no pain  - Pt will continue to be independent with HEP  - Pt will be able to *** with minimal to no pain  - Pt will return to ***     Treatment Plan: {OT/PT TREATMENT PLAN:12724}    Recommendations: Patient will benefit from skilled PT {NUMBERS 1-6:6} times per  week for {NUMBERS 1-12:10} weeks. A reassessment for future physical therapy needs will occur every 30 days. The frequency and duration may be tapered or increased as treatment progresses based on the therapist's judgment of factors, including but not limited to, co-morbidities, tissue healing, patient/caregiver independent self management, the ability to participate in/attend/receive therapy based on the patient's medical stability and competing care or life priorities.    Patient Regis Bill is aware of attendance policy: [id number]  Plan of care discussed with Patient/Family: {Yes/No/NA:10764::"Yes"}  Patient goals reviewed and incorporated in plan of care: {Yes/No/NA:10764::"Yes"}  Patient/Family agrees with plan of care: {Yes/No/NA:10764::"Yes"}  Patient/Family education: {Yes/No/NA:10764::"Yes"}  Does patient feel safe at home? {YES/DEFAULT NO:15777::"yes"}    Marylou Mccoy, PT, DPT  License # [id number]  Staff Physical [place]: [facility]  [email or web address]  Phone: [phone]  [place], [place], Lic # [id number]

## 2022-09-17 DIAGNOSIS — M5412 Radiculopathy, cervical region: Secondary | ICD-10-CM | POA: Diagnosis present

## 2022-09-17 DIAGNOSIS — M542 Cervicalgia: Secondary | ICD-10-CM | POA: Diagnosis present

## 2022-09-17 DIAGNOSIS — R200 Anesthesia of skin: Secondary | ICD-10-CM | POA: Diagnosis present

## 2022-09-17 DIAGNOSIS — M79602 Pain in left arm: Secondary | ICD-10-CM | POA: Insufficient documentation

## 2022-09-17 NOTE — Progress Notes (Signed)
09/16/22 1500   Language Information   Language Needs Met By: Telephone Interpreter   Precautions   Precautions Yes   Other   Other (Comments) vit D deficiency; cushing's   Rehab Discipline   Rehab Discipline PT   Visit   Visit number 1   POC Due date 10/16/22   Time Calculation   Start Time 1430   Stop Time 1520   Time Calculation (min) 50 min   Manual Therapy   Technique to add: UT STM; thoracic mob   Ther Exercise   Exercise supine cx retraction   Reps 10   Ther Exercise 2   Exercise supine elbow press   Reps 2 10   Ther Exercise 3   Exercise 3 slump/upright   Reps 3 10   Patient Education   What was taught? HEP, POC, posture,   Method Verbal;Demo;Practice;Written   Patient comprehension Yes       Marylou Mccoy, [place], Lic # [id number]

## 2022-09-21 ENCOUNTER — Other Ambulatory Visit: Payer: Self-pay

## 2022-09-21 ENCOUNTER — Ambulatory Visit
Admission: RE | Admit: 2022-09-21 | Discharge: 2022-09-21 | Disposition: A | Discharge status: Home / Self Care | Payer: Exclusive Provider Organization | Attending: Diagnostic Radiology | Admitting: Diagnostic Radiology

## 2022-09-21 DIAGNOSIS — M4802 Spinal stenosis, cervical region: Secondary | ICD-10-CM | POA: Diagnosis present

## 2022-09-21 DIAGNOSIS — M5030 Other cervical disc degeneration, unspecified cervical region: Secondary | ICD-10-CM | POA: Diagnosis present

## 2022-09-21 DIAGNOSIS — M50222 Other cervical disc displacement at C5-C6 level: Secondary | ICD-10-CM | POA: Insufficient documentation

## 2022-09-21 DIAGNOSIS — M5412 Radiculopathy, cervical region: Secondary | ICD-10-CM

## 2022-09-21 DIAGNOSIS — M50223 Other cervical disc displacement at C6-C7 level: Secondary | ICD-10-CM | POA: Diagnosis present

## 2022-09-27 ENCOUNTER — Ambulatory Visit (HOSPITAL_BASED_OUTPATIENT_CLINIC_OR_DEPARTMENT_OTHER): Payer: Exclusive Provider Organization | Admitting: Rehabilitative and Restorative Service Providers"

## 2022-09-27 ENCOUNTER — Other Ambulatory Visit: Payer: Self-pay

## 2022-09-27 DIAGNOSIS — M79602 Pain in left arm: Secondary | ICD-10-CM | POA: Diagnosis present

## 2022-09-27 DIAGNOSIS — R200 Anesthesia of skin: Secondary | ICD-10-CM | POA: Diagnosis present

## 2022-09-27 DIAGNOSIS — M542 Cervicalgia: Secondary | ICD-10-CM | POA: Insufficient documentation

## 2022-09-27 DIAGNOSIS — M5412 Radiculopathy, cervical region: Secondary | ICD-10-CM | POA: Insufficient documentation

## 2022-09-27 NOTE — Progress Notes (Signed)
Subjective:    I feel the same since my evaluation two weeks ago. My two fingers are still numb and the pain is there still in my neck.     Objective:       09/27/22 1800   Language Information   Language Needs Met By: Telephone Interpreter   Precautions   Precautions Yes   Other   Other (Comments) vit D deficiency; cushing's   Rehab Discipline   Rehab Discipline PT   Visit   Visit number 2   POC Due date 10/16/22   Time Calculation   Start Time 1830   Stop Time 1857   Time Calculation (min) 27 min   Manual Therapy   Technique cervical paraspinal STM   Time in minutes 5   Manual Therapy 2   Technique SOR and cervical traction   Time in minutes 5   Ther Exercise   Exercise supine chin tucks   Reps 2   Sets 5   Holds hold 5'   Ther Exercise 2   Exercise supine chin tucks + scap retraction (without UT compensation)   Reps 2 10   Sets 2 2   Holds 2 hold 5'   Patient Education   What was taught? use of a small towel for cervical support when sleeping, PT POC, HEP   Method Practice   Patient comprehension Yes       Assessment:    Pt required cues for isolated scapular retractions while avoiding UT compensation, good carryover. She reports dec pain following tx, "my shoulders feel more relaxed now" with improved understanding on PT POC regarding pain and MRI findings.    Plan:    Cont per tol with DNF strengthening and postural training to assist with ADL's.     Marney Setting, PT, Lic # [id number]

## 2022-09-30 ENCOUNTER — Encounter (HOSPITAL_BASED_OUTPATIENT_CLINIC_OR_DEPARTMENT_OTHER): Payer: Self-pay | Admitting: Internal Medicine

## 2022-09-30 ENCOUNTER — Ambulatory Visit: Payer: Exclusive Provider Organization | Attending: Internal Medicine | Admitting: Internal Medicine

## 2022-09-30 ENCOUNTER — Other Ambulatory Visit: Payer: Self-pay

## 2022-09-30 VITALS — BP 141/82 | HR 75 | Temp 97.9°F | Wt 157.2 lb

## 2022-09-30 DIAGNOSIS — M79602 Pain in left arm: Secondary | ICD-10-CM | POA: Diagnosis present

## 2022-09-30 DIAGNOSIS — L989 Disorder of the skin and subcutaneous tissue, unspecified: Secondary | ICD-10-CM | POA: Diagnosis present

## 2022-09-30 DIAGNOSIS — M4802 Spinal stenosis, cervical region: Secondary | ICD-10-CM | POA: Diagnosis present

## 2022-09-30 DIAGNOSIS — R200 Anesthesia of skin: Secondary | ICD-10-CM | POA: Diagnosis not present

## 2022-09-30 MED ORDER — DICLOFENAC SODIUM 1 % EX GEL
2.0000 g | Freq: Four times a day (QID) | CUTANEOUS | 0 refills | Status: DC
Start: 2022-09-30 — End: 2023-05-09

## 2022-09-30 NOTE — Progress Notes (Unsigned)
Kayla Garza is a 67 year old female, I last saw her in Oct/23    Today with c/o    Gradual onset of L shoulder pain for a few months  Ignored it at first  Gradually worsened, developed paresthesia on L arm, constant  Seen in the ED in March, referred to ortho  Seen by Dr Alesia Banda, at last he ordered MRI    MRI Cervical Spine WO Contrast  Narrative: CLINICAL INDICATION: Neck pain, chronic     COMPARISON: None    TECHNIQUE: Cervical spine MRI using sagittal inversion recovery and TI, sagittal and axial T2, and axial gradient echo sequences.    FINDINGS:    BONES: There is normal marrow signal. No marrow edema to suggest bony lesion or fracture.    ALIGNMENT: No spondylolisthesis or facet malalignment. Normal cervical lordosis.    CORD: The spinal cord is normal in caliber and signal intensity.     EPIDURAL/PARASPINAL: No epidural or paraspinal mass or fluid collection.    LEVEL BY LEVEL ANALYSIS:    C2-C3: No central canal or neural foraminal narrowing.    C3-C4: No central canal or neural foraminal narrowing.    C4-C5: No central canal or neural foraminal narrowing.    C5-C6: Central and right paracentral disc bulge resulting in mild central canal narrowing. Bilateral neural foraminal disc bulge resulting in mild bilateral neural foraminal narrowing.    C6-C7: There are minimal endplate changes. There is a central disc bulge and disc osteophyte resulting in mild central canal narrowing. There is a left neural foraminal disc herniation resulting in severe left neuroforaminal narrowing. The right neural foramen is patent.    C7-T1: No central canal or neural foraminal narrowing.    Paraspinal and paravertebral soft tissues are unremarkable. Major vessels of the neck demonstrate expected signal void. Scattered cysts anterior posterior cervical lymph nodes however, not enlarged by size criteria. Limited localizer images are unremarkable. There is extensive opacification of the sphenoid sinuses best  seen in the sagittal views.  Impression:      Mild multilevel degenerative disc disease with small central disc bulge and disc osteophyte and neural foraminal disc bulge resulting in mild central canal narrowing and mild neural foraminal narrowing as detailed above. There is a left neural foraminal disc herniation at C6-C7 with moderate to severe left neural foraminal narrowing.    Reviewed and Electronically Signed By: Harolyn Rutherford, MD  Signed Date and Time: 09/21/2022 11:19 AM        Doing PT, it was causing more pain  Ended up going to chiropractor  had 8 sessions, 2x wk, she's noticing improvement of sx/pain  But when she goes back to work, the pain worsens again    Overall, the pain has improved "70%", but she's concerned about the recurrence triggered by work  Still has pain and tingling on l arm  Has been taking several days off at work    She took tramadol and muscle relaxant on 2 occasions, disc bc she was "groggy all the time"  She uses a topical gel that she feels it's helpful    Talked to boss, who recommended applying for  FMLA   Continuous leave x 2 months to allow for complete healing and recovery. She brought paperwork    She's also noticing skin lesions and come and go. They are similar in appearance, start small, expand, and heal. A few weeks later, a similar lesion appears in another part of there body. They  fully heal. This has been going on for several years. Has one in back, and one on L arm today    OBJECTIVE:  BP 141/82   Pulse 75   Temp 97.9 F (36.6 C) (Temporal)   Wt 71.3 kg (157 lb 3.2 oz)   SpO2 99%   BMI 30.46 kg/m   Pleasant, in nad  Heart: S1 and S2 normal, no murmurs, clicks, gallops or rubs. Regular rate and rhythm.   Lungs:  clear; no wheezes, rhonchi or rales.  MSK: no deformities. FROM on neck and upper extremities, but movements trigger pain  Skin: flat, scaly lesion, oval, in back, and similar lesion on L arm. NO other parts noticed. Pt denies lesions on palm of hand.  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 She's doing PT as seeing chiropractic, pain is improving, but she notices exacerbation of pain when she goes to work.   -- paperwork for FMLA filled out (after the visit, and left to be scanned/ pt notified)  -- Continue current regimen, and FU with physiatry as scheduled  -- Trial voltaren gel  -- Chiropractic is concerned about focal numbness on L wrist and hand, ? CTS >> referral to Dr Olam Idler for further eval  Plan: diclofenac (VOLTAREN) 1 % GEL Gel, REFERRAL TO         ORTHOPEDICS (INT)          (L98.9) Skin lesions  Comment: skin lesions and come and go. They are similar in appearance, start small, expand, and heal. A few weeks later, a similar lesion appears in another part of there body. They fully heal. This has been going on for several years. Has one in back, and one on L arm today. Unclear etiology  Pictures taken  Plan: REFERRAL TO DERMATOLOGY (INT)      The patient was ready to learn and no apparent learning or adherence barriers were identified. I explained the diagnosis and treatment plan, and the patient expressed understanding of the content. I attempted to answer any questions regarding the diagnosis and the proposed treatment.    We discussed the patient's current medications.  We discussed the importance of medication compliance. The patient expressed understanding and no barriers to adherence were identified.    follow-up prn  she has been advised to call or return with any worsening or new problems

## 2022-10-01 ENCOUNTER — Encounter (HOSPITAL_BASED_OUTPATIENT_CLINIC_OR_DEPARTMENT_OTHER): Payer: Self-pay

## 2022-10-01 NOTE — Progress Notes (Signed)
LVM for the pt with [name], informed her of her FMLA for being ready.    Form left on the 1st flr for pick up. Copy scanned.    Kayla Garza December, 10/01/2022

## 2022-10-07 ENCOUNTER — Encounter (HOSPITAL_BASED_OUTPATIENT_CLINIC_OR_DEPARTMENT_OTHER): Payer: Exclusive Provider Organization | Admitting: Internal Medicine

## 2022-10-08 ENCOUNTER — Ambulatory Visit: Payer: Exclusive Provider Organization | Attending: Dermatology | Admitting: Dermatology

## 2022-10-08 ENCOUNTER — Other Ambulatory Visit: Payer: Self-pay

## 2022-10-08 DIAGNOSIS — R21 Rash and other nonspecific skin eruption: Secondary | ICD-10-CM | POA: Diagnosis present

## 2022-10-08 NOTE — Progress Notes (Signed)
[facility]  [address], [place], [place] [address]    Dermatology Clinic Note    CC: Rash.   ?  HPI: Kayla Garza is a 67 year old female     She has a several years history of a rash that comes and goes. No prior treatment.   ?  ROS: negative, no other skin complaints.  Feels well, no systemic complaints.   ?  Past Dermatologic hx:  ?  PMH:  Patient Active Problem List:     Overweight (BMI 25.0-29.9)     Vitamin D deficiency     Ovarian cyst     Allergic rhinitis     Renal anomaly     Pinguecula of both eyes     Allergic conjunctivitis     Hyperopia with astigmatism and presbyopia     History of Cushing disease     Osteoarthritis of both knees     Coccyx pain     Laryngopharyngeal reflux (LPR)     Palpitations     Goiter     S/P hysterectomy     History of hyperthyroidism     Epidermal cyst     Osteopenia of multiple sites     Neck pain, acute     Pain and numbness of left upper extremity    ?  Meds:  ?     Medication List            Accurate as of October 08, 2022  5:00 PM. If you have any questions, ask your nurse or doctor.                CONTINUE taking these medications      calcium carbonate + vitamin D 600-800 MG-UNIT Tabs per tablet  Take 2 tablets by mouth in the morning.     cyclobenzaprine 5 MG tablet  Commonly known as: FLEXERIL  Take 1 tablet by mouth 2 (two) times daily as needed     diclofenac 1 % Gel Gel  Commonly known as: Voltaren  Apply 2 g topically in the morning and 2 g at noon and 2 g in the evening and 2 g before bedtime.     naproxen 500 MG tablet  Commonly known as: NAPROSYN  Take 1 tablet by mouth every 12 (twelve) hours as needed  for up to 7 days            ?  All:  Review of Patient's Allergies indicates:  No Known Allergies?     SHx:  ?Social History    Tobacco Use      Smoking status: Never      Smokeless tobacco: Never    Alcohol use: No    Drug use: No       FHx:  Hx of melanoma - , hx of NMSC -  ?  Physical exam/Assessment/Plan:  Well appearing pt in NAD  Mood and  affect are wnl  A skin examination was performed including head, eyes, part of arms, upper ches (bra not removed), back (bra not removed), b/l lower extremities, b/l soles, abdomen.   Skin type: II    # Rash. Only 2 red papules, inflamed on the L breast and central chest. Ddx includes PLEVA vs LyP vs papular urticaria vs a hypersensitivity reaction  Punch biopsy:   PUNCH BIOPSY PROCEDURE NOTE  -informed consent obtained, time out performed  -lesion was anesthetized with Lidocaine 2% w/ epi  -Punch biopsy performed in the  following location(s): L breast  -wound closed and hemostasis achieved using simplex interrupted sutures  -wound dressed using vaseline and a bandaid  -wound care instructions were given  -the specimen will be sent to pathology and the patient will be notified of the results    PATIENT/PROCEDURE VERIFICATION DOCUMENTATION    Correct patient: Yes  Correct procedure: Yes  Correct side, site, mark visible if applicable: Yes  Correct position: Yes  Special equipment/implant(s) present, if applicable: NA    Time-out completed, documented by provider doing procedure or designated team member:  Marlowe Shores, MD   [phone]     Suture removal to be scheduled in 14 days.     ?RTC: 2 weeks for s/r, in 41mo with me  ?  ?  Maudry Mayhew, MD  [facility]         CC: Irine Seal, APRN  [address] [facility]  [place] [address]

## 2022-10-12 ENCOUNTER — Ambulatory Visit
Payer: Exclusive Provider Organization | Attending: Physical Medicine & Rehabilitation | Admitting: Physical Medicine & Rehabilitation

## 2022-10-12 DIAGNOSIS — M5412 Radiculopathy, cervical region: Secondary | ICD-10-CM | POA: Diagnosis present

## 2022-10-12 NOTE — Progress Notes (Signed)
SUBJECTIVE:   Kayla Garza is a 67 year old female who complains of pain radiating from left shoulder to her arm with numbness worse on the left D2-3 coming digits x1 month.  Patient is feeling better, pain level is down to 7/10. She started PT, 2 sessions so far and saw a chiropractor.  She still continues with radiation down her arm and numbness in left the 2, 3.  Began acutely without injury; no neck pain.  Constant numbness and tingling in the fingers.  Pain is constant, no specific aggravating factors.  Was seen in the ED on 3/ 9 and prescribed Naprosyn which had no effect.  Currently not physically active but continues working.    Quantity: 9/10 in intensity    PT: No  Pain Meds: Naprosyn without effect  Psychiatric:     Past Pain injections: No    Work: Engineer, water  Social: Lives with husband    Imaging: None      REVIEW OF SYSTEMS:  All systems reviewed and are negative except as stated in the HPI.    PAST MEDICAL HISTORY:  Past Medical History:  05/25/2012: Allergic conjunctivitis  No date: Breast lump      Comment:  1/00 Bx > fibroadenoma  2000: Helicobacter pylori (H. 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 7/01 Pap of  vag cuff                Negative; ovaries remain  No date: REDUCTION MAMMAPLASTY  1994: ROOT CANAL    FAMILY HISTORY:  Review of patient's family history indicates:  Problem: Osteoporosis      Relation: Mother          Age of Onset: (Not Specified)  Problem:  Pulmonary      Relation: Mother          Age of Onset: (Not Specified)          Comment: COPD? smoker  Problem: VASCULAR      Relation: Father          Age of Onset: (Not Specified)          Comment: ? DVT  Problem: Psychiatric Illness      Relation: Brother          Age of Onset: (Not Specified)          Comment: depression  Problem: Cancer - Breast      Relation: FamHxNeg          Age of Onset: (Not Specified)  Problem: Cancer - Colon      Relation: FamHxNeg          Age of Onset: (Not Specified)  Problem: Diabetes      Relation: FamHxNeg          Age of Onset: (Not Specified)  Problem: Heart      Relation: FamHxNeg  Age of Onset: (Not Specified)      SOCIAL HISTORY:  Social History     Socioeconomic History    Marital status: Married     Spouse name: Not on file    Number of children: 0    Years of education: Not on file    Highest education level: Not on file   Occupational History     Employer: [employer]   Tobacco Use    Smoking status: Never    Smokeless tobacco: Never   Substance and Sexual Activity    Alcohol use: No    Drug use: No    Sexual activity: Yes     Partners: Male   Other Topics Concern    Military Service Not Asked    Blood Transfusions Not Asked    Caffeine Concern Not Asked    Occupational Exposure Not Asked    Hobby Hazards Not Asked    Sleep Concern Not Asked    Stress Concern No    Weight Concern Not Asked    Special Diet Not Asked    Back Care Not Asked    Exercise Yes    Bike Helmet Not Asked    Seat Belt Not Asked    Self-Exams Not Asked   Social History Narrative    Works with Public house manager, lives with husband, feels safe    From [place]    Arrived [place] 1985    No children        [name], MD, 02/27/2018    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  (retired)    No children           Social Determinants of Catering manager Strain: Not on file  Food Insecurity: Not on file  Transportation Needs: Not on file  Physical Activity: Not on file  Stress: Not on file  Social Connections: Not on file  Intimate Partner Violence: Not on file  Housing Stability: Not on file    ALLERGIES:  Review of Patient's Allergies indicates:  No Known Allergies    CURRENT MEDICATIONS:  diclofenac (VOLTAREN) 1 % GEL Gel, Apply 2 g topically in the morning and 2 g at noon and 2 g in the evening and 2 g before bedtime., Disp: 100 g, Rfl: 0  cyclobenzaprine (FLEXERIL) 5 MG tablet, Take 1 tablet by mouth 2 (two) times daily as needed, Disp: 60 tablet, Rfl: 2  naproxen (NAPROSYN) 500 MG tablet, Take 1 tablet by mouth every 12 (twelve) hours as needed  for up to 7 days, Disp: 14 tablet, Rfl: 0  calcium carbonate + vitamin D (CALTRATE 600+D PLUS) 600-800 MG-UNIT TABS per tablet, Take 2 tablets by mouth in the morning., Disp: 60 tablet, Rfl: 11    No current facility-administered medications on file prior to visit.      OBJECTIVE:  There were no vitals taken for this visit.    General: normal gait, NAD  Psych: AxO x3, normal affect.  HEENT: normocephalic/atraumatic  CV: warm extremitites            LABS REVIEWED:  HEMOGLOBIN A1C   Date Value Ref Range  Status   03/22/2022 5.9 (H) 4.0 - 5.6 % Final     Comment:     Hemoglobin A1C         Interpretive information    5.7 - 6.4 %            Increased risk for diabetes;                         recommend lifestyle management        > 6.4 %            Diagnosis of diabetes; should have at                         least 2 elevated results for                         diagnosis of diabetes.        > 8.0 %            Action suggested        < 7.0 %            Goal of therapy for diabetics; higher                         targets may apply for some with                         specific comorbidities.     02/12/2021 5.8 (H) 4.0 - 5.6 % Final      Comment:     Hemoglobin A1C         Interpretive information    5.7 - 6.4 %            Increased risk for diabetes;                         recommend lifestyle management        > 6.4 %            Diagnosis of diabetes; should have at                         least 2 elevated results for                         diagnosis of diabetes.        > 8.0 %            Action suggested        < 7.0 %            Goal of therapy for diabetics; higher                         targets may apply for some with                         specific comorbidities.     05/29/2020 5.7 (H) 4.0 - 5.6 % Final     Comment:     Hemoglobin A1C         Interpretive information    5.7 - 6.4 %            Increased risk for diabetes;  recommend lifestyle management        > 6.4 %            Diagnosis of diabetes; should have at                         least 2 elevated results for                         diagnosis of diabetes.        > 8.0 %            Action suggested        < 7.0 %            Goal of therapy for diabetics; higher                         targets may apply for some with                         specific comorbidities.       WHITE BLOOD CELL COUNT   Date Value Ref Range Status   09/20/2016 5.1 4.0 - 11.0 TH/uL Final   05/07/2015 5.4 4.0 - 11.0 TH/uL Final     BUN (UREA NITROGEN)   Date Value Ref Range Status   05/11/2018 15 7 - 18 mg/dL Final   16/03/9603 17 6 - 20 mg/dl Final     CREATININE   Date Value Ref Range Status   05/11/2018 0.8 0.4 - 1.2 mg/dL Final   54/02/8118 0.7 0.4 - 1.2 mg/dl Final     ALANINE AMINOTRANSFERASE   Date Value Ref Range Status   05/11/2018 26 12 - 45 U/L Final   08/17/2012 24 7 - 35 IU/L Final     ASPARTATE AMINOTRANSFERASE   Date Value Ref Range Status   05/11/2018 19 8 - 34 U/L Final   08/17/2012 25 8 - 34 IU/L Final     TSH (THYROID STIM HORMONE)   Date Value Ref Range Status   05/17/2017 2.080 0.358 - 3.740 uIU/mL Final          ASSESSMENT:   Acute left cervical radiculopathy, left  C6-7 neuroforaminal narrowing.    Images reviewed: Cervical MRI 09/2022  Diffuse mild to moderate degenerative changes, grade 1 C5-6 spondylolisthesis, left C6-7 neuroforaminal stenosis.      PLAN:  Diagnosis, and treatment options were discussed, including CESI, not interested at this time  -Continue PT; feeling better after chiropractic treatment  - C MRI images reviewed and discussed with patient  -Medication management: Did not tolerate Flexeril 5 mg , tramadol due to sedation    f/u TV 2 months

## 2022-10-14 ENCOUNTER — Other Ambulatory Visit: Payer: Self-pay

## 2022-10-14 ENCOUNTER — Ambulatory Visit: Payer: Exclusive Provider Organization | Attending: Physical Medicine & Rehabilitation

## 2022-10-14 DIAGNOSIS — M542 Cervicalgia: Secondary | ICD-10-CM | POA: Diagnosis present

## 2022-10-14 DIAGNOSIS — R200 Anesthesia of skin: Secondary | ICD-10-CM | POA: Diagnosis present

## 2022-10-14 DIAGNOSIS — M79602 Pain in left arm: Secondary | ICD-10-CM | POA: Diagnosis present

## 2022-10-14 DIAGNOSIS — M5412 Radiculopathy, cervical region: Secondary | ICD-10-CM | POA: Diagnosis present

## 2022-10-14 NOTE — Progress Notes (Signed)
Outpatient PT Follow-up  [facility]: [place]    Subjective:   Feeling better but still having pain. Dr gave her three options- injection, PT, or surgery  Went to chiropractor which has been helpful.    Objective:   10/14/22 1600   Language Information   Language Needs Met By: Lenox Ponds Used Effectively By Patient   Precautions   Precautions Yes   Other   Other (Comments) vit D deficiency; cushing's   Rehab Discipline   Rehab Discipline PT   Visit   Visit number 3   POC Due date 10/16/22   Time Calculation   Start Time 1635   Stop Time 1700   Time Calculation (min) 25 min   Manual Therapy   Technique STM sclenes, SCM   Time in minutes 5'   Manual Therapy 2   Technique cx traction   Time in minutes 5'   Ther Exercise   Exercise supine chin tucks   Reps 15   Ther Exercise 2   Exercise seated scap retraction   Reps 2 15   Ther Exercise 3   Exercise 3 supine chin tuck + ER   Reps 3 5   Holds 3 YTB   Patient Education   What was taught? HEP cont   Method Verbal;Demo;Practice;Written   Patient comprehension Yes       Assessment:  Increased TTP along scalenes and increased ms tissue density B scalenes- gentle STM to address this. Good carryover of chin tucks. Added banded ER with cues to relax upper traps. Progressed to seated scap retractions with good cx position.     Plan:  Recert; pec stretch      Raynaldo Opitz, PT, Lic # [id number]

## 2022-10-20 ENCOUNTER — Other Ambulatory Visit: Payer: Self-pay

## 2022-10-20 ENCOUNTER — Ambulatory Visit (HOSPITAL_BASED_OUTPATIENT_CLINIC_OR_DEPARTMENT_OTHER): Payer: Exclusive Provider Organization

## 2022-10-20 DIAGNOSIS — R200 Anesthesia of skin: Secondary | ICD-10-CM | POA: Insufficient documentation

## 2022-10-20 DIAGNOSIS — M542 Cervicalgia: Secondary | ICD-10-CM | POA: Insufficient documentation

## 2022-10-20 DIAGNOSIS — M79602 Pain in left arm: Secondary | ICD-10-CM | POA: Insufficient documentation

## 2022-10-20 DIAGNOSIS — M5412 Radiculopathy, cervical region: Secondary | ICD-10-CM | POA: Insufficient documentation

## 2022-10-20 LAB — SURGICAL PATH SPECIMEN DERM CLINIC

## 2022-10-20 NOTE — Progress Notes (Signed)
Outpatient PT Re-Certification  [facility]: [place]    ICD-10: Neck pain, acute  (primary encounter diagnosis); (M79.602,  R20.0) Pain and numbness of left upper extremity    Subjective:   Exercises from last week caused soreness  Overall is better than the first week- pain has centralized to the shoulders and neck; no longer feeling heavy/weak  Fingers: still a little bit of tingling in the tip of the fingers    Work: back to work June 24  ADLs: sometimes pain with cooking; doing a little cleaning; ADLs ok  Sleeping: some nights still wakes up but is less often    Objective:  Alignment/Posture/Observation  Rounded shoulders, forward head, slouched     Test of Function   UE AROM - WFL     Palpation  TTP medial to L scap  Increased ms tissue density B UT (L>R))     Neuro Screen:  Sensation: decreased sensation 2nd and 3rd tips     Cervical Screen:    Motion Symptoms   Flexion wfl Pain upper thoracic   Extension wfl Little bit of pain   L side bend wfl Pulling pain left side neck    R side bend wfl ok   L rotation wfl Pain in post L shoulder   R rotation wfl Pain in post L shoulder            Muscle Strength    L R   SHOULDER   flexion 4+/5 4+/5   extension       abduction 4/5 little pain in the arm 5/5   ER 4/5 5/5   IR 5/5 5/5   SCAPULAE   rhomboid/MT       upper trap       serratus anterior       lower trap       ELBOW   flexion       extension       pronation       supination       WRIST   flexion       extension       HAND   grip 53lb 65lb   digits 2-5       thumb             Joint mobility  Decreased thoracic mobility    Assessment:  Pain has centralized to her neck and shoulder. She continues to have mild radicular symptoms in tips of fingers though this has improved significantly. Continues to demonstrate poor movement control impairment and stability in cx spine and decreased scapular strength. Recommended she try another week of current therex to determine if her soreness was ms soreness or  pain.   Pt will continue to benefit from skilled PT once a week for 8 more weeks to address above impairments and functional limitations with ADLs, lifting, reaching.    Plan:  Pec stretch; rows    Treatment Plan: ** PT Re-Eval (CPT 97164)  ** Stretching/ROM/Therapeutic Exercise (97110)  ** Self-Care/Home Management Training (CPT [phone])  ** Neuromuscular Re-education (CPT [id number])  ** Manual Therapy / Joint / Soft tissue Mobilization (CPT 97140)  ** Hot/Cold Rx (CPT 97010)  ** Functional Activities (CPT 97530)    Goals:  Short Term Goals: (4 weeks)  - Pt will be independent with HEP -MET  - Pt will improve pain with cx AROM -MET  - Pt will demonstrate improved postural awareness -MET  - Pt will demonstrate centralizing or decreased  intensity of radicular symptoms -MET  - Pt will have 10lb improvement in grip strength -MET     Long Term Goals: (10 weeks)  - Pt will demonstrate 5/5 MMT throughout UE  - Pt will demonstrate increased motor control during functional tasks  - Pt will demonstrate cx ROM WNL with min-to-no pain  - Pt will continue to be independent with HEP  - Pt will be able to move arm in the morning with minimal to no pain  - Pt will be able to work with min to no pain  - Pt will be able to complete household tasks with min to no pain  - Pt will experience little to no radicular symptoms     Raynaldo Opitz, PT, Lic # [id number]

## 2022-10-21 DIAGNOSIS — R200 Anesthesia of skin: Secondary | ICD-10-CM | POA: Diagnosis present

## 2022-10-21 DIAGNOSIS — M79602 Pain in left arm: Secondary | ICD-10-CM | POA: Diagnosis present

## 2022-10-21 DIAGNOSIS — M542 Cervicalgia: Secondary | ICD-10-CM | POA: Diagnosis present

## 2022-10-21 DIAGNOSIS — M5412 Radiculopathy, cervical region: Secondary | ICD-10-CM | POA: Diagnosis present

## 2022-10-21 NOTE — Progress Notes (Signed)
10/20/22 1600   Language Information   Language Needs Met By: Lenox Ponds Used Effectively By Patient   Precautions   Precautions Yes   Other   Other (Comments) vit D deficiency; cushing's   Rehab Discipline   Rehab Discipline PT   Visit   Visit number 4   POC Due date 11/19/22   Time Calculation   Start Time 1620   Stop Time 1645   Time Calculation (min) 25 min   Patient Education   What was taught? HEP cont   Method Verbal;Demo;Practice   Patient comprehension Yes       Raynaldo Opitz, PT, Lic # [id number]

## 2022-10-22 ENCOUNTER — Other Ambulatory Visit: Payer: Self-pay

## 2022-10-22 ENCOUNTER — Ambulatory Visit: Payer: Exclusive Provider Organization | Attending: Dermatology | Admitting: Registered Nurse

## 2022-10-22 DIAGNOSIS — Z4802 Encounter for removal of sutures: Secondary | ICD-10-CM | POA: Diagnosis present

## 2022-10-22 NOTE — Progress Notes (Signed)
Patient presents for suture removal left breast. The wound is well healed without signs of infection.  The sutures are removed. Return prn.

## 2022-10-27 ENCOUNTER — Ambulatory Visit (HOSPITAL_BASED_OUTPATIENT_CLINIC_OR_DEPARTMENT_OTHER): Payer: Exclusive Provider Organization

## 2022-10-27 ENCOUNTER — Other Ambulatory Visit: Payer: Self-pay

## 2022-10-27 DIAGNOSIS — M79602 Pain in left arm: Secondary | ICD-10-CM

## 2022-10-27 DIAGNOSIS — R200 Anesthesia of skin: Secondary | ICD-10-CM | POA: Diagnosis present

## 2022-10-27 DIAGNOSIS — M542 Cervicalgia: Secondary | ICD-10-CM

## 2022-10-27 DIAGNOSIS — M5412 Radiculopathy, cervical region: Secondary | ICD-10-CM | POA: Diagnosis present

## 2022-10-27 NOTE — Progress Notes (Signed)
Outpatient PT Follow-up  [facility]: [place]    Subjective:   Feeling so-so. Had sig pain the other day and today.    Objective:   10/27/22 1600   Language Information   Language Needs Met By: Lenox Ponds Used Effectively By Patient   Precautions   Precautions Yes   Other   Other (Comments) vit D deficiency; cushing's   Rehab Discipline   Rehab Discipline PT   Visit   Visit number 5   POC Due date 11/19/22   Time Calculation   Start Time 1630   Stop Time 1700   Time Calculation (min) 30 min   Manual Therapy 3   Technique gentle thoracic mob   Time in minutes 10'   Ther Exercise   Exercise wall slide- seated   Reps 15   Ther Exercise 2   Exercise rows   Reps 2 30   Ther Exercise 3   Exercise 3 standing chin tucks + ER   Reps 3 20   Holds 3 YTB   Ther Exercise 4   Exercise 4 doorway pec   Holds 4 30s   Patient Education   What was taught? HEP cont   Method Verbal;Demo;Practice;Written   Patient comprehension Yes       Assessment:  Sig TTP upper thoracic mob with wedge so did gentle manual perispinal mob. Progressed to resisted rows and B ER for scap/RTC strength. Seated wall slide for thoracic extension mobility. Doorway pec for anterior mobility    Plan:  SAPD; horiz abd        Raynaldo Opitz, PT, Lic # [id number]

## 2022-11-02 ENCOUNTER — Telehealth (HOSPITAL_BASED_OUTPATIENT_CLINIC_OR_DEPARTMENT_OTHER): Payer: Self-pay | Admitting: Dermatology

## 2022-11-02 MED ORDER — TRIAMCINOLONE ACETONIDE 0.1 % EX OINT
TOPICAL_OINTMENT | Freq: Two times a day (BID) | CUTANEOUS | 0 refills | Status: AC
Start: 2022-11-02 — End: 2022-12-02

## 2022-11-02 NOTE — Telephone Encounter (Signed)
Telephone Call regarding culture/labs/biopsy.  ?Patient informed of results shown below.  ??  I spoke to the patient directly and discussed the significance of the diagnosis in detail. All of the patient's questions were answered.      Informed her that her biopsy results were not specific. Discussed we can try a topical steroid for now, if the rash recurs we can consider another biopsy.   Will start triamcinolone 0.1% ointment BID, 2 weeks on, 2 weeks off. Maximum use of two weeks per month. Steroid side effects discussed including skin atrophy, fragility, telangiectasias, striae, pigmentary changes.     ?  ?Maudry Mayhew, MD   [facility]   ?  ?  Results:  SPEC #: [id number]        RECD: 10/08/22-2255  STATUS: SOUT  SP TYPE: DERM            COLL: 10/08/22-  SUB DR: Marlowe Shores MD     ENTERED:  10/08/22-2255     ORDERED:  09811, 91478, 88341SOTC/3, 88342SOTC, HE, L1, L2, L3, PAS-Fu     Performed at [facility]  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     Note: No fungal organisms are identified with a PAS stain performed at the  outside institution and reviewed at [facility]. Additional immunostains performed  at [facility] reveal that the majority of the immune cell infiltrate consists of  small CD3+ T cells without epidermal tagging. Scattered small admixed CD20+  B cells are present. CD1a highlights scattered intraepidermal Langerhans  cells and intradermal Langerhans cells/dendritic cells. A CD30 immunostain  highlights only rare dermal cells.     The findings are not entirely specific but favor a hypersensitivity  reaction  or possibly eczematous dermatitis in the appropriate context.  Psoriasis is less favored. Typical histologic features of a  lymphoproliferative disorder are not seen. Clinical correlation is needed.     After review of the entire case, Lyn McDivitt Para March MD of [facility] has rendered the above diagnosis, with which I concur.     Diagnosis by: Avie Arenas, MD     CLINICAL HISTORY     Preop diagnosis:  LYP VS PLEVA VS PAPULAR URTICARIA VS HYPERSENSITIVITY     Surgical procedure:

## 2022-11-04 ENCOUNTER — Ambulatory Visit (HOSPITAL_BASED_OUTPATIENT_CLINIC_OR_DEPARTMENT_OTHER): Payer: Exclusive Provider Organization

## 2022-11-04 ENCOUNTER — Other Ambulatory Visit: Payer: Self-pay

## 2022-11-04 DIAGNOSIS — R200 Anesthesia of skin: Secondary | ICD-10-CM

## 2022-11-04 DIAGNOSIS — M5412 Radiculopathy, cervical region: Secondary | ICD-10-CM | POA: Diagnosis not present

## 2022-11-04 DIAGNOSIS — M542 Cervicalgia: Secondary | ICD-10-CM

## 2022-11-04 NOTE — Progress Notes (Signed)
Outpatient PT Follow-up  [facility]: [place]    Subjective:   No pain today. Felt sore in low back from exercises.    Objective:   11/04/22 1600   Language Information   Language Needs Met By: Lenox Ponds Used Effectively By Patient   Precautions   Precautions Yes   Other   Other (Comments) vit D deficiency; cushing's   Rehab Discipline   Rehab Discipline PT   Visit   Visit number 6   POC Due date 11/19/22   Time Calculation   Start Time 1600   Stop Time 1630   Time Calculation (min) 30 min   Manual Therapy 3   Technique throacic mob with wedge   Time in minutes 8'   Ther Exercise   Exercise wall slide- seated   Reps 15   Ther Exercise 2   Exercise rows   Reps 2 15   Sets 2 2   Holds 2 7lb   Ther Exercise 4   Exercise 4 doorway pec   Holds 4 30s   Ther Exercise 5   Exercise 5 horiz abd + chin tuck   Reps 5 10   Holds 5 YTB   Patient Education   What was taught? HEP cont   Method Verbal;Demo;Practice;Written   Patient comprehension Yes       Assessment:  Good carryover of therex. Cavitations with gentle G2 thoracic mob. Added horiz abd with cx retraction for home.    Plan:  Er/IR strength      Raynaldo Opitz, PT, Lic # [id number]

## 2022-11-05 ENCOUNTER — Telehealth (HOSPITAL_BASED_OUTPATIENT_CLINIC_OR_DEPARTMENT_OTHER): Payer: Self-pay | Admitting: Internal Medicine

## 2022-11-05 NOTE — Telephone Encounter (Signed)
Type of form ex: disability, immunizations, RMV, ZOX:WRUEA term disability  How form was received  (fax/drop off): drop off  Where was form left (folder/providers desk):folder  When is form VWU:JWJX  Contact number:[phone]  Mail/pick up/fax:pick up  copy of original one needs it done over  Inform patient that form may take 7-14 days to complete

## 2022-11-08 ENCOUNTER — Encounter (HOSPITAL_BASED_OUTPATIENT_CLINIC_OR_DEPARTMENT_OTHER): Payer: Self-pay | Admitting: Internal Medicine

## 2022-11-08 ENCOUNTER — Ambulatory Visit: Payer: Exclusive Provider Organization | Attending: Internal Medicine | Admitting: Internal Medicine

## 2022-11-08 ENCOUNTER — Other Ambulatory Visit: Payer: Self-pay

## 2022-11-08 VITALS — BP 142/88 | HR 81 | Temp 98.3°F | Wt 158.0 lb

## 2022-11-08 DIAGNOSIS — M79602 Pain in left arm: Secondary | ICD-10-CM | POA: Diagnosis present

## 2022-11-08 DIAGNOSIS — R200 Anesthesia of skin: Secondary | ICD-10-CM | POA: Insufficient documentation

## 2022-11-08 DIAGNOSIS — M4802 Spinal stenosis, cervical region: Secondary | ICD-10-CM | POA: Diagnosis present

## 2022-11-08 NOTE — Progress Notes (Unsigned)
Kayla Garza is a 67 year old female, [place], I last saw in April/24    FU cervical pain  Has paperwork from work that needs to be updated    Continues doing PT, and she notices progress on her sx    Cervical MRI showed + cervical stenosis    At last visit with Dr Leighton Roach, (09/06/22) he noted:  Acute left cervical radiculopathy, left C6-7 neuroforaminal narrowing.     Images reviewed: Cervical MRI 09/2022  Diffuse mild to moderate degenerative changes, grade 1 C5-6 spondylolisthesis, left C6-7 neuroforaminal stenosis.       PLAN:  Diagnosis, and treatment options were discussed, including CESI, not interested at this time  -Continue PT; feeling better after chiropractic treatment  - C MRI images reviewed and discussed with patient  -Medication management: Did not tolerate Flexeril 5 mg , tramadol due to sedation      #) She had many questions about last visit with Dr Leighton Roach, dx and plan were not clear to her.    OBJECTIVE:  BP 142/88 (Site: LA, Position: Sitting, Cuff Size: Reg)   Pulse 81   Temp 98.3 F (36.8 C) (Temporal)   Wt 71.7 kg (158 lb)   SpO2 99%   BMI 30.62 kg/m   Pleasant, in NAD    PE: deferred    ASSESSMENT & PLAN:  (M48.02) Cervical stenosis of spine  (primary encounter diagnosis)  (M79.602,  R20.0) Pain and numbness of left upper extremity  Comment: we reviewed recent MRI results, and I interpreted them, as well as Dr Leighton Roach recommendations to the best of my knowledge. Pt was thankful for this  Plan: Forms for work filled out. Copy left to be scanned.   Plan to return to work after completing PT and next appt with Dr Leighton Roach on 6/18. She will need a clearance to return to work. We'll schedule it for 6/20 with an intent to return to work 6/22.  Pt encouraged to discuss with PT and Dr Leighton Roach recommendations for possible work accommodations to prevent further injuries, so they can be included in clearance letter.    The patient was ready to learn and no  apparent learning or adherence barriers were identified. I explained the diagnosis and treatment plan, and the patient expressed understanding of the content. I attempted to answer any questions regarding the diagnosis and the proposed treatment.    We discussed the patient's current medications.  We discussed the importance of medication compliance. The patient expressed understanding and no barriers to adherence were identified.    follow-up 6/20 or prn  she has been advised to call or return with any worsening or new problems

## 2022-11-09 DIAGNOSIS — M4802 Spinal stenosis, cervical region: Secondary | ICD-10-CM | POA: Insufficient documentation

## 2022-11-12 ENCOUNTER — Other Ambulatory Visit: Payer: Self-pay

## 2022-11-12 ENCOUNTER — Ambulatory Visit: Payer: Exclusive Provider Organization

## 2022-11-12 DIAGNOSIS — M5412 Radiculopathy, cervical region: Secondary | ICD-10-CM | POA: Diagnosis present

## 2022-11-12 NOTE — Progress Notes (Signed)
Orthopedic Hand Surgery Note    Interpreter:   T[place]    PCP: Irine Seal, APRN  Hand Dominance: Right-hand-dominant  Date of injury: Several months    CC: Left arm pain radiating into the hand  DX: Left cervical radiculopathy, C7 distribution    HPI: This is a 67 year old female who presents to our office today for a referral from [facility], in regards to left hand numbness.  Patient is currently being worked up for left cervical radiculopathy, that travels into her index and middle fingers in the C7 distribution.  This correlates with her recent MRI findings.  Today, she reports fairly constant numbness and pain in the left index and middle finger.  She has a decrease in sensation.  She states that it is difficult for her to use her left arm and feels as though her hand is weak.  She does report pain numbness and tingling down her entire left arm with associated cervical neck pain.  No recent trauma.  No redness or warmth of her left upper extremity.  Denies any fevers.    Occupation: Pressor, cleaning services    Review of Systems:  Gen- Negative for fevers, chills, recent illnesses, night sweats, weight loss.  Skin- Negative for rashes, hair loss  CV- Negative for chest pain, palpatations.  MSK- See HPI for pertinent positives.  Neuro - Negative for seizures, headache, weakness, dizziness, tremor, confusion    Allergies and medications: Reviewed and updated in Epic.    Current Outpatient Medications:     triamcinolone (KENALOG) 0.1 % ointment, Apply topically 2 (two) times daily Use two weeks on, two weeks off. Up to two weeks per month. Do not apply on the face, groin, or armpits., Disp: 80 g, Rfl: 0    cyclobenzaprine (FLEXERIL) 5 MG tablet, Take 1 tablet by mouth 2 (two) times daily as needed, Disp: 60 tablet, Rfl: 2    naproxen (NAPROSYN) 500 MG tablet, Take 1 tablet by mouth every 12 (twelve) hours as needed  for up to 7 days, Disp: 14 tablet, Rfl: 0    calcium carbonate + vitamin D (CALTRATE 600+D  PLUS) 600-800 MG-UNIT TABS per tablet, Take 2 tablets by mouth in the morning., Disp: 60 tablet, Rfl: 11  Review of Patient's Allergies indicates:  No Known Allergies    PMHx: Past Medical History:  05/25/2012: Allergic conjunctivitis  No date: Breast lump      Comment:  1/00 Bx > fibroadenoma  2000: Helicobacter pylori (H. 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 7/01 Pap of  vag cuff                Negative; ovaries remain  No date: REDUCTION MAMMAPLASTY  1994: ROOT CANAL    Family Hx: Review of patient's family history indicates:  Problem: Osteoporosis      Relation: Mother  Age of Onset: (Not Specified)  Problem: Pulmonary      Relation: Mother          Age of Onset: (Not Specified)          Comment: COPD? smoker  Problem: VASCULAR      Relation: Father          Age of Onset: (Not Specified)          Comment: ? DVT  Problem: Psychiatric Illness      Relation: Brother          Age of Onset: (Not Specified)          Comment: depression  Problem: Cancer - Breast      Relation: FamHxNeg          Age of Onset: (Not Specified)  Problem: Cancer - Colon      Relation: FamHxNeg          Age of Onset: (Not Specified)  Problem: Diabetes      Relation: FamHxNeg          Age of Onset: (Not Specified)  Problem: Heart      Relation: FamHxNeg          Age of Onset: (Not Specified)      Social Hx:   Social History     Socioeconomic History    Marital status: Married     Spouse name: Not on file    Number of  children: 0    Years of education: Not on file    Highest education level: Not on file   Occupational History     Employer: [employer]   Tobacco Use    Smoking status: Never    Smokeless tobacco: Never   Substance and Sexual Activity    Alcohol use: No    Drug use: No    Sexual activity: Yes     Partners: Male   Other Topics Concern    Military Service Not Asked    Blood Transfusions Not Asked    Caffeine Concern Not Asked    Occupational Exposure Not Asked    Hobby Hazards Not Asked    Sleep Concern Not Asked    Stress Concern No    Weight Concern Not Asked    Special Diet Not Asked    Back Care Not Asked    Exercise Yes    Bike Helmet Not Asked    Seat Belt Not Asked    Self-Exams Not Asked   Social History Narrative    Works with Public house manager, lives with husband, feels safe    From [place]    Arrived [place] 1985    No children        [name], MD, 02/27/2018    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)    No children           Social Determinants of Catering manager Strain: Not on file  Food Insecurity: Not on file  Transportation Needs: Not on file  Physical Activity: Not on file  Stress: Not on file  Social Connections: Not on file  Intimate Partner Violence:  Not on file  Housing Stability: Not on file    Physical exam:  General/neuro: Alert and oriented, no acute distress, cooperative  HEENT: PERRLA, EOM intact, sclerae anicteric  Neck: no noticeable or palpable swelling, redness or rash around throat or on face  Cardiac: RRR  Pulm: breathing comfortably on room air  Skin: warm and dry without rashes, lesions or abrasions  Musculoskeletal: Left hand and Wrist exam:   No rotational deformities noted.   Skin is warm, pink, dry and intact.   No thenar atrophy or  interossei atrophy  There is no erythema, edema, or ecchymosis. No sign of infection.  No tenderness around the carpal rows or snuffbox.  Motor function intact to EPL, FPL, FDS/FDP to all digits, interossei, APB, EDC to all digits.  Able to make a composite fist.  Decrease in sensation along the dorsal hand at the index and middle fingers  No pain with passive or active ROM with digits 1-5.   Neurovascularly intact distally. +2 radial pulses.  Digits well perfused, capillary refill <2 sec at each digit.   Sensation intact in the median, radial, and ulnar nerve distributions.    Special tests:   Negative Phalens exam, Negative Tinels exam at the elbow, negative at the wrist.  Negative Durkans test     Cervical spine:   No sign of curvature deformity noted  No sign of redness or warmth, no skin rashes or lesions of the cervical spine region.  Patient has stiffness with forward flexion, no pain with extension  No pain with lateral bending bilaterally  No pain to palpate midline along the cervical spine. Mild TTP cervical left trapezius area.   + Spurling exam down the Left arm.   Bilateral 2+ distal pulses      Imaging: MRI of the cervical spine reviewed: Mild multilevel degenerative disc disease with small central disc bulge and disc osteophyte and neural foraminal disc bulge resulting in mild central canal narrowing and mild neural foraminal narrowing as detailed above. There is a left neural foraminal disc herniation at C6-C7 with moderate to severe left neural foraminal narrowing   Radiographs were visualized and interpreted by both myself and Dr. Danella Deis.     Assessment/plan: This is a pleasant 67 year old female who was referred for left hand numbness for the past several months.  She currently is seeing the physiatry team and being worked up for left-sided cervical radiculopathy.  Exam today does not show any sign of carpal tunnel or cubital tunnel syndrome but rather symptoms relating to cervical radiculopathy  primarily in the C7 to nerve distribution.  Our recommendation is to follow-up with Dr. Leighton Roach for her appointment on June 18th.   Patient verbalized understanding.  She will follow-up with orthopedics as needed    Patient was seen, discussed and examined with Dr. Danella Deis who formulated the assessment and plan. Please see the Attending's dictated note as well.  This note was populated using a Office manager system. Please excuse all possible errors.   Alfonzo Feller, PA-C, 11/12/2022

## 2022-11-17 NOTE — Progress Notes (Signed)
Please see PA Barney's note for full details. Briefly, this is a 67 year old female who presents with left C7 radiculopathy. She is being treated by Dr. Leighton Roach for this. Exam of the LUE demonstrates diminished sensation in the C7 distribution and negative provocative carpal tunnel tests. MRI c spine demonstrates C6-7 left foraminal disc herniation with severe narrowing. Recommend continued treatment for cervical radiculopathy, no hand intervention warranted at this time. F/u PRN.    All questions answered today. I provided a substantive portion of the care of this patient. I personally performed the history, exam and medical decision making for this encounter.    Lorre Nick MD

## 2022-11-18 ENCOUNTER — Other Ambulatory Visit: Payer: Self-pay

## 2022-11-18 ENCOUNTER — Ambulatory Visit: Payer: Exclusive Provider Organization | Attending: Physical Medicine & Rehabilitation

## 2022-11-18 DIAGNOSIS — M79602 Pain in left arm: Secondary | ICD-10-CM

## 2022-11-18 DIAGNOSIS — M5412 Radiculopathy, cervical region: Secondary | ICD-10-CM | POA: Diagnosis present

## 2022-11-18 DIAGNOSIS — R200 Anesthesia of skin: Secondary | ICD-10-CM | POA: Diagnosis present

## 2022-11-18 DIAGNOSIS — M542 Cervicalgia: Secondary | ICD-10-CM | POA: Diagnosis present

## 2022-11-18 NOTE — Progress Notes (Signed)
Outpatient PT Follow-up  [facility]: [place]    Subjective:   Has pain next day if she does a lot or work    Objective:   11/18/22 1500   Language Information   Language Needs Met By: Lenox Ponds Used Effectively By Patient   Precautions   Precautions Yes   Other   Other (Comments) vit D deficiency; cushing's   Rehab Discipline   Rehab Discipline PT   Visit   Visit number 7   POC Due date 11/19/22   Time Calculation   Start Time 1555   Stop Time 1620   Time Calculation (min) 25 min   Ther Exercise   Exercise wall slide- seated   Reps 15   Ther Exercise 2   Exercise rows   Reps 2 15   Sets 2 2   Holds 2 RTB   Ther Exercise 3   Exercise 3 band ER/IR   Reps 3 15   Sets 3 2   Holds 3 YTB   Ther Exercise 5   Exercise 5 horiz abd + chin tuck   Reps 5 15   Sets 5 2   Holds 5 RTB   Patient Education   What was taught? HEP cont   Method Verbal;Demo;Practice;Written   Patient comprehension Yes       Assessment:  Rows seated to minimize swaying despite cues. Added banded ER/IR today for RTC strength. Progressed to RTB for rows and horiz abd.    Plan:  Recert or dc      Raynaldo Opitz, PT, Lic # [id number]

## 2022-11-25 ENCOUNTER — Other Ambulatory Visit: Payer: Self-pay

## 2022-11-25 ENCOUNTER — Ambulatory Visit: Payer: Exclusive Provider Organization | Attending: Physical Medicine & Rehabilitation

## 2022-11-25 DIAGNOSIS — M79602 Pain in left arm: Secondary | ICD-10-CM | POA: Diagnosis present

## 2022-11-25 DIAGNOSIS — M542 Cervicalgia: Secondary | ICD-10-CM | POA: Insufficient documentation

## 2022-11-25 DIAGNOSIS — M5412 Radiculopathy, cervical region: Secondary | ICD-10-CM | POA: Diagnosis present

## 2022-11-25 DIAGNOSIS — R200 Anesthesia of skin: Secondary | ICD-10-CM | POA: Diagnosis present

## 2022-11-25 NOTE — Progress Notes (Signed)
[facility] Discharge Report      Diagnosis: Neck pain, acute [M54.2]   Referring Provider: Orinda Kenner, MD     This patient is being discharged today after 8 visits    Reason for discharge:  Pt has progressed well and has improved in strength, ROM, functional mobility. Reports no more pain or radicular symptoms. Has pain at wrist that appears to be unrelated to the neck. Recommended asking for referral for OT.    Goals:  Short Term Goals: (4 weeks)  - Pt will be independent with HEP -MET  - Pt will improve pain with cx AROM -MET  - Pt will demonstrate improved postural awareness -MET  - Pt will demonstrate centralizing or decreased intensity of radicular symptoms -MET  - Pt will have 10lb improvement in grip strength -MET     Long Term Goals: (10 weeks)  - Pt will demonstrate 5/5 MMT throughout UE  - Pt will demonstrate increased motor control during functional tasks -MET  - Pt will demonstrate cx ROM WNL with min-to-no pain -MET  - Pt will continue to be independent with HEP -MET  - Pt will be able to move arm in the morning with minimal to no pain -MET  - Pt will be able to work with min to no pain -MET  - Pt will be able to complete household tasks with min to no pain -MET  - Pt will experience little to no radicular symptoms  -MET       Recommendations:  Cont HEP; fu with doctor regarding hand pain    Flowsheet   11/25/22 1600   Language Information   Language Needs Met By: Lenox Ponds Used Effectively By Patient   Precautions   Precautions Yes   Other   Other (Comments) vit D deficiency; cushing's   Rehab Discipline   Rehab Discipline PT   Visit   Visit number 8   POC Due date d/c   Time Calculation   Start Time 1605   Stop Time 1615   Time Calculation (min) 10 min   Patient Education   What was taught? HEP   Method Verbal   Patient comprehension Yes       Raynaldo Opitz, PT, Lic # [id number]

## 2022-12-06 NOTE — Progress Notes (Signed)
SUBJECTIVE:   Kayla Garza is a 67 year old female who presented with pain radiating from left shoulder to her arm with numbness worse on the left D2-3 coming digits x1 month.  She had back pain 2 weeks ago, but now she is pain-free.    She recently finished PT, does home exercises.  She stopped doing chiropractic but that was very helpful as well.  Previously  Patient is feeling better, pain level is down to 7/10. She started PT, 2 sessions so far and saw a chiropractor.  She still continues with radiation down her arm and numbness in left the 2, 3.  Began acutely without injury; no neck pain.  Constant numbness and tingling in the fingers.  Pain is constant, no specific aggravating factors.  Was seen in the ED on 3/ 9 and prescribed Naprosyn which had no effect.  Currently not physically active but continues working.    Quantity: 9/10 in intensity    PT: No  Pain Meds: Naprosyn without effect  Psychiatric:     Past Pain injections: No    Work: Engineer, water  Social: Lives with husband    Imaging: None      REVIEW OF SYSTEMS:  All systems reviewed and are negative except as stated in the HPI.    PAST MEDICAL HISTORY:  Past Medical History:  05/25/2012: Allergic conjunctivitis  No date: Breast lump      Comment:  1/00 Bx > fibroadenoma  2000: Helicobacter pylori (H. 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 7/01 Pap of  vag cuff                Negative; ovaries remain  No date: REDUCTION  MAMMAPLASTY  1994: ROOT CANAL    FAMILY HISTORY:  Review of patient's family history indicates:  Problem: Osteoporosis      Relation: Mother          Age of Onset: (Not Specified)  Problem: Pulmonary      Relation: Mother          Age of Onset: (Not Specified)          Comment: COPD? smoker  Problem: VASCULAR      Relation: Father          Age of Onset: (Not Specified)          Comment: ? DVT  Problem: Psychiatric Illness      Relation: Brother          Age of Onset: (Not Specified)          Comment: depression  Problem: Cancer - Breast      Relation: FamHxNeg          Age of Onset: (Not Specified)  Problem: Cancer - Colon      Relation: FamHxNeg          Age of  Onset: (Not Specified)  Problem: Diabetes      Relation: FamHxNeg          Age of Onset: (Not Specified)  Problem: Heart      Relation: FamHxNeg          Age of Onset: (Not Specified)      SOCIAL HISTORY:  Social History     Socioeconomic History    Marital status: Married     Spouse name: Not on file    Number of children: 0    Years of education: Not on file    Highest education level: Not on file   Occupational History     Employer: [employer]   Tobacco Use    Smoking status: Never    Smokeless tobacco: Never   Substance and Sexual Activity    Alcohol use: No    Drug use: No    Sexual activity: Yes     Partners: Male   Other Topics Concern    Military Service Not Asked    Blood Transfusions Not Asked    Caffeine Concern Not Asked    Occupational Exposure Not Asked    Hobby Hazards Not Asked    Sleep Concern Not Asked    Stress Concern No    Weight Concern Not Asked    Special Diet Not Asked    Back Care Not Asked    Exercise Yes    Bike Helmet Not Asked    Seat Belt Not Asked    Self-Exams Not Asked   Social History Narrative    Works with Public house manager, lives with husband, feels safe    From [place]    Arrived [place] 1985    No children        [name], MD, 02/27/2018    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)    No children           Social Determinants of Catering manager Strain: Not on file  Food Insecurity: Not on file  Transportation Needs: Not on file  Physical Activity: Not on file  Stress: Not on file  Social Connections: Not on file  Intimate Partner Violence: Not on file  Housing Stability: Not on file    ALLERGIES:  Review of Patient's Allergies indicates:  No Known Allergies    CURRENT MEDICATIONS:  [EXPIRED] cyclobenzaprine (FLEXERIL) 5 MG tablet, Take 1 tablet by mouth 2 (two) times daily as needed, Disp: 60 tablet, Rfl: 2  naproxen (NAPROSYN) 500 MG tablet, Take 1 tablet by mouth every 12 (twelve) hours as needed  for up to 7 days, Disp: 14 tablet, Rfl: 0  calcium carbonate + vitamin D (CALTRATE 600+D PLUS) 600-800 MG-UNIT TABS per tablet, Take 2 tablets by mouth in the morning., Disp: 60 tablet, Rfl: 11    No current facility-administered medications on file prior to visit.      OBJECTIVE:  There were no vitals taken for this visit.    General: normal gait, NAD  Psych: AxO x3, normal affect.  HEENT: normocephalic/atraumatic  CV: warm extremitites  LABS REVIEWED:  HEMOGLOBIN A1C   Date Value Ref Range Status   03/22/2022 5.9 (H) 4.0 - 5.6 % Final     Comment:     Hemoglobin A1C         Interpretive information    5.7 - 6.4 %            Increased risk for diabetes;                         recommend lifestyle management        > 6.4 %            Diagnosis of diabetes; should have at                         least 2 elevated results for                         diagnosis of diabetes.        > 8.0 %            Action suggested        < 7.0 %            Goal of therapy for diabetics; higher                         targets may apply for some with                          specific comorbidities.     02/12/2021 5.8 (H) 4.0 - 5.6 % Final     Comment:     Hemoglobin A1C         Interpretive information    5.7 - 6.4 %            Increased risk for diabetes;                         recommend lifestyle management        > 6.4 %            Diagnosis of diabetes; should have at                         least 2 elevated results for                         diagnosis of diabetes.        > 8.0 %            Action suggested        < 7.0 %            Goal of therapy for diabetics; higher                         targets may apply for some with                         specific comorbidities.     05/29/2020 5.7 (H) 4.0 - 5.6 % Final     Comment:     Hemoglobin A1C         Interpretive information    5.7 - 6.4 %  Increased risk for diabetes;                         recommend lifestyle management        > 6.4 %            Diagnosis of diabetes; should have at                         least 2 elevated results for                         diagnosis of diabetes.        > 8.0 %            Action suggested        < 7.0 %            Goal of therapy for diabetics; higher                         targets may apply for some with                         specific comorbidities.       WHITE BLOOD CELL COUNT   Date Value Ref Range Status   09/20/2016 5.1 4.0 - 11.0 TH/uL Final   05/07/2015 5.4 4.0 - 11.0 TH/uL Final     BUN (UREA NITROGEN)   Date Value Ref Range Status   05/11/2018 15 7 - 18 mg/dL Final   16/03/9603 17 6 - 20 mg/dl Final     CREATININE   Date Value Ref Range Status   05/11/2018 0.8 0.4 - 1.2 mg/dL Final   54/02/8118 0.7 0.4 - 1.2 mg/dl Final     ALANINE AMINOTRANSFERASE   Date Value Ref Range Status   05/11/2018 26 12 - 45 U/L Final   08/17/2012 24 7 - 35 IU/L Final     ASPARTATE AMINOTRANSFERASE   Date Value Ref Range Status   05/11/2018 19 8 - 34 U/L Final   08/17/2012 25 8 - 34 IU/L Final     TSH (THYROID STIM HORMONE)   Date Value Ref Range Status   05/17/2017 2.080 0.358 - 3.740 uIU/mL  Final          ASSESSMENT:   Acute left cervical radiculopathy, improved  left C6-7 neuroforaminal narrowing.    Images reviewed: Cervical MRI 09/2022  Diffuse mild to moderate degenerative changes, grade 1 C5-6 spondylolisthesis, left C6-7 neuroforaminal stenosis.      PLAN:  Diagnosis, and treatment were discussed  -Finished PT, chiro; advised to continue regular HEP  -Medications: Did not tolerate Flexeril 5 mg , tramadol due to sedation    f/u as needed

## 2022-12-07 ENCOUNTER — Ambulatory Visit
Payer: Exclusive Provider Organization | Attending: Physical Medicine & Rehabilitation | Admitting: Physical Medicine & Rehabilitation

## 2022-12-07 DIAGNOSIS — M5412 Radiculopathy, cervical region: Secondary | ICD-10-CM | POA: Diagnosis present

## 2022-12-09 ENCOUNTER — Other Ambulatory Visit: Payer: Self-pay

## 2022-12-09 ENCOUNTER — Encounter (HOSPITAL_BASED_OUTPATIENT_CLINIC_OR_DEPARTMENT_OTHER): Payer: Self-pay | Admitting: Internal Medicine

## 2022-12-09 ENCOUNTER — Ambulatory Visit: Payer: Exclusive Provider Organization | Attending: Internal Medicine | Admitting: Internal Medicine

## 2022-12-09 VITALS — BP 128/81 | HR 82 | Wt 158.0 lb

## 2022-12-09 DIAGNOSIS — M79602 Pain in left arm: Secondary | ICD-10-CM | POA: Diagnosis present

## 2022-12-09 DIAGNOSIS — R200 Anesthesia of skin: Secondary | ICD-10-CM | POA: Insufficient documentation

## 2022-12-09 DIAGNOSIS — M4802 Spinal stenosis, cervical region: Secondary | ICD-10-CM | POA: Diagnosis present

## 2022-12-09 DIAGNOSIS — Z7189 Other specified counseling: Secondary | ICD-10-CM | POA: Insufficient documentation

## 2022-12-09 DIAGNOSIS — Z1212 Encounter for screening for malignant neoplasm of rectum: Secondary | ICD-10-CM | POA: Diagnosis present

## 2022-12-09 DIAGNOSIS — M79642 Pain in left hand: Secondary | ICD-10-CM | POA: Insufficient documentation

## 2022-12-09 DIAGNOSIS — Z1211 Encounter for screening for malignant neoplasm of colon: Secondary | ICD-10-CM | POA: Insufficient documentation

## 2022-12-09 NOTE — Progress Notes (Signed)
Kayla Garza is a 67 year old female , [place],       For the past 2 wk, no more cervical pain  Hand pain resolved    She was discharged from PT, and chiropractor, and physiatry    She feels ready to return to work  She feels that lifting weight > 10lb above head puts too much pressure on upper body and can trigger cervical pain again    OBJECTIVE:  BP 128/81   Pulse 82   Wt 71.7 kg (158 lb)   SpO2 97%   BMI 30.62 kg/m     Pleasant, in NAD  Heart: S1 and S2 normal, no murmurs, clicks, gallops or rubs. Regular rate and rhythm.   Lungs:  clear; no wheezes, rhonchi or rales.  MSK: no deformities. 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 Followed by physiatry, and discharged to FU prn given resolution of pain. Pt is cleared to return to work with restriction to limit wt lifting >10lb above shoulder line. Letter provided    [phone]) L hand pain  Focal pain on dorsal aspect of the hand, usually triggered by flexion movement of the wrist. Felt dismissed by ortho's eval that attributed pain to radiculopathy  Discussed with PT, who recommended OT  Currently no pain, as pt has been out of work for almost two months.  I will leave an open referral to OT in case the pain restarts once she returns to work. Referral per EPIC orders      The patient was ready to learn and no apparent learning or adherence barriers were identified. I explained the diagnosis and treatment plan, and the patient expressed understanding of the content. I attempted to answer any questions regarding the diagnosis and the proposed treatment.    We discussed the patient's current medications.  We discussed the importance of medication compliance. The patient expressed understanding and no barriers to  adherence were identified.    follow-up prn  she has been advised to call or return with any worsening or new problems

## 2022-12-13 ENCOUNTER — Telehealth (HOSPITAL_BASED_OUTPATIENT_CLINIC_OR_DEPARTMENT_OTHER): Payer: Self-pay | Admitting: Internal Medicine

## 2022-12-13 NOTE — Telephone Encounter (Signed)
Kayla Garza is a 67 year old old female.    Patient's PCP: Irine Seal, APRN    In case we get disconnected what is the best number to reach you at today:  Mobile Phone   Telephone Information:   Mobile [phone]       Person calling:  Patient (self)    How can I help you today:   Other said she needs a letter stating she is ok to return to work but does not want it stating about not lifting more than 10lb    Patient's language of care: [place]    Would you like an interpreter when the nurse calls you back?  NO

## 2022-12-13 NOTE — Telephone Encounter (Signed)
Letter written and left for pick up, as pt requested    Florestine Avers, APRN, 12/13/2022

## 2023-02-09 ENCOUNTER — Ambulatory Visit: Payer: Exclusive Provider Organization | Attending: Dermatology | Admitting: Dermatology

## 2023-02-09 ENCOUNTER — Other Ambulatory Visit: Payer: Self-pay

## 2023-02-09 DIAGNOSIS — L578 Other skin changes due to chronic exposure to nonionizing radiation: Secondary | ICD-10-CM | POA: Diagnosis not present

## 2023-02-09 DIAGNOSIS — R21 Rash and other nonspecific skin eruption: Secondary | ICD-10-CM | POA: Diagnosis present

## 2023-02-09 DIAGNOSIS — D229 Melanocytic nevi, unspecified: Secondary | ICD-10-CM | POA: Diagnosis present

## 2023-02-09 MED ORDER — TRIAMCINOLONE ACETONIDE 0.1 % EX OINT
TOPICAL_OINTMENT | Freq: Two times a day (BID) | CUTANEOUS | 0 refills | Status: AC
Start: 2023-02-09 — End: 2023-03-11

## 2023-02-09 NOTE — Progress Notes (Addendum)
[facility]  [address] [place], [place] [address]    Dermatology Clinic Note    CC: Rash.   ?  HPI: Darielis Da Adalai Patience is a 67 year old female     She has a several years history of a rash that comes and goes. No prior treatment.     Reports rash comes and goes, TAC improves it.   ?  ROS: negative, no other skin complaints.  Feels well, no systemic complaints.   ?  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     Note: No fungal organisms are identified with a PAS stain performed at the  outside institution and reviewed at [facility]. Additional immunostains performed  at [facility] reveal that the majority of the immune cell infiltrate consists of  small CD3+ T cells without epidermal tagging. Scattered small admixed CD20+  B cells are present. CD1a highlights scattered intraepidermal Langerhans  cells and intradermal Langerhans cells/dendritic cells. A CD30 immunostain  highlights only rare dermal cells.     The findings are not entirely specific but favor a hypersensitivity  reaction or possibly eczematous dermatitis in the appropriate context.  Psoriasis is less favored. Typical histologic features of a  lymphoproliferative disorder are not seen. Clinical correlation is needed.     After review of the entire case, Lyn McDivitt Para March MD  of [facility] has rendered the above diagnosis, with which I concur.     Diagnosis by: Avie Arenas, MD        ?  PMH:  Patient Active Problem List:     Overweight (BMI 25.0-29.9)     Vitamin D deficiency     Ovarian cyst     Allergic rhinitis     Renal anomaly     Pinguecula of both eyes     Allergic conjunctivitis     Hyperopia with astigmatism and presbyopia     History of Cushing disease     Osteoarthritis of both knees     Coccyx pain     Laryngopharyngeal reflux (LPR)     Palpitations     Goiter     S/P hysterectomy     History of hyperthyroidism     Epidermal cyst     Osteopenia of multiple sites     Neck pain, acute     Pain and numbness of left upper extremity     Cervical stenosis of spinal canal    ?  Meds:  ?     Medication List            Accurate as of October 08, 2022  5:00 PM. If you have any questions, ask your nurse or doctor.  CONTINUE taking these medications      calcium carbonate + vitamin D 600-800 MG-UNIT Tabs per tablet  Take 2 tablets by mouth in the morning.     cyclobenzaprine 5 MG tablet  Commonly known as: FLEXERIL  Take 1 tablet by mouth 2 (two) times daily as needed     diclofenac 1 % Gel Gel  Commonly known as: Voltaren  Apply 2 g topically in the morning and 2 g at noon and 2 g in the evening and 2 g before bedtime.     naproxen 500 MG tablet  Commonly known as: NAPROSYN  Take 1 tablet by mouth every 12 (twelve) hours as needed  for up to 7 days            ?  All:  Review of Patient's Allergies indicates:  No Known Allergies?     SHx:  ?Social History    Tobacco Use      Smoking status: Never      Smokeless tobacco: Never    Alcohol use: No    Drug use: No       FHx:  Hx of melanoma - , hx of NMSC -  ?  Physical exam/Assessment/Plan:  Well appearing pt in NAD  Mood and affect are wnl  A full skin examination was performed including scalp, head, eyes, ears, nose,  lips,  neck, chest, axillae, abdomen, back, buttocks, bilateral upper extremities,  bilateral lower extremities, hands, feet, fingers, toes, fingernails, and toenails.    Skin type: III    # Rash. Only 1 scaly papule on the L neck, some scaly patches on the feet, KOH was negative on the feet. I wonder if this is psoriasis.   - Triamcinolone 0.1% ointment BID, 2 weeks on, 2 weeks off. Maximum use of two weeks per month. Steroid side effects discussed including skin atrophy, fragility, telangiectasias, striae, pigmentary changes.   - Told her that tinea is possible for the feet, if topical steroids make the rash worse to stop and call me  - check treponemal ab today.     # Benign nevi, scattered on trunk and extremities: benign appearing brown macules and papules, most <75mm with no concerning features on exam or dermoscopy.   - Skin self-examination reviewed; ABCD's discussed.  - Sun protection and avoidance reviewed, including proper use of broad-spectrum UVB/UVA sunscreens with SPF 30 or greater, re-application after swimming and q 3-4 hours emphasized.?    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 Benign tan macules with moth-eaten borders c/w solar lentigines present.   - Skin self-examination reviewed; ABCD's discussed.   ??- Sun protection and avoidance reviewed, including proper use of broad-spectrum UVB/UVA sunscreens with SPF 30 or greater, re-application after swimming and q 3-4 hours emphasized.?    RTC: 109mo    ?  ?  Maudry Mayhew, MD  [facility]         CC: Irine Seal, APRN  [address]  [place] [address]

## 2023-02-10 ENCOUNTER — Ambulatory Visit
Admission: RE | Admit: 2023-02-10 | Discharge: 2023-02-10 | Disposition: A | Discharge status: Home / Self Care | Payer: Exclusive Provider Organization | Attending: Dermatology | Admitting: Dermatology

## 2023-02-10 DIAGNOSIS — R21 Rash and other nonspecific skin eruption: Secondary | ICD-10-CM | POA: Diagnosis present

## 2023-02-11 LAB — TREPONEMA PALLIDUM AB IGG: TREPONEMA PALLIDUM AB IGG: NONREACTIVE

## 2023-02-15 ENCOUNTER — Telehealth (HOSPITAL_BASED_OUTPATIENT_CLINIC_OR_DEPARTMENT_OTHER): Payer: Self-pay

## 2023-02-15 NOTE — Telephone Encounter (Signed)
-----   Message from Marlowe Shores sent at 02/15/2023  2:03 PM EDT -----  Can you let the patient know the syphilis test was negative.    Maudry Mayhew, MD  [facility]

## 2023-02-15 NOTE — Telephone Encounter (Signed)
Pt returning your call

## 2023-02-15 NOTE — Telephone Encounter (Signed)
Call to patient, with interpreter ID [phone], to call clinic and speak with RN

## 2023-02-16 NOTE — Telephone Encounter (Signed)
LVM using interpreter for pt to return call.          ----- Message from Marlowe Shores sent at 02/15/2023  2:03 PM EDT -----  Can you let the patient know the syphilis test was negative.     Maudry Mayhew, MD  [facility]

## 2023-02-17 NOTE — Telephone Encounter (Signed)
Spoke with pt with the assistance of the interpreter and discussed the following:  Message from Marlowe Shores sent at 02/15/2023  2:03 PM EDT -----  Can you let the patient know the syphilis test was negative.     Maudry Mayhew, MD  [facility]

## 2023-04-19 ENCOUNTER — Ambulatory Visit
Admission: RE | Admit: 2023-04-19 | Discharge: 2023-04-19 | Disposition: A | Discharge status: Home / Self Care | Payer: Exclusive Provider Organization | Attending: Diagnostic Radiology | Admitting: Diagnostic Radiology

## 2023-04-19 ENCOUNTER — Other Ambulatory Visit: Payer: Self-pay

## 2023-04-19 DIAGNOSIS — M8588 Other specified disorders of bone density and structure, other site: Secondary | ICD-10-CM | POA: Diagnosis present

## 2023-04-19 DIAGNOSIS — M8589 Other specified disorders of bone density and structure, multiple sites: Secondary | ICD-10-CM

## 2023-04-19 DIAGNOSIS — Z780 Asymptomatic menopausal state: Secondary | ICD-10-CM | POA: Insufficient documentation

## 2023-04-25 ENCOUNTER — Telehealth (HOSPITAL_BASED_OUTPATIENT_CLINIC_OR_DEPARTMENT_OTHER): Payer: Self-pay

## 2023-04-25 NOTE — Telephone Encounter (Signed)
Called patient via telephone interpreter [place] MW#:[id number]) discussing results and recommendations as noted below. Patient confirmed an understanding and had no questions.      Kayla Cella, MD  [facility]  Please let pt know that her bone density looks good- still no osteoporosis. I will see her at her appointment next month.

## 2023-04-25 NOTE — Telephone Encounter (Signed)
Called patient via telephone interpreter [place] - WU#:[id number]) as noted below. Patient at work and unable to take call.  Requested a call back at 11:15 am.      Kayla Cella, MD  [facility]  Please let pt know that her bone density looks good- still no osteoporosis. I will see her at her appointment next month.

## 2023-05-09 ENCOUNTER — Ambulatory Visit: Payer: Exclusive Provider Organization | Attending: Internal Medicine | Admitting: Internal Medicine

## 2023-05-09 DIAGNOSIS — M4802 Spinal stenosis, cervical region: Secondary | ICD-10-CM | POA: Diagnosis present

## 2023-05-09 DIAGNOSIS — M79642 Pain in left hand: Secondary | ICD-10-CM | POA: Insufficient documentation

## 2023-05-09 DIAGNOSIS — Z7185 Encounter for immunization safety counseling: Secondary | ICD-10-CM | POA: Insufficient documentation

## 2023-05-09 DIAGNOSIS — Z780 Asymptomatic menopausal state: Secondary | ICD-10-CM | POA: Insufficient documentation

## 2023-05-09 DIAGNOSIS — R200 Anesthesia of skin: Secondary | ICD-10-CM | POA: Insufficient documentation

## 2023-05-09 DIAGNOSIS — Z129 Encounter for screening for malignant neoplasm, site unspecified: Secondary | ICD-10-CM | POA: Insufficient documentation

## 2023-05-09 DIAGNOSIS — M79602 Pain in left arm: Secondary | ICD-10-CM | POA: Insufficient documentation

## 2023-05-09 DIAGNOSIS — M8580 Other specified disorders of bone density and structure, unspecified site: Secondary | ICD-10-CM | POA: Diagnosis present

## 2023-05-09 MED ORDER — DICLOFENAC SODIUM 1 % EX GEL
2.0000 g | Freq: Four times a day (QID) | CUTANEOUS | 0 refills | Status: AC
Start: 2023-05-09 — End: 2023-06-08

## 2023-05-09 MED ORDER — CALTRATE 600+D PLUS MINERALS 600-800 MG-UNIT PO TABS
2.0000 | ORAL_TABLET | Freq: Every day | ORAL | 11 refills | Status: DC
Start: 2023-05-09 — End: 2023-05-26

## 2023-05-09 NOTE — Progress Notes (Signed)
Kayla Garza is a 67 year old female, [place], I last saw in June/24, with hx cervical stenosis, followed by physiatry but discharged from PT d/t resolution of pain    Video visit    -- wanted an in person appt for routine check up  Wanted to check TSH (last one in December/23)    Dad has kidney problems, and she wanted to have them checked  He has no HTN, and has controlled DM  He's in his 90s    She has no blood in urine, no dysuria, no urinary sx, no flank pain    She wanted to see Dr Olam Idler again for a lump on her L wrist that is tender to pressure, or if she forces the hand  Chronic pain    Arm paresthesia has resolved for now  She continues doing HEP, and sometimes sees a chiropractor    OBJECTIVE:  VS and PE deferred d/t telehealth visit  On video, pt appears well, speaking in full sentences, in NAD    ASSESSMENT & PLAN:  (W29.562) Left hand pain  (primary encounter diagnosis)  Comment: As above  Plan: Referral to ortho per Epic orders    (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 Requests refill diclofenac  Concerned about kidney health given father's hx, and her intake of pain meds. Future labs for BMP  Plan: BASIC METABOLIC PANEL, diclofenac (VOLTAREN) 1         % GEL Gel    (Z71.85) Immunization counseling  Comment:  Rationale for immunization reviewed. Pt educated where they can be vaccinated, should they decide for immunization.     (Z12.9) Cancer screening  Comment:   Plan: [place] SCREENING MAMMO BILATERAL DIGITAL WITH DBT &        CAD          (M85.80,  Z78.0) Osteopenia after menopause  Comment: needs refill  Plan: calcium carbonate + vitamin D (CALTRATE 600+D         PLUS) 600-800 MG-UNIT TABS per tablet      The patient was ready to learn and no apparent learning or adherence barriers were identified. I explained the diagnosis and treatment plan, and the patient  expressed understanding of the content. I attempted to answer any questions regarding the diagnosis and the proposed treatment.    We discussed the patient's current medications.  We discussed the importance of medication compliance. The patient expressed understanding and no barriers to adherence were identified.    follow-up   -- has appt with Dr Francena Hanly in early December. Defer mgmt hypothyroidism, will likely check TSH. May have BMP at that same time  -- with me, prn  she has been advised to call or return with any worsening or new problems

## 2023-05-26 ENCOUNTER — Other Ambulatory Visit: Payer: Self-pay

## 2023-05-26 ENCOUNTER — Ambulatory Visit: Payer: Exclusive Provider Organization | Attending: Endocrinology | Admitting: Endocrinology

## 2023-05-26 ENCOUNTER — Encounter (HOSPITAL_BASED_OUTPATIENT_CLINIC_OR_DEPARTMENT_OTHER): Payer: Self-pay | Admitting: Endocrinology

## 2023-05-26 VITALS — BP 167/91 | HR 71 | Temp 97.6°F | Wt 159.0 lb

## 2023-05-26 DIAGNOSIS — R5383 Other fatigue: Secondary | ICD-10-CM | POA: Diagnosis present

## 2023-05-26 DIAGNOSIS — Z8639 Personal history of other endocrine, nutritional and metabolic disease: Secondary | ICD-10-CM | POA: Insufficient documentation

## 2023-05-26 DIAGNOSIS — M8580 Other specified disorders of bone density and structure, unspecified site: Secondary | ICD-10-CM | POA: Insufficient documentation

## 2023-05-26 DIAGNOSIS — Z780 Asymptomatic menopausal state: Secondary | ICD-10-CM | POA: Diagnosis present

## 2023-05-26 DIAGNOSIS — R1013 Epigastric pain: Secondary | ICD-10-CM | POA: Diagnosis present

## 2023-05-26 DIAGNOSIS — R5381 Other malaise: Secondary | ICD-10-CM | POA: Diagnosis present

## 2023-05-26 MED ORDER — CALTRATE 600+D PLUS MINERALS 600-800 MG-UNIT PO TABS
2.0000 | ORAL_TABLET | Freq: Every day | ORAL | 11 refills | Status: AC
Start: 2023-05-26 — End: 2024-05-25

## 2023-05-26 NOTE — Progress Notes (Signed)
Presents for annual f/u of thyroid disorder and remote hx of pituitary Cushing's  HPI:  1. SC Hyperthyroidism  9/13 scalp hair loss and tired legs: TSH sent and found to be 0.17  10/13 ultrasound thyroid heterogeneous hypervascular; no nodules; right and left lobes both 4 cm; thyroid scan non-focal  11/13-2/14 took methimazole without improvement in symptoms    2/14 TSI 87 (negative); TPO 13; 50 g irregular goiter on exam  11/17 anti-TG negative  12/22 reporting palpitations but TSH slightly high; no tx yet  12/23 reporting fatigue, "heat" associated with DOE; but TSH wnl      2. Hx of Cushings   1980 transphenoidal surgery in [place]  2012, 2014 work up showed normal pituitary function--> no residual disease      3. Osteopenia  10/22 BMD: T-1.9 spine, T-2.0 left FN; T- 1.7 right FN, FRAX 9% and 1.3%  10/24 BMD: T-2.1 spine, T-1.8 left FN, T-1.9 right FN (decr 4%); FRAX 10% and 0.7%        calcium carbonate + vitamin D (CALTRATE 600+D PLUS) 600-800 MG-UNIT TABS per tablet, Take 2 tablets by mouth daily, Disp: 60 tablet, Rfl: 11  diclofenac (VOLTAREN) 1 % GEL Gel, Apply 2 g topically in the morning and 2 g at noon and 2 g in the evening and 2 g before bedtime., Disp: 100 g, Rfl: 0  naproxen (NAPROSYN) 500 MG tablet, Take 1 tablet by mouth every 12 (twelve) hours as needed  for up to 7 days, Disp: 14 tablet, Rfl: 0    No current facility-administered medications on file prior to visit.    She states that she's not taking the calcium/d because her [facility] pharmacist told her it cannot be filled and showed her the OTC version, but she did not want to use that      I have reviewed the patient's medical, social, family history and meds in detail and updated the computerized patient record.      ROS: still has fatigue and feeling of "heat, and feels like going to suffocate". No dizziness, balance issues, falls,breathing problems, CP. Had an episode of severe stomach pain (points to epigastrum) once a few weeks ago; this  resolved spontaneously and has not recurred. Had not eaten any spicy or greasy food. All other ROS reviewed and confirmed as negative.    PHYSICAL EXAMINATION.  BP (!) 167/91   Pulse 71   Temp 97.6 F (36.4 C) (Temporal)   Wt 72.1 kg (159 lb)   SpO2 100%   BMI 30.81 kg/m   Pain Score: Data Unavailable  Body mass index is 30.81 kg/m.  No acute distress. Well developed, well-nourished.   Eyes: Full extraocular movements. No proptosis, lid lag, or stare.   Neck: Negative for goiter. No cervical lymphadenopathy appreciated.   Cardiovascular: Regular rate and rhythm, normal S1 and S2. No murmur/gallop/rub heard.  Respiratory: Clear to auscultation and percussion. Negative for respiratory distress/rales.   VWU:JWJX non-tender no masses/megaly/rebound/guarding. No epigastric tenderness to deep palpation  Musculoskeletal: Without clubbing, cyanosis or edema.   Skin: Cool and dry to touch without obvious lesions.   Neurological: Intact grossly.  Psychiatric: Normal affect and responsiveness to questions.               HEMOGLOBIN A1C (%)   Date Value   03/22/2022 5.9 (H)   02/12/2021 5.8 (H)   05/29/2020 5.7 (H)     VITAMIN D,25 HYDROXY (ng/mL)   Date Value   06/10/2022 33  02/12/2021 31   05/11/2018 27 (L)          Latest Ref Rng 06/04/2021  4:22 PM 06/10/2022  3:34 PM   THYROID FLOWSHEET      Thyroid Screen TSH 0.270 - 4.200 uIU/mL 4.540 (H)  2.190    Thyroid Stim Hormone 0.358 - 3.740 uIU/mL     Free Thyroxine 0.93 - 1.70 ng/dL 1.61       Component      Latest Ref Rng 06/10/2022  3:34 PM   FERRITIN      13 - 150 ng/mL 13    VITAMIN D,25 HYDROXY      30.0 - 100.0 ng/mL 33    THYROID SCREEN TSH REFLEX FT4      0.270 - 4.200 uIU/mL 2.190            Study Result    Narrative & Impression   STUDY: Bone Densitometry Scan     INDICATION: 67 years old postmenopausal female for evaluation of bone density.     COMPARISON: 04/14/2021.     RISK FACTORS: Menopause, parent with hip fracture     EXAM: Bone mineral density exam  was performed on a [employer] version 005.005.005.005. Bone density was performed in the lumbar spine in AP projection  and in both hips. Results are given as BMD in g/cm2. T-score indicating comparison with young adult mean and Z-score indicating comparison with age matched mean are provided. These are provided using appropriate matched ethnicity tables as available.     Bone density in the AP lumbar spine measured at L1-L4 is as follows:     BMD = 0.821 g/cm2  T-score = -2.1  Z-score = -0.3  Change from previous study: = No significant change.      Bone density in the left femoral neck is as follows:     BMD = 0.651 g/cm2  T-score = -1.8  Z-score = -0.5     Total left hip bone density is as follows:     BMD = 0.783 g/cm2  T-score = -1.3  Z-score = -0.2  Change from previous study: = No significant change.      Bone density in the right femoral neck is as follows:     BMD = 0.641 g/cm2  T-score = -1.9  Z-score = -0.6     Total right hip bone density is as follows:     BMD = 0.796 g/cm2  T-score = -1.2  Z-score = -0.1  Change from previous study: = -4%     IMPRESSION:     The lowest measured BMD is in the lumbar spine with a T-score of -2.1. This patient is considered to have low bone mass (osteopenia) according to the World Health Organization [facility]) criteria. Compared to the previous study there has been no statistically significant change in bone mineral density in the lumbar spine or left hip and a 4% decrease in the right hip.     The 10 year fracture risk based on WHO fracture risk assessment for major osteoporotic fracture is 10%.The 10 year fracture risk for hip fracture is 0.7% based on the following risk factors: Parental fracture     World Health Organization (WHO) criteria for postmenopausal women and men over 30 years old:                Normal:  T-score above -1 Standard Deviation  Low Bone Mass: T-score between -1 and -2.5 SD  Osteoporosis:  T-score at or below -2.5 SD     FRAX:   Pharmacologic treatment should be considered when a risk of major osteoporotic fracture reaches 20% or more, or the risk of hip fracture reaches 3%.  FRAX only considers the bone densities in the hips.        Reviewed and Electronically Signed By: Lavella Lemons, MD  Signed Date and Time: 04/19/2023 4:46 PM            (Z86.39) History of hyperthyroidism  (primary encounter diagnosis)  Comment: clinically euthyroid  Plan: THYROID SCREEN TSH REFLEX FT4            (R53.81,  R53.83) Malaise and fatigue  Comment: no clear etiology although ferritin was at the low end of the range last year; also has hx of prediabetes and has not had A1C this year  Plan: HEMOGLOBIN A1C, TOTAL IRON BINDING CAPACITY,         IRON, FERRITIN            (Z86.39) History of Cushing disease  Comment: not Cushinoid, but given fatigue and hx of  transphenoidal surgery should check AM cortisol  Plan: CORTISOL AM        She will try to get permission from boss to go to work late so she can do fasting labs      (M85.80,  Z78.0) Osteopenia after menopause  Comment: BMD stable but not taking calcium/D  Plan: calcium carbonate + vitamin D (CALTRATE 600+D         PLUS) 600-800 MG-UNIT TABS per tablet script resent with comment to fill in pharmacy or contact me if unable to do so            (R10.13) Epigastric pain  Comment: resolved  Plan:   -see PCP if recurs      I spent a total of 40 minutes on this visit on the date of service (total time includes all activities performed on the date of service)

## 2023-05-26 NOTE — Patient Instructions (Signed)
Call [phone] to book a lab visit at [facility], or just go to [facility]

## 2023-05-30 ENCOUNTER — Ambulatory Visit
Admission: RE | Admit: 2023-05-30 | Discharge: 2023-05-30 | Disposition: A | Discharge status: Home / Self Care | Payer: Exclusive Provider Organization | Attending: Endocrinology | Admitting: Endocrinology

## 2023-05-30 ENCOUNTER — Other Ambulatory Visit: Payer: Self-pay

## 2023-05-30 DIAGNOSIS — R200 Anesthesia of skin: Secondary | ICD-10-CM | POA: Insufficient documentation

## 2023-05-30 DIAGNOSIS — R5381 Other malaise: Secondary | ICD-10-CM | POA: Insufficient documentation

## 2023-05-30 DIAGNOSIS — R5383 Other fatigue: Secondary | ICD-10-CM | POA: Diagnosis present

## 2023-05-30 DIAGNOSIS — M79602 Pain in left arm: Secondary | ICD-10-CM | POA: Insufficient documentation

## 2023-05-30 DIAGNOSIS — Z8639 Personal history of other endocrine, nutritional and metabolic disease: Secondary | ICD-10-CM | POA: Diagnosis present

## 2023-05-30 LAB — BASIC METABOLIC PANEL
ANION GAP: 9 mmol/L — ABNORMAL LOW (ref 10–22)
BUN (UREA NITROGEN): 11 mg/dL (ref 7–18)
CALCIUM: 9.4 mg/dL (ref 8.5–10.5)
CARBON DIOXIDE: 27 mmol/L (ref 21–32)
CHLORIDE: 103 mmol/L (ref 98–107)
CREATININE: 0.7 mg/dL (ref 0.4–1.2)
ESTIMATED GLOMERULAR FILT RATE: >60 mL/min (ref >60)
Glucose Random: 103 mg/dL (ref 74–160)
POTASSIUM: 4 mmol/L (ref 3.5–5.1)
SODIUM: 139 mmol/L (ref 136–145)

## 2023-05-30 LAB — FERRITIN: FERRITIN: 11 ng/mL — ABNORMAL LOW (ref 13–150)

## 2023-05-30 LAB — TOTAL IRON BINDING CAPACITY: TOTAL IRON BIND CAPACITY CALC: 449 ug/dL (ref 280–504)

## 2023-05-30 LAB — IRON: IRON: 53 ug/dL (ref 50–170)

## 2023-05-30 LAB — HEMOGLOBIN A1C
ESTIMATED AVERAGE GLUCOSE: 120 mg/dL (ref 74–160)
HEMOGLOBIN A1C: 5.8 % — ABNORMAL HIGH (ref 4.0–5.6)

## 2023-05-30 LAB — THYROID SCREEN TSH REFLEX FT4: THYROID SCREEN TSH REFLEX FT4: 3.36 u[IU]/mL (ref 0.270–4.200)

## 2023-05-30 LAB — CORTISOL AM: CORTISOL AM: 15.4 ug/dL (ref 4.82–19.50)

## 2023-05-30 NOTE — Progress Notes (Signed)
Stable test, no further action.   Reviewed in [place], via MyChart  Florestine Avers, APRN, 05/30/2023

## 2023-06-06 ENCOUNTER — Telehealth (HOSPITAL_BASED_OUTPATIENT_CLINIC_OR_DEPARTMENT_OTHER): Payer: Self-pay | Admitting: Ambulatory Care

## 2023-06-06 NOTE — Telephone Encounter (Signed)
 Called patient via telephone interpreter:Yes.  What is their name? [id number].  Results and plan of care discussed as noted below. Provider recommendations reviewed in detail with patient. Patient confirmed understanding and had no further questions.       Patient had interest in nutrition referral - will advise Provider.    Mikel Cella, MD  [facility]  Please call pt  1. Her blood sugars are still in the prediabetes range, unchanged compared to last year  2. Her thyroid level is normal  3. Her iron levels are low- I"ve asked her PCP Florestine Avers NP to evaluate the cause of the low iron

## 2023-06-30 ENCOUNTER — Other Ambulatory Visit: Payer: Self-pay

## 2023-06-30 ENCOUNTER — Ambulatory Visit: Payer: Exclusive Provider Organization | Attending: Internal Medicine | Admitting: Internal Medicine

## 2023-06-30 ENCOUNTER — Encounter (HOSPITAL_BASED_OUTPATIENT_CLINIC_OR_DEPARTMENT_OTHER): Payer: Self-pay | Admitting: Internal Medicine

## 2023-06-30 VITALS — BP 171/78 | HR 65 | Wt 158.0 lb

## 2023-06-30 DIAGNOSIS — R030 Elevated blood-pressure reading, without diagnosis of hypertension: Secondary | ICD-10-CM | POA: Insufficient documentation

## 2023-06-30 DIAGNOSIS — R5383 Other fatigue: Secondary | ICD-10-CM | POA: Diagnosis present

## 2023-06-30 DIAGNOSIS — E611 Iron deficiency: Secondary | ICD-10-CM | POA: Insufficient documentation

## 2023-06-30 DIAGNOSIS — K5900 Constipation, unspecified: Secondary | ICD-10-CM | POA: Insufficient documentation

## 2023-06-30 LAB — CBC, PLATELET & DIFFERENTIAL
ABSOLUTE BASO COUNT: 0 10*3/uL (ref 0.0–0.1)
ABSOLUTE EOSINOPHIL COUNT: 0.1 10*3/uL (ref 0.0–0.8)
ABSOLUTE IMM GRAN COUNT: 0.02 10*3/uL (ref 0.00–0.10)
ABSOLUTE LYMPH COUNT: 2.8 10*3/uL (ref 0.6–5.9)
ABSOLUTE MONO COUNT: 0.5 10*3/uL (ref 0.2–1.4)
ABSOLUTE NEUTROPHIL COUNT: 3.8 10*3/uL (ref 1.6–8.3)
ABSOLUTE NRBC COUNT: 0 10*3/uL (ref 0.0–0.0)
BASOPHIL %: 0.3 % (ref 0.0–1.2)
EOSINOPHIL %: 1.4 % (ref 0.0–7.0)
HEMATOCRIT: 38.9 % (ref 34.1–44.9)
HEMOGLOBIN: 12 g/dL (ref 11.2–15.7)
IMMATURE GRANULOCYTE %: 0.3 % (ref 0.0–1.0)
LYMPHOCYTE %: 38.5 % (ref 15.0–54.0)
MEAN CORP HGB CONC: 30.8 g/dL — ABNORMAL LOW (ref 31.0–37.0)
MEAN CORPUSCULAR HGB: 26.4 pg (ref 26.0–34.0)
MEAN CORPUSCULAR VOL: 85.5 fL (ref 80.0–100.0)
MEAN PLATELET VOLUME: 10.1 fL (ref 8.7–12.5)
MONOCYTE %: 7.3 % (ref 4.0–13.0)
NEUTROPHIL %: 52.2 % (ref 40.0–75.0)
NRBC %: 0 % (ref 0.0–0.0)
PLATELET COUNT: 385 10*3/uL (ref 150–400)
RBC DISTRIBUTION WIDTH STD DEV: 48.7 fL — ABNORMAL HIGH (ref 35.1–46.3)
RED BLOOD CELL COUNT: 4.55 M/uL (ref 3.90–5.20)
WHITE BLOOD CELL COUNT: 7.2 10*3/uL (ref 4.0–11.0)

## 2023-06-30 LAB — VITAMIN D,25 HYDROXY: VITAMIN D,25 HYDROXY: 28 ng/mL — ABNORMAL LOW (ref 30.0–100.0)

## 2023-06-30 MED ORDER — POLYETHYLENE GLYCOL 3350 17 GM/SCOOP PO POWD
17.0000 g | Freq: Every day | ORAL | 1 refills | Status: AC | PRN
Start: 2023-06-30 — End: 2023-12-27

## 2023-06-30 NOTE — Progress Notes (Signed)
Kayla Garza is a 68 year old female [place], Hx Cushing, I last saw via televisit in Nov/24  Followed by endocrinology, Dr Francena Hanly, last visit Dec/24 - prediabetic, unchanged. Normal TSH. Low iron (ferritin 11, iron 53, TIBC 449)  -- interested in nutrition referral  GI sx? -- hx ferritin on low normal, no cbc    FERRITIN (ng/mL)       Date                     Value                 05/30/2023               11 (L)                06/10/2022               13                    05/07/2015               13               ----------    IRON (ug/dL)       Date                     Value                 05/30/2023               53               ----------    HEMOGLOBIN A1C (%)   Date Value   05/30/2023 5.8 (H)   03/22/2022 5.9 (H)   02/12/2021 5.8 (H)     Most Recent Weight Reading(s)  06/30/23 : 71.7 kg (158 lb)  05/26/23 : 72.1 kg (159 lb)  12/09/22 : 71.7 kg (158 lb)  11/08/22 : 71.7 kg (158 lb)  09/30/22 : 71.3 kg (157 lb 3.2 oz)      Feeling tired, usually in the middle of the day  Usually happens after her break. Does not happen every day, it lasts for a short time, resolves on its own  Has salad, potatoes, small portions  No cp, SOB, no n/v/d  + constipation    Most Recent BP Reading(s)  06/30/23 : 171/78  05/26/23 : (!) 167/91  12/09/22 : 128/81  11/08/22 : 142/88  09/30/22 : 141/82    OBJECTIVE:  BP 171/78 (Site: RA, Position: Sitting, Cuff Size: Reg)   Pulse 65   Wt 71.7 kg (158 lb)   SpO2 99%   BMI 30.62 kg/m   Pleasant, in NAD  Heart: S1 and S2 normal, no murmurs, clicks, gallops or rubs. Regular rate and rhythm.   Lungs:  clear; no wheezes, rhonchi or rales.  Abdomen: soft, bs in 4 quadrants, not distended, no guarding, no masses, no tenderness, no organomegaly, no suprapubic or epigastric pain      ASSESSMENT & PLAN:  (K59.00) Constipation, unspecified constipation type  Comment: Mgmt of constipation reviewed in details. Focus on high fiber diety, hydration  If no bm in 2-3d, may try  glycolax prn  Plan: polyethylene glycol (GLYCOLAX) 17 GM/SCOOP         powder          (E61.1) Low iron  Comment: workup  Plan: CBC, PLATELET & DIFFERENTIAL, COLONOSCOPY,  REFERRAL TO HEMATOLOGY (INT)          (R53.83) Feeling tired  Comment: Likely multifactorial, wu for anemia as above, check Vit D.  Plan: VITAMIN D,25 HYDROXY          (R03.0) Elevated BP without diagnosis of hypertension  Comment: noticed in the past 2 visits, and in the past.  Plan: Low sodium diet, wt mgmt, monitor    The patient was ready to learn and no apparent learning or adherence barriers were identified. I explained the diagnosis and treatment plan, and the patient expressed understanding of the content. I attempted to answer any questions regarding the diagnosis and the proposed treatment.    We discussed the patient's current medications.  We discussed the importance of medication compliance. The patient expressed understanding and no barriers to adherence were identified.    follow-up 2-3 wk with RN for BP recheck  she has been advised to call or return with any worsening or new problems     OK to review results via MyChart

## 2023-07-01 ENCOUNTER — Encounter (HOSPITAL_BASED_OUTPATIENT_CLINIC_OR_DEPARTMENT_OTHER): Payer: Exclusive Provider Organization | Admitting: Dermatology

## 2023-07-01 ENCOUNTER — Encounter (HOSPITAL_BASED_OUTPATIENT_CLINIC_OR_DEPARTMENT_OTHER): Payer: Self-pay

## 2023-07-04 ENCOUNTER — Other Ambulatory Visit (HOSPITAL_BASED_OUTPATIENT_CLINIC_OR_DEPARTMENT_OTHER): Payer: Self-pay

## 2023-07-04 ENCOUNTER — Encounter (HOSPITAL_BASED_OUTPATIENT_CLINIC_OR_DEPARTMENT_OTHER): Payer: Self-pay

## 2023-07-04 MED ORDER — NA SULFATE-K SULFATE-MG SULF 17.5-3.13-1.6 GM/177ML PO SOLN
354.0000 mL | ORAL | 0 refills | Status: DC
Start: 2023-07-04 — End: 2023-08-18

## 2023-07-04 NOTE — Progress Notes (Signed)
Spoke to patients husband with [place] inter[place] to confirm their appt 07/15/23 955A SH      .  Patient aware they need a ride, Patient suprep and DM  Instructions  reviewed and sent to mychart and Colon Prep to [facility] [phone] - [place], [place] - [address] [phone]       Patient does not take any anticoagulants, GLP-1 or SGLT-2

## 2023-07-07 ENCOUNTER — Encounter (HOSPITAL_BASED_OUTPATIENT_CLINIC_OR_DEPARTMENT_OTHER): Payer: Self-pay

## 2023-07-07 NOTE — Progress Notes (Signed)
Spoke c/ husband re: 1/24 colonoscopy arrival 0955-- explain she needs to have her instructions and read them thoroughly, pick up prep, and arrange ride. Told him to please have her call back c/ any questions.

## 2023-07-08 ENCOUNTER — Telehealth (HOSPITAL_BASED_OUTPATIENT_CLINIC_OR_DEPARTMENT_OTHER): Payer: Self-pay | Admitting: Emergency Medicine

## 2023-07-08 NOTE — Progress Notes (Signed)
Unable to reach patient at this time. Left message for appointment on 1/24 at 0955. Call back number provided for any questions.

## 2023-07-15 ENCOUNTER — Other Ambulatory Visit (HOSPITAL_BASED_OUTPATIENT_CLINIC_OR_DEPARTMENT_OTHER): Payer: Self-pay | Admitting: Internal Medicine

## 2023-07-15 ENCOUNTER — Encounter (HOSPITAL_BASED_OUTPATIENT_CLINIC_OR_DEPARTMENT_OTHER): Payer: Self-pay

## 2023-07-15 ENCOUNTER — Ambulatory Visit
Admission: RE | Admit: 2023-07-15 | Discharge: 2023-07-15 | Disposition: A | Discharge status: Home / Self Care | Payer: Exclusive Provider Organization | Attending: Gastroenterology | Admitting: Gastroenterology

## 2023-07-15 ENCOUNTER — Other Ambulatory Visit: Payer: Self-pay

## 2023-07-15 DIAGNOSIS — D509 Iron deficiency anemia, unspecified: Secondary | ICD-10-CM | POA: Diagnosis not present

## 2023-07-15 DIAGNOSIS — E611 Iron deficiency: Secondary | ICD-10-CM | POA: Diagnosis present

## 2023-07-15 DIAGNOSIS — D123 Benign neoplasm of transverse colon: Secondary | ICD-10-CM | POA: Diagnosis not present

## 2023-07-15 DIAGNOSIS — K5730 Diverticulosis of large intestine without perforation or abscess without bleeding: Secondary | ICD-10-CM

## 2023-07-15 MED ORDER — FENTANYL CITRATE 0.05 MG/ML IJ SOLN
INTRAMUSCULAR | Status: AC
Start: 2023-07-15 — End: 2023-07-15
  Filled 2023-07-15: qty 2

## 2023-07-15 MED ORDER — MIDAZOLAM HCL 2 MG/2 ML IJ SOLN
INTRAMUSCULAR | Status: AC
Start: 2023-07-15 — End: 2023-07-15
  Filled 2023-07-15: qty 4

## 2023-07-15 MED ORDER — MIDAZOLAM HCL 2 MG/2 ML IJ SOLN
INTRAMUSCULAR | Status: AC
Start: 2023-07-15 — End: 2023-07-15
  Filled 2023-07-15: qty 2

## 2023-07-15 MED ORDER — FENTANYL CITRATE 0.05 MG/ML IJ SOLN
INTRAMUSCULAR | Status: DC | PRN
Start: 2023-07-15 — End: 2023-07-15
  Administered 2023-07-15: 50 ug via INTRAVENOUS
  Administered 2023-07-15 (×2): 25 ug via INTRAVENOUS

## 2023-07-15 MED ORDER — MIDAZOLAM HCL 5 MG/5 ML IJ SOLN
INTRAMUSCULAR | Status: DC | PRN
Start: 2023-07-15 — End: 2023-07-15
  Administered 2023-07-15: 2 mg via INTRAVENOUS
  Administered 2023-07-15 (×3): 1 mg via INTRAVENOUS

## 2023-07-15 NOTE — H&P (Signed)
H&P NOTE     Chief Complaint:   Iron deficiency without anemia    PHI:  This 68 year old year old [place] speaking patient who presents for consideration of a diagnostic colonoscopy. [id number]    The patient denies any symptoms currently.  She has had no melena, hematochezia, abdominal pain or weight loss. Low iron. Normal hgb. Last colonoscopy was in 10 years ago which was normal. No TED. No cardiopulmonary symptoms. No OSA.    Abdominoplasty  No family history of colon cancer  +family history of colon polyps in mother  No blood thinners    Patient Active Problem List:     Overweight (BMI 25.0-29.9)     Vitamin D deficiency     Ovarian cyst     Allergic rhinitis     Renal anomaly     Pinguecula of both eyes     Allergic conjunctivitis     Hyperopia with astigmatism and presbyopia     History of Cushing disease     Osteoarthritis of both knees     Coccyx pain     Laryngopharyngeal reflux (LPR)     Palpitations     Goiter     S/P hysterectomy     History of hyperthyroidism     Epidermal cyst     Osteopenia of multiple sites     Neck pain, acute     Pain and numbness of left upper extremity     Cervical stenosis of spinal canal        Review of Patient's Allergies indicates:  No Known Allergies       Social History     Socioeconomic History    Marital status: Married     Spouse name: Not on file    Number of children: 0    Years of education: Not on file    Highest education level: Not on file   Occupational History     Employer: [employer]   Tobacco Use    Smoking status: Never    Smokeless tobacco: Never   Substance and Sexual Activity    Alcohol use: No    Drug use: No    Sexual activity: Yes     Partners: Male   Other Topics Concern    Military Service Not Asked    Blood Transfusions Not Asked    Caffeine Concern Not Asked    Occupational Exposure Not Asked    Hobby Hazards Not Asked    Sleep Concern Not Asked    Stress Concern No    Weight Concern Not Asked    Special Diet Not Asked     Back Care Not Asked    Exercise Yes    Bike Helmet Not Asked    Seat Belt Not Asked    Self-Exams Not Asked   Social History Narrative    Works with Public house manager, lives with husband, feels safe    From [place]    Arrived [place] 1985    No children        [name], MD, 02/27/2018    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)  No children           Social Drivers of Catering manager Strain: Not on file  Food Insecurity: Not on file  Transportation Needs: Not on file  Physical Activity: Not on file  Stress: Not on file  Social Connections: Not on file  Intimate Partner Violence: Not on file  Housing Stability: Not on file      Current Outpatient Medications   Medication Sig    Na Sulfate-K Sulfate-Mg Sulf (SUPREP) 17.5-3.13-1.6 GM/177ML SOLN Take 354 mLs by mouth See Admin Instructions Take as directed prior to colonoscopy.    polyethylene glycol (GLYCOLAX) 17 GM/SCOOP powder Take 17 g by mouth daily as needed    calcium carbonate + vitamin D (CALTRATE 600+D PLUS) 600-800 MG-UNIT TABS per tablet Take 2 tablets by mouth daily    naproxen (NAPROSYN) 500 MG tablet Take 1 tablet by mouth every 12 (twelve) hours as needed  for up to 7 days     No current facility-administered medications for this encounter.         Review of patient's family history indicates:  Problem: Osteoporosis      Relation: Mother          Age of Onset: (Not Specified)  Problem: Pulmonary      Relation: Mother          Age of Onset: (Not Specified)          Comment: COPD? smoker  Problem: VASCULAR      Relation: Father          Age of Onset: (Not Specified)          Comment: ? DVT  Problem: Psychiatric Illness      Relation: Brother          Age of Onset: (Not Specified)          Comment: depression  Problem:  Cancer - Breast      Relation: FamHxNeg          Age of Onset: (Not Specified)  Problem: Cancer - Colon      Relation: FamHxNeg          Age of Onset: (Not Specified)  Problem: Diabetes      Relation: FamHxNeg          Age of Onset: (Not Specified)  Problem: Heart      Relation: FamHxNeg          Age of Onset: (Not Specified)        REVIEW OF SYSTEMS:   Cardiovascular:  No chest pain, LE edema  Pulmonary:  No SOB or cough  Neuro:  No focal weakness, seizures    Physical Exam:  Temp:  [97.9 F (36.6 C)] 97.9 F (36.6 C)  Pulse:  [68] 68  Resp:  [21] 21  BP: (179)/(81) 179/81  Vital Signs: BP 179/81   Pulse 68   Temp 97.9 F (36.6 C) (Temporal)   Resp 21   Ht 5\' 1"  (1.549 m)   Wt 68 kg (150 lb)   SpO2 98%   BMI 28.34 kg/m  Body mass index is 28.34 kg/m.  General: Awake, alert, NAD  Pulmonary: Normal WOB, no wheezing  Cardiovascular: RRR, no murmurs  Abdominal: soft, NT/ND  Neuro: Nonfocal, moving all extremities symmetrically    Labs:  Lab Results   Component Value Date    NA 139 05/30/2023    K 4.0 05/30/2023    CL 103 05/30/2023    CO2 27  05/30/2023    BUN 11 05/30/2023    CREAT 0.7 05/30/2023    GLUCOSER 103 05/30/2023    ALBUMIN 4.1 05/11/2018    TBILI 0.2 05/11/2018    AST 19 05/11/2018    ALT 26 05/11/2018    ALKPHOS 125 (H) 05/11/2018     Lab Results   Component Value Date    WBC 7.2 06/30/2023    HCT 38.9 06/30/2023    PLTA 385 06/30/2023       ASA 2    Impression:  Iron deficiency without anemia    Medical Decision Making:  The patient will have a colonoscopy done. The patient was informed of the risk of bleeding, discomfort, perforation, a need for surgery, infection, drug reaction, cardiovascular and cerebrovascular compromise and the possibility of missing a lesion or problem.  She understood these risks and consented to the exam.     All questions were answered. The patient is in agreement with our plan. The patient will have conscious sedation anesthesia support during their procedure.        Seth Bake, MD, 07/15/2023

## 2023-07-15 NOTE — PROVATION-GI (Signed)
[facility]  Patient Name: Kayla Garza  MRN: [id number]  CSN: [id number]  Date of Birth: 21-Nov-1955  Admit Type: Outpatient  Age: 68  Gender: Female  Note Status: Finalized  Patient Location: [facility]  Referring MD:          Irine Seal, NP  Procedure Date:        07/15/2023 10:33:44 AM  Procedure:             Colonoscopy  Endoscopist:           Edsel Petrin MD, MD  Indications for Procedure:       Iron deficiency anemia  Medications:           Midazolam 5 mg IV, Fentanyl 100 micrograms IV  Procedure:       Just prior to the procedure, an updated history and physical was done. I        obtained an informed consent from the patient reviewing the risk of the        procedure including (but not limited to) respiratory depression, perforation,        bleeding, discomfort, a possible need for surgery and unexpected reactions to        medications. The patient is aware that test has limitations and may not        detect significant lesions such as cancer or other potential diseases. The        patient was also informed that they might need a repeat colonoscopy earlier        than standard guidelines if there are changes in their symptoms or concerning        findings noted. A time out was performed with the entire procedure staff        present. The scope was passed under direct vision. Throughout the procedure,        the patient's blood pressure, pulse, and oxygen saturations were monitored        continuously. The Colonoscope was introduced through the anus and advanced to        the sigmoid colon for evaluation. This was the intended extent. The scope was        then slowly withdrawn with confirmation of the noted findings. The quality of        the bowel preparation was evaluated using the BBPS [facility] Bowel Preparation        Scale) with scores of: Right Colon = 3, Transverse Colon = 3 and Left Colon =        3 (entire mucosa seen well with no residual staining, small fragments of        stool  or opaque liquid). The total BBPS score equals 9. The colonoscopy was        technically difficult and complex due to multiple diverticula in the colon        and the patient's discomfort during the procedure. Successful completion of        the procedure was aided by withdrawing the scope and replacing with the        pediatric colonoscope and applying abdominal pressure. The patient tolerated        the procedure. The [id number] was introduced through the anus and        advanced to the cecum, identified by appendiceal orifice and ileocecal valve.        The scope was then slowly withdrawn with confirmation of the noted findings.  Findings:  The perianal and digital rectal examinations were normal. Pertinent negatives        include normal sphincter tone.       Multiple small-mouthed diverticula were found in the sigmoid colon,        descending colon and ascending colon.       A 5 mm polyp was found in the transverse colon. The polyp was sessile. The        polyp was removed with a jumbo cold forceps. Resection and retrieval were        complete.       No additional abnormalities were found on retroflexion.       No source of iron deficiency found on lower GI.  Scope Withdrawal Time: 0 hours 4 minutes 0 seconds   Total Procedure Duration: 0 hours 23 minutes 43 seconds   Post Procedure Diagnosis:       - Diverticulosis in the sigmoid colon, in the descending colon and in the        ascending colon.       - One 5 mm polyp in the transverse colon, removed with a jumbo cold forceps.        Resected and retrieved.  Complications:         No immediate complications.  Estimated Blood Loss:  Estimated blood loss was minimal.  Recommendation:       - Await pathology results.       - High fiber diet.       - Repeat colonoscopy in 7-10 years for surveillance based on pathology        results with anesthesia and pediatric colonoscope.       - Perform an upper GI endoscopy at appointment to be scheduled.  Seth Bake, MD  Casimiro Needle Charlton Amor MD, MD  07/15/2023 11:23:05 AM  This report has been signed electronically.  Number of Addenda: 0  Note Initiated On: 07/15/2023 10:33 AM

## 2023-07-19 LAB — SURGICAL PATH SPECIMEN GASTROINTESTINAL

## 2023-07-20 ENCOUNTER — Ambulatory Visit
Admission: RE | Admit: 2023-07-20 | Discharge: 2023-07-20 | Disposition: A | Discharge status: Home / Self Care | Payer: Exclusive Provider Organization

## 2023-07-20 ENCOUNTER — Ambulatory Visit: Payer: Exclusive Provider Organization | Attending: Internal Medicine

## 2023-07-20 ENCOUNTER — Other Ambulatory Visit: Payer: Self-pay

## 2023-07-20 ENCOUNTER — Encounter (HOSPITAL_BASED_OUTPATIENT_CLINIC_OR_DEPARTMENT_OTHER): Payer: Self-pay

## 2023-07-20 VITALS — BP 172/92 | HR 76 | Temp 97.8°F | Wt 154.6 lb

## 2023-07-20 DIAGNOSIS — D500 Iron deficiency anemia secondary to blood loss (chronic): Secondary | ICD-10-CM | POA: Insufficient documentation

## 2023-07-20 DIAGNOSIS — E611 Iron deficiency: Secondary | ICD-10-CM | POA: Insufficient documentation

## 2023-07-20 LAB — CBC, PLATELET & DIFFERENTIAL
ABSOLUTE BASO COUNT: 0 10*3/uL (ref 0.0–0.1)
ABSOLUTE EOSINOPHIL COUNT: 0.1 10*3/uL (ref 0.0–0.8)
ABSOLUTE IMM GRAN COUNT: 0.01 10*3/uL (ref 0.00–0.10)
ABSOLUTE LYMPH COUNT: 2.2 10*3/uL (ref 0.6–5.9)
ABSOLUTE MONO COUNT: 0.4 10*3/uL (ref 0.2–1.4)
ABSOLUTE NEUTROPHIL COUNT: 2.8 10*3/uL (ref 1.6–8.3)
ABSOLUTE NRBC COUNT: 0 10*3/uL (ref 0.0–0.0)
BASOPHIL %: 0.4 % (ref 0.0–1.2)
EOSINOPHIL %: 0.9 % (ref 0.0–7.0)
HEMATOCRIT: 37.6 % (ref 34.1–44.9)
HEMOGLOBIN: 12 g/dL (ref 11.2–15.7)
IMMATURE GRANULOCYTE %: 0.2 % (ref 0.0–1.0)
LYMPHOCYTE %: 40 % (ref 15.0–54.0)
MEAN CORP HGB CONC: 31.9 g/dL (ref 31.0–37.0)
MEAN CORPUSCULAR HGB: 27 pg (ref 26.0–34.0)
MEAN CORPUSCULAR VOL: 84.5 fL (ref 80.0–100.0)
MEAN PLATELET VOLUME: 10.2 fL (ref 8.7–12.5)
MONOCYTE %: 7.2 % (ref 4.0–13.0)
NEUTROPHIL %: 51.3 % (ref 40.0–75.0)
NRBC %: 0 % (ref 0.0–0.0)
PLATELET COUNT: 300 10*3/uL (ref 150–400)
RBC DISTRIBUTION WIDTH STD DEV: 46.4 fL — ABNORMAL HIGH (ref 35.1–46.3)
RED BLOOD CELL COUNT: 4.45 M/uL (ref 3.90–5.20)
WHITE BLOOD CELL COUNT: 5.5 10*3/uL (ref 4.0–11.0)

## 2023-07-20 LAB — TOTAL IRON BINDING CAPACITY: TOTAL IRON BIND CAPACITY CALC: 445 ug/dL (ref 280–504)

## 2023-07-20 LAB — BASIC METABOLIC PANEL
ANION GAP: 10 mmol/L (ref 10–22)
BUN (UREA NITROGEN): 13 mg/dL (ref 7–18)
CALCIUM: 9.6 mg/dL (ref 8.5–10.5)
CARBON DIOXIDE: 28 mmol/L (ref 21–32)
CHLORIDE: 103 mmol/L (ref 98–107)
CREATININE: 0.8 mg/dL (ref 0.4–1.2)
ESTIMATED GLOMERULAR FILT RATE: >60 mL/min (ref >60)
Glucose Random: 91 mg/dL (ref 74–160)
POTASSIUM: 4.4 mmol/L (ref 3.5–5.1)
SODIUM: 140 mmol/L (ref 136–145)

## 2023-07-20 LAB — IRON: IRON: 45 ug/dL — ABNORMAL LOW (ref 50–170)

## 2023-07-20 LAB — FERRITIN: FERRITIN: 14 ng/mL (ref 13–150)

## 2023-07-20 MED ORDER — FERROUS SULFATE 324 (65 FE) MG PO TBEC
324.0000 mg | DELAYED_RELEASE_TABLET | Freq: Every day | ORAL | Status: DC
Start: 2023-07-21 — End: 2023-07-20

## 2023-07-20 NOTE — Progress Notes (Signed)
Hematology Initial Consult Note      Sanjuana Da Na Waldrip is a 68 year old female [place] female kindly referred for further evaluation of low iron.    The patient was in her [facility] until recently when she was being evaluated for fatigue and was found to have normal hemoglobin but  Low iron (ferritin 11, iron 53, TIBC 449). The patient subsequently underwent colonoscopy which revealed a tubular adenoma which was removed as well as extensive diverticulosis. The patient denies any melena, hematochezia, abdominal discomfort, bloating, change in bowel habits.  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  No children, married   No Etoh/tob      Most Recent Weight Reading(s)  07/20/23 : 70.1 kg (154 lb 9.6 oz)  07/15/23 : 68 kg (150 lb)  06/30/23 : 71.7 kg (158 lb)  05/26/23 : 72.1 kg (159 lb)  12/09/22 : 71.7 kg (158 lb)      Most Recent BP Reading(s)  07/20/23 : 171/90  07/15/23 : 130/87  06/30/23 : 171/78  05/26/23 : (!) 167/91  12/09/22 : 128/81    General: Well appearing, in NAD  HEENT: PERRLA, pink conjunctivae  Neck: Supple  CV: rrr, no rmg  Chest: CTA bilaterally  Abdomen: S, nt, nd  Extremities: No peripheral edema      Studies: Reviewed labs and imaging in Epic    Colonoscopy 07/15/23:  - Diverticulosis in the sigmoid colon, in the descending colon and in the ascending colon.       - One 5 mm polyp in the transverse colon, removed with a jumbo cold forceps.    ASSESSMENT & PLAN:  Lenzi Da Maple Odaniel is a 68 year old female [place]  female kindly referred for further evaluation of low iron likely due to chronic blood loss from diverticulosis    Low iron:   Plan: Will obtain CBC, PLATELET & DIFFERENTIAL, and repeat iron studies. If low would start on ferrous sulfate 325mg  po  qd with orange juice on empty stomach. Repeat labs before visit in 4 weeks.  Recommend eating food rich in fiber.    Fatigue: Unclear etiology. Unlikely related to low iron. Will defer further evaluation to PCP    Elevated BP without diagnosis of hypertension: Possibly "white coat"  Defer to PCP    >45 min were spent in direct encounter and coordination of care  Andrey Cota, MD, PhD

## 2023-07-21 ENCOUNTER — Ambulatory Visit (HOSPITAL_BASED_OUTPATIENT_CLINIC_OR_DEPARTMENT_OTHER): Payer: Exclusive Provider Organization | Admitting: Hand Surgery

## 2023-07-21 ENCOUNTER — Encounter (HOSPITAL_BASED_OUTPATIENT_CLINIC_OR_DEPARTMENT_OTHER): Payer: Self-pay | Admitting: Hand Surgery

## 2023-07-21 ENCOUNTER — Ambulatory Visit
Admission: RE | Admit: 2023-07-21 | Discharge: 2023-07-21 | Disposition: A | Discharge status: Home / Self Care | Payer: Exclusive Provider Organization | Attending: Student in an Organized Health Care Education/Training Program | Admitting: Student in an Organized Health Care Education/Training Program

## 2023-07-21 VITALS — BP 157/87 | HR 69 | Temp 96.8°F | Resp 18 | Ht 60.0 in | Wt 147.0 lb

## 2023-07-21 DIAGNOSIS — M25532 Pain in left wrist: Secondary | ICD-10-CM | POA: Insufficient documentation

## 2023-07-21 DIAGNOSIS — M6740 Ganglion, unspecified site: Secondary | ICD-10-CM | POA: Diagnosis present

## 2023-07-21 NOTE — Progress Notes (Signed)
I provided the patient with the following DME Left wrist splint in size medium    The product I provided fits perfectly   The patient demonstrated that they can properly apply the product   I have explained the product features and rationale   I advised that the patient contacts [place] with any questions or concerns.

## 2023-07-21 NOTE — Progress Notes (Signed)
HAND SURGERY NEW PATIENT VISIT    Cataleah, Stites, APRN    CC: left wrist pain  DX: left wrist dorsal ganglion    HPI- This is a 68 year old year old female who presents today for evaluation of left wrist pain x5 years  and a mass that appeared about 8 months ago, Summer 2024. No trauma.  She previously had some neck pain and radicular sx into the left hand but this has resolved. She saw physiatry and Dr Danella Deis for this in the past.( 10/2022)    No current numbness In left hand.  No treatment for this wrist pain.  She has not missed work due to the pain.    Past Medical History:  05/25/2012: Allergic conjunctivitis  No date: Breast lump      Comment:  1/00 Bx > fibroadenoma  2000: Helicobacter pylori (H. pylori)      Comment:  s/p treatment for 2 months  05/25/2012: Hyperopia with astigmatism and presbyopia  1999: Other and unspecified ovarian cyst  05/25/2012: Pinguecula of both eyes  Past Surgical History:  No date: BREAST BIOPSY      Comment:  negative right  needle Bx  1990: BRST RCNSTJ IMMT/DLYD W/TISS EXPANDER SBSQ XPNSJ      Comment:  s/p removal of excess skin  1980: CRANIEC TREPHINE BONE FLP BRAIN TUMOR SUPRTENTOR      Comment:  craniotomy age 89, tumor pituitary  1997: HYSTERECTOMY      Comment:  surgery for uterine fibroids. 7/01 Pap of  vag cuff                Negative; ovaries remain  No date: REDUCTION MAMMAPLASTY  1994: ROOT CANAL  Review of Patient's Allergies indicates:  No Known Allergies    PMH, Medications and allergies were all reviewed and updated in EPIC today.    Social History-housekeeping at [employer]  Social History    Tobacco Use      Smoking status: Never      Smokeless tobacco: Never    Alcohol use: No    Review of patient's family history indicates:  Problem: Osteoporosis      Relation: Mother          Age of Onset: (Not Specified)  Problem: Pulmonary      Relation: Mother          Age of Onset: (Not Specified)          Comment: COPD? smoker  Problem: VASCULAR      Relation: Father           Age of Onset: (Not Specified)          Comment: ? DVT  Problem: Psychiatric Illness      Relation: Brother          Age of Onset: (Not Specified)          Comment: depression  Problem: Cancer - Breast      Relation: FamHxNeg          Age of Onset: (Not Specified)  Problem: Cancer - Colon      Relation: FamHxNeg          Age of Onset: (Not Specified)  Problem: Diabetes      Relation: FamHxNeg          Age of Onset: (Not Specified)  Problem: Heart      Relation: FamHxNeg          Age of Onset: (Not Specified)    (  Not in a hospital admission)      Review of Systems:  Gen- Negative for fevers, chills, recent illnesses.  Patient Active Problem List:     Overweight (BMI 25.0-29.9)     Vitamin D deficiency     Ovarian cyst     Allergic rhinitis     Renal anomaly     Pinguecula of both eyes     Allergic conjunctivitis     Hyperopia with astigmatism and presbyopia     History of Cushing disease     Osteoarthritis of both knees     Coccyx pain     Laryngopharyngeal reflux (LPR)     Palpitations     Goiter     S/P hysterectomy     History of hyperthyroidism     Epidermal cyst     Osteopenia of multiple sites     Neck pain, acute     Pain and numbness of left upper extremity     Cervical stenosis of spinal canal        Physical Exam:   Interpreter- [place] phone ID # [phone] and [phone]  BP 157/87 (Site: LA, Position: Sitting, Cuff Size: Reg)   Pulse 69   Temp 96.8 F (36 C) (Temporal)   Resp 18   Ht 5' (1.524 m)   Wt 66.7 kg (147 lb)   SpO2 99%   BMI 28.71 kg/m   Pain Score: Data Unavailable          Gen-no apparent distress, alert and oriented 3    HEENT- head is normocephalic atraumatic, sclerae anicteric bilaterally    SKIN- skin is warm and dry to the touch.  No rashes, lesions, erythema or effusion noted at the left upper extremity but when the wrist is in flexion there is a 2 cm dorsal mass consistent with a dorsal ganglion at the left radiocarpal joint.    Musculoskeletal-  Patient is right handed.  RUE: Full  range of motion right wrist  No radiocarpal tenderness    LUE: Full range of motion left wrist and hand and is symmetric to the right side  When her wrist is in flexion a large ganglion is visible at the dorsal radiocarpal joint and she is tender just distal to that and a small sulcus.  She is exquisitely tender here  Negative Tinel left carpal tunnel  First dorsal compartment negative left wrist  Flexor tendon sheath throughout the left hand are all nontender  Left basal joint nontender  2+ radial pulse, sensation is intact in the median, radial and ulnar nerve distribution.    Imaging-3 views of the left wrist taken today show joint spaces to be preserved.  Soft tissue abnormality not appreciated.  No fracture.  All pertinent imaging was also reviewed by Dr. Olam Idler today.    A/P- This is a 68 year old year old female with a large dorsal left wrist ganglion.  The diagnosis and treatment options are explained in detail.  Patient is not interested in aspiration or injection today.  She would prefer conservative treatment using a wrist splint.  She is going to try to wear this is much as possible and definitely at night and she is going to follow-up with [place] in 2 months.  Will see her then.    This patient was seen and examined with Dr. Olam Idler today.  All of the patient's questions were answered.      Lorna Dibble, PA-C, 07/21/2023

## 2023-07-22 ENCOUNTER — Encounter (HOSPITAL_BASED_OUTPATIENT_CLINIC_OR_DEPARTMENT_OTHER): Payer: Self-pay | Admitting: Gastroenterology

## 2023-07-22 NOTE — Progress Notes (Signed)
Low vit D, pt already taking Ca-D. Continue. Reviewed via MyChart, in [place], [place], 07/22/2023

## 2023-07-22 NOTE — Progress Notes (Signed)
68 year old female with a left dorsal wrist ganglion.  We discussed treatment options including observation and splinting, aspiration, or surgical excision.  she would like to try using a splint for a couple of months to see if that helps.  Will see her back in 2 months to reevaluate this.  Please see the full note of Laurell Roof, [place].    MD ATTESTATION  I, Dr. Michela Pitcher, have seen and examined this patient on the date of service, confirmed key exam findings, reviewed any pertinent studies and images, and formulated the plan of care. I agree with the note as outlined above. I spent a substantive portion of time with the patient.    Resa Miner, MD, 07/21/2023

## 2023-08-02 ENCOUNTER — Encounter (HOSPITAL_BASED_OUTPATIENT_CLINIC_OR_DEPARTMENT_OTHER): Payer: Self-pay

## 2023-08-02 ENCOUNTER — Telehealth (HOSPITAL_BASED_OUTPATIENT_CLINIC_OR_DEPARTMENT_OTHER): Payer: Self-pay

## 2023-08-02 NOTE — Progress Notes (Signed)
Assisted by [place] translator [phone]. Spoke with patient by telephone. Patient's procedure for 08/19/23. Arrival time at 1045. Location: sh.  Ride home requirement reviewed.  Answered any questions patient had about their procedure. Patient asked to call [phone] with any further questions. Egd procedure instructions reviewed and placed into patient's MyChart. . Confirmed patient is not on anticoagulants GLP-1, or SGLT-2 diabetes medications.

## 2023-08-04 ENCOUNTER — Other Ambulatory Visit: Payer: Self-pay

## 2023-08-04 ENCOUNTER — Ambulatory Visit: Payer: Exclusive Provider Organization | Attending: Internal Medicine | Admitting: Family Medicine

## 2023-08-04 VITALS — BP 162/74 | HR 79

## 2023-08-04 DIAGNOSIS — I10 Essential (primary) hypertension: Secondary | ICD-10-CM | POA: Diagnosis present

## 2023-08-04 NOTE — Patient Instructions (Signed)
What is High Blood Pressure?    High blood pressure, also known as hypertension, is a serious medical condition that needs treatment.    Everyone has a blood pressure. Blood pressure is the force of blood pushing against   blood vessels as the heart pumps.    High blood pressure means that the pressure in your blood vessels is higher than they should be.     High blood pressure causes your heart to work too hard/over work.    High blood pressure often has no symptoms. The only way to know you have it is to measure your blood pressure.    Even with no symptoms, it can increase the risk of heart disease, stroke, kidney disease, blindness and other medical problems.    Medicines to lower blood pressure do not make it go away. They help treat it and prevent diseases high blood pressure can cause.    Causes of High Blood Pressure               No one knows exactly what causes most cases of high blood pressure.     Some people are at a higher risk to have high blood pressure than others. They are:      People with a close blood relative who has high blood   pressure  African Americans  People who aren't physically active   People who are overweight  People who eat too much salt   People who drink too much alcohol  People with diabetes, gout or kidney disease  Stress  Pregnant women  Women who take birth control pills  Women who had high blood pressure during pregnancy.  Age, the older you get the greater your chance of developing high blood pressure.    Lifestyle Changes    Cut back on alcohol    Alcohol includes beer, wine and hard liquor.    Men: Have no more than 2 drinks per day.  Women: Have no more than 1 drink per day.      A drink is:                            Lifestyle Changes cont.  Cut back on salt and packaged foods  Salt is in many foods. There is a lot of salt in canned foods and processed meats. Look for foods with 140 mg or less per serving.  Eat more fresh vegetables and fruits, and less processed foods  to help decrease your salt intake.            Lifestyle Changes cont.  Stop smoking / Stop chewing tobacco  Tobacco contains nicotine which is highly addictive.    Cigars, pipes, cigarettes, juul and hookahs produce smoke and raise your risk of serious health problems including lung and heart disease.    Chewing tobacco and vaping is also harmful.      Taking Your Blood Pressure at Home              Taking Your Blood Pressure at Home cont.

## 2023-08-04 NOTE — Progress Notes (Signed)
Hypertension Management Visit    SUBJECTIVE    Kayla Garza is a 68 year old female is currently being managed today for Hypertension    Copy and paste provider smartphrase with plan BP Check     Current medications for hypertension include:  None     Took BP meds today: NA    Patient reported adherence: NA    Side effects from current hypertension regimen: NA    Last BMP & A1c (A1c if appropriate)   GLUCOSE FASTING (mg/dL)   Date Value   56/21/3086 102     BUN (UREA NITROGEN) (mg/dL)   Date Value   57/84/6962 13     CALCIUM (mg/dL)   Date Value   95/28/4132 9.6     CHLORIDE (mmol/L)   Date Value   07/20/2023 103     CARBON DIOXIDE (mmol/L)   Date Value   07/20/2023 28     CREATININE (mg/dL)   Date Value   44/06/270 0.8     POTASSIUM (mmol/L)   Date Value   07/20/2023 4.4     SODIUM (mmol/L)   Date Value   07/20/2023 140        Self Monitoring of BP:   - The patient is using a digital machine: Yes  - Machines to be validated in clinic annually. Last validated in clinic on: NA  - If pt needs BP cuff order NA    Do they need a validation today: NA  *If yes, please insert BP validation SmartPhrase*    Signs/symptoms of hypotension: none reported    Signs/symptoms of hypertension: none reported    Social History:  - Current Smoking status: denies   - Current Alcohol use: denies    Diet:  - Daily caffeine intake: small cup x 2  - Daily sodium intake: no added salt    Exercise: Not currently active..    Patient education/resources   02/08 171/91   02/08 150/82  02/11 170/91  02/11 172/106  02/12 178/97  02/12 170/89  02/13 176/95  OBJECTIVE  Arm circumference for BP cuff size: 29 cm  Today's BP: 162/74 HR 79  * repeat X 2 if elevated   Most Recent BP Reading(s)  08/04/23 : 153/84  07/21/23 : 157/87  07/20/23 : (!) 172/92  07/15/23 : 130/87  06/30/23 : 171/78        Follow-up scheduled with PCP. Patient prefers in person.    Encouraged patient to keep scheduled visits and to call clinic with questions.

## 2023-08-11 ENCOUNTER — Other Ambulatory Visit: Payer: Self-pay

## 2023-08-11 ENCOUNTER — Encounter (HOSPITAL_BASED_OUTPATIENT_CLINIC_OR_DEPARTMENT_OTHER): Payer: Self-pay

## 2023-08-11 ENCOUNTER — Ambulatory Visit
Admission: RE | Admit: 2023-08-11 | Discharge: 2023-08-11 | Disposition: A | Discharge status: Home / Self Care | Payer: Exclusive Provider Organization | Attending: Internal Medicine | Admitting: Internal Medicine

## 2023-08-11 DIAGNOSIS — Z129 Encounter for screening for malignant neoplasm, site unspecified: Secondary | ICD-10-CM

## 2023-08-11 DIAGNOSIS — Z1231 Encounter for screening mammogram for malignant neoplasm of breast: Secondary | ICD-10-CM | POA: Insufficient documentation

## 2023-08-12 ENCOUNTER — Telehealth (HOSPITAL_BASED_OUTPATIENT_CLINIC_OR_DEPARTMENT_OTHER): Payer: Self-pay

## 2023-08-12 NOTE — Progress Notes (Signed)
 Call to patient, no answer, LVM in [place] confirming EGD appointment on 2/28.

## 2023-08-17 ENCOUNTER — Encounter (HOSPITAL_BASED_OUTPATIENT_CLINIC_OR_DEPARTMENT_OTHER): Payer: Self-pay

## 2023-08-17 ENCOUNTER — Other Ambulatory Visit: Payer: Self-pay

## 2023-08-17 ENCOUNTER — Ambulatory Visit: Payer: Exclusive Provider Organization | Attending: Internal Medicine

## 2023-08-17 VITALS — BP 164/93 | HR 82 | Temp 98.3°F | Wt 157.0 lb

## 2023-08-17 DIAGNOSIS — D508 Other iron deficiency anemias: Secondary | ICD-10-CM | POA: Diagnosis not present

## 2023-08-17 MED ORDER — FERROUS GLUCONATE 324 (38 FE) MG PO TABS
324.0000 mg | ORAL_TABLET | Freq: Every day | ORAL | Status: DC
Start: 2023-08-18 — End: 2023-08-17

## 2023-08-17 NOTE — Addendum Note (Signed)
 Addended by: Arvil Chaco on: 08/17/2023 04:01 PM     Modules accepted: Orders

## 2023-08-17 NOTE — Progress Notes (Signed)
 Hematology Initial Consult Note    CC; Iron deficiency    Kayla Garza is a 68 year old female [place] female kindly referred for further evaluation of low iron.    The patient was in her [facility] until recently when she was being evaluated for fatigue and was found to have normal hemoglobin but  Low iron (ferritin 11, iron 53, TIBC 449). The patient subsequently underwent colonoscopy which revealed a tubular adenoma which was removed as well as extensive diverticulosis. The patient denies any melena, hematochezia, abdominal discomfort, bloating, change in bowel habits.  She was referred to heme for further evaluation and management. 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  No children, married   No Etoh/tob      Most Recent Weight Reading(s)  08/11/23 : 66.7 kg (147 lb 0.8 oz)  07/21/23 : 66.7 kg (147 lb)  07/20/23 : 70.1 kg (154 lb 9.6 oz)  07/15/23 : 68 kg (150 lb)  06/30/23 : 71.7 kg (158 lb)      Most Recent BP Reading(s)  08/04/23 : 162/74  07/21/23 : 157/87  07/20/23 : (!) 172/92  07/15/23 : 130/87  06/30/23 : 171/78    General: Well appearing, in NAD  HEENT: PERRLA, pink conjunctivae  Neck: Supple  CV: rrr, no rmg  Chest: CTA bilaterally  Abdomen: S, nt, nd  Extremities: No peripheral edema      Studies: Reviewed labs and imaging in Epic    Colonoscopy 07/15/23:  - Diverticulosis in the sigmoid colon, in the descending colon and in the ascending colon.       - One 5 mm polyp in the transverse  colon, removed with a jumbo cold forceps.    ASSESSMENT & PLAN:  Kayla Garza is a 68 year old female [place] kindly referred for further evaluation of low iron likely due to chronic blood loss from diverticulosis    Low  iron:   Plan: Given persistent low iron levels on repeat iron studies we will start on ferrous sulfate 325mg  po qd with orange juice on empty stomach. Stool softeners OTC prn  Recommend eating food rich in fiber.    Fatigue: Unclear etiology. Unlikely related to low iron. Will defer further evaluation to PCP    Elevated BP without diagnosis of hypertension: Possibly "white coat"  Defer to PCP who she will meet tomorrow    Per patient request will delay follow up visit to May    >40 min were spent in direct encounter and coordination of care  Andrey Cota, MD, PhD

## 2023-08-18 ENCOUNTER — Encounter (HOSPITAL_BASED_OUTPATIENT_CLINIC_OR_DEPARTMENT_OTHER): Payer: Self-pay | Admitting: Internal Medicine

## 2023-08-18 ENCOUNTER — Ambulatory Visit: Payer: Exclusive Provider Organization | Attending: Internal Medicine | Admitting: Internal Medicine

## 2023-08-18 ENCOUNTER — Other Ambulatory Visit: Payer: Self-pay

## 2023-08-18 VITALS — BP 161/93 | HR 74 | Wt 158.0 lb

## 2023-08-18 DIAGNOSIS — E611 Iron deficiency: Secondary | ICD-10-CM | POA: Insufficient documentation

## 2023-08-18 DIAGNOSIS — I10 Essential (primary) hypertension: Secondary | ICD-10-CM | POA: Insufficient documentation

## 2023-08-18 MED ORDER — FERROUS SULFATE 325 (65 FE) MG PO TBEC
325.0000 mg | DELAYED_RELEASE_TABLET | Freq: Every day | ORAL | 2 refills | Status: DC
Start: 2023-08-18 — End: 2023-09-13

## 2023-08-18 MED ORDER — LISINOPRIL 5 MG PO TABS
5.0000 mg | ORAL_TABLET | Freq: Every day | ORAL | 2 refills | Status: DC
Start: 2023-08-18 — End: 2023-11-04

## 2023-08-18 MED ORDER — DOCUSATE SODIUM 100 MG PO CAPS
100.0000 mg | ORAL_CAPSULE | Freq: Three times a day (TID) | ORAL | 2 refills | Status: AC | PRN
Start: 2023-08-18 — End: 2023-11-16

## 2023-08-18 NOTE — Progress Notes (Signed)
 Kayla Garza is a 68 year old female, [place] for FU elevated BP    #) Bought BP monitor  Keeps log, that she shows me, and BP averages between 157/82 and 178/94  No chest pain, SOB, palpitations, lightheadedness    Most Recent BP Reading(s)  08/18/23 : (!) 161/93  08/17/23 : (!) 164/93  08/04/23 : 162/74  07/21/23 : 157/87  07/20/23 : (!) 172/92    #) Had appt with hematology yesterday. Recommended iron supplementation, but no rx sent    OBJECTIVE:  BP (!) 161/93   Pulse 74   Wt 71.7 kg (158 lb)   SpO2 100%   BMI 30.86 kg/m   Pleasant, in NAD  Heart: S1 and S2 normal, no murmurs, clicks, gallops or rubs. Regular rate and rhythm.   Lungs:  clear; no wheezes, rhonchi or rales.      ASSESSMENT & PLAN:  (I10) Primary hypertension  (primary encounter diagnosis)  Comment: BP has been elevated in the past 5 visits, and per home monitoring, confirming a dx of HTN  -- start lisinopril 5mg   -- continue checking BP at home, and keep long  -- referral to Clinical Pharm Team, bring BP monitor for calibration and BP log  Plan: lisinopril (ZESTRIL) 5 MG tablet, REFERRAL TO         [facility] ([facility])          (E61.1) Low iron  Comment:   Plan: ferrous sulfate 325 (65 FE) MG EC tablet,         docusate sodium (COLACE) 100 MG capsule          The patient was ready to learn and no apparent learning or adherence barriers were identified. I explained the diagnosis and treatment plan, and the patient expressed understanding of the content. I attempted to answer any questions regarding the diagnosis and the proposed treatment.    Possible side effects of the prescribed medication was explained. We discussed the patient's current medications.  We discussed the importance of medication compliance. The patient expressed understanding and no barriers to adherence were identified.    follow-up prn  she has been advised to call or return with any worsening or new problems

## 2023-08-19 ENCOUNTER — Ambulatory Visit
Admission: RE | Admit: 2023-08-19 | Discharge: 2023-08-19 | Disposition: A | Discharge status: Home / Self Care | Payer: Exclusive Provider Organization | Attending: Gastroenterology | Admitting: Gastroenterology

## 2023-08-19 ENCOUNTER — Encounter (HOSPITAL_BASED_OUTPATIENT_CLINIC_OR_DEPARTMENT_OTHER): Payer: Self-pay

## 2023-08-19 ENCOUNTER — Other Ambulatory Visit: Payer: Self-pay

## 2023-08-19 ENCOUNTER — Ambulatory Visit (HOSPITAL_BASED_OUTPATIENT_CLINIC_OR_DEPARTMENT_OTHER): Payer: Self-pay

## 2023-08-19 DIAGNOSIS — K2970 Gastritis, unspecified, without bleeding: Secondary | ICD-10-CM | POA: Insufficient documentation

## 2023-08-19 DIAGNOSIS — E611 Iron deficiency: Secondary | ICD-10-CM | POA: Insufficient documentation

## 2023-08-19 DIAGNOSIS — D509 Iron deficiency anemia, unspecified: Secondary | ICD-10-CM | POA: Diagnosis present

## 2023-08-19 DIAGNOSIS — K317 Polyp of stomach and duodenum: Secondary | ICD-10-CM | POA: Diagnosis present

## 2023-08-19 MED ORDER — PROPOFOL 500 MG/50 ML IV
Freq: Once | INTRAVENOUS | Status: DC | PRN
Start: 2023-08-19 — End: 2023-08-19
  Administered 2023-08-19: 150 mg via INTRAVENOUS

## 2023-08-19 MED ORDER — LIDOCAINE HCL (PF) 2 % IJ SOLN
Freq: Once | INTRAMUSCULAR | Status: DC | PRN
Start: 2023-08-19 — End: 2023-08-19
  Administered 2023-08-19: 60 mg via INTRAVENOUS

## 2023-08-19 MED ORDER — PROPOFOL INFUSION
INTRAVENOUS | Status: AC
Start: 2023-08-19 — End: 2023-08-19
  Filled 2023-08-19: qty 50

## 2023-08-19 MED ORDER — LIDOCAINE HCL (PF) 2 % IJ SOLN
INTRAMUSCULAR | Status: AC
Start: 2023-08-19 — End: 2023-08-19
  Filled 2023-08-19: qty 5

## 2023-08-19 NOTE — PROVATION-GI (Signed)
 [facility]  Patient Name: Kayla Garza  MRN: [id number]  CSN: [id number]  Date of Birth: [birth year]  Admit Type: Outpatient  Age: 68  Gender: Female  Note Status: Finalized  Patient Location: [facility]  Referring MD:          Irine Seal, NP  Procedure Date:        08/19/2023 11:06:49 AM  Procedure:             Upper GI endoscopy  Endoscopist:           Edsel Petrin MD, MD, Adaline Sill MD, MD  Indications for Procedure:       Iron deficiency anemia  Medications:           Monitored Anesthesia Care  Procedure:       Just prior to the procedure, an updated history and physical was done. I        obtained an informed consent from the patient reviewing the risk of the        procedure including (but not limited to) respiratory depression, perforation,        bleeding, discomfort, a possible need for surgery and unexpected reactions to        medications. The patient is aware that test has limitations and may not        detect significant lesions such as cancer or other potential diseases. The        patient was also informed that they might need a repeat upper endoscopy        earlier than standard guidelines if there are changes in their symptoms or        concerning findings noted. A time out was performed with the entire procedure        staff present. The scope was passed under direct vision. Throughout the        procedure, the patient's blood pressure, pulse, and oxygen saturations were        monitored continuously. The Endoscope was introduced through the mouth, and        advanced to the second part of duodenum. The upper GI endoscopy was        accomplished without difficulty. The patient tolerated the procedure well.  Findings:       The examined esophagus was normal.       Patchy mild inflammation characterized by erythema was found in the gastric        antrum. Biopsies were taken with a cold forceps for Helicobacter pylori        testing.       Multiple small sessile polyps with no  stigmata of recent bleeding were found        in the gastric body. Biopsies were taken with a cold forceps for histology.       The examined duodenum was normal. Biopsies for histology were taken with a        cold forceps for evaluation of celiac disease.       A small hiatal hernia was present.  Total Procedure Duration: 0 hours 2 minutes 48 seconds   Post Procedure Diagnosis:       - Normal esophagus.       - Gastritis. Biopsied.       - Multiple gastric polyps. Biopsied.       - Normal examined duodenum. Biopsied.       - Small hiatal hernia.  Complications:  No immediate complications.  Estimated Blood Loss:  Estimated blood loss was minimal.  Recommendation:       - Await pathology results.       - Continue iron replacement therapy       - Return to referring physician.  Seth Bake, MD  Casimiro Needle Charlton Amor MD, MD  08/19/2023 11:33:24 AM  This report has been signed electronically.  Adaline Sill MD, MD  Number of Addenda: 0  Note Initiated On: 08/19/2023 11:06 AM

## 2023-08-19 NOTE — H&P (Signed)
 H&P NOTE     Chief Complaint:   IDA    PHI:  This 68 year old year old [place] speaking patient who presents for consideration of a diagnostic EGD. [id number]     The patient denies any symptoms currently.  She has had no melena, hematochezia, abdominal pain or weight loss. Low iron. Normal hgb. Recent colonoscopy was negative for etiology of iron deficiency. No TED. No cardiopulmonary symptoms. No OSA.     Abdominoplasty  No family history of colon cancer  +family history of colon polyps in mother  No blood thinners    Patient Active Problem List:     Overweight (BMI 25.0-29.9)     Vitamin D deficiency     Ovarian cyst     Allergic rhinitis     Renal anomaly     Pinguecula of both eyes     Allergic conjunctivitis     Hyperopia with astigmatism and presbyopia     History of Cushing disease     Osteoarthritis of both knees     Coccyx pain     Laryngopharyngeal reflux (LPR)     Palpitations     Goiter     S/P hysterectomy     History of hyperthyroidism     Epidermal cyst     Osteopenia of multiple sites     Neck pain, acute     Pain and numbness of left upper extremity     Cervical stenosis of spinal canal     Essential hypertension        Review of Patient's Allergies indicates:  No Known Allergies       Social History     Socioeconomic History    Marital status: Married     Spouse name: Not on file    Number of children: 0    Years of education: Not on file    Highest education level: Not on file   Occupational History     Employer: [employer]   Tobacco Use    Smoking status: Never    Smokeless tobacco: Never   Substance and Sexual Activity    Alcohol use: No    Drug use: No    Sexual activity: Yes     Partners: Male   Other Topics Concern    Military Service Not Asked    Blood Transfusions Not Asked    Caffeine Concern Not Asked    Occupational Exposure Not Asked    Hobby Hazards Not Asked    Sleep Concern Not Asked    Stress Concern No    Weight Concern Not Asked    Special Diet Not Asked     Back Care Not Asked    Exercise Yes    Bike Helmet Not Asked    Seat Belt Not Asked    Self-Exams Not Asked   Social History Narrative    Works with Public house manager, lives with husband, feels safe    From [place]    Arrived [place] 1985    No children        [name], MD, 02/27/2018    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 Lives  with hsub (retired)    No children           Social Drivers of Catering manager Strain: Not on file  Food Insecurity: Not on file  Transportation Needs: Not on file  Physical Activity: Not on file  Stress: Not on file  Social Connections: Not on file  Intimate Partner Violence: Not on file  Housing Stability: Not on file      Current Outpatient Medications   Medication Sig    lisinopril (ZESTRIL) 5 MG tablet Take 1 tablet by mouth daily    ferrous sulfate 325 (65 FE) MG EC tablet Take 1 tablet by mouth daily    docusate sodium (COLACE) 100 MG capsule Take 1 capsule by mouth 3 (three) times daily as needed for Constipation    polyethylene glycol (GLYCOLAX) 17 GM/SCOOP powder Take 17 g by mouth daily as needed    calcium carbonate + vitamin D (CALTRATE 600+D PLUS) 600-800 MG-UNIT TABS per tablet Take 2 tablets by mouth daily    naproxen (NAPROSYN) 500 MG tablet Take 1 tablet by mouth every 12 (twelve) hours as needed  for up to 7 days     No current facility-administered medications for this encounter.         Review of patient's family history indicates:  Problem: Osteoporosis      Relation: Mother          Age of Onset: (Not Specified)  Problem: Pulmonary      Relation: Mother          Age of Onset: (Not Specified)          Comment: COPD? smoker  Problem: VASCULAR      Relation: Father          Age of Onset: (Not Specified)          Comment: ? DVT  Problem: Psychiatric Illness       Relation: Brother          Age of Onset: (Not Specified)          Comment: depression  Problem: Cancer - Breast      Relation: FamHxNeg          Age of Onset: (Not Specified)  Problem: Cancer - Colon      Relation: FamHxNeg          Age of Onset: (Not Specified)  Problem: Diabetes      Relation: FamHxNeg          Age of Onset: (Not Specified)  Problem: Heart      Relation: FamHxNeg          Age of Onset: (Not Specified)        REVIEW OF SYSTEMS:   Cardiovascular:  No chest pain, LE edema  Pulmonary:  No SOB or cough  Neuro:  No focal weakness, seizures    Physical Exam:  Temp:  [98.7 F (37.1 C)] 98.7 F (37.1 C)  Pulse:  [81] 81  Resp:  [16] 16  BP: (186)/(87) 186/87  Vital Signs: BP 186/87   Pulse 81   Temp 98.7 F (37.1 C) (Temporal)   Resp 16   Ht 5\' 4"  (1.626 m)   Wt 71.2 kg (157 lb)   SpO2 98%   BMI 26.95 kg/m  Body mass index is 26.95 kg/m.  General: Awake, alert, NAD  Pulmonary: Normal WOB, no wheezing  Cardiovascular: RRR, no murmurs  Abdominal: soft, NT/ND  Neuro: Nonfocal, moving all extremities symmetrically  Labs:  Lab Results   Component Value Date    NA 140 07/20/2023    K 4.4 07/20/2023    CL 103 07/20/2023    CO2 28 07/20/2023    BUN 13 07/20/2023    CREAT 0.8 07/20/2023    GLUCOSER 91 07/20/2023    ALBUMIN 4.1 05/11/2018    TBILI 0.2 05/11/2018    AST 19 05/11/2018    ALT 26 05/11/2018    ALKPHOS 125 (H) 05/11/2018     Lab Results   Component Value Date    WBC 5.5 07/20/2023    HCT 37.6 07/20/2023    PLTA 300 07/20/2023       ASA 2    Impression:  IDA    Medical Decision Making:  The patient will have a diagnostic EGD done. The patient was informed of the risk of bleeding, discomfort, perforation, a need for surgery, infection, drug reaction, cardiovascular and cerebrovascular compromise and the possibility of missing a lesion or problem.  She understood these risks and consented to the exam.     All questions were answered. The patient is in agreement with our plan. The patient  will have MAC anesthesia support during their procedure.     Seth Bake, MD, 08/19/2023

## 2023-08-19 NOTE — Anesthesia Postprocedure Evaluation (Signed)
 Anesthesia Post-Operative Evaluation Note    Patient: Kayla Garza           Procedure Summary       Date: 08/19/23 Room / Location: [facility]    Anesthesia Start: 1122 Anesthesia Stop:     Procedure: EGD Diagnosis: Low iron    Scheduled Providers: Seth Bake, MD; Adaline Sill, MD Responsible Provider: Adaline Sill, MD    Anesthesia Type: general, TIVA ASA Status: 2              POST-OPERATIVE EVALUATION    Anesthesia Post Evaluation    Vitals signs in patient's normal range: Yes  Respiratory function stable; airway patent: Yes  Cardiovascular function stable: Yes  Hydration status stable: Yes  Mental status recovered; patient participates in evaluation and/or is at baseline: Yes  Pain control satisfactory: Yes  Nausea and vomiting control satisfactory: YesProcedure was labor & delivery no  PostOP disposition PACU    MIPS#404 Anesthesiology Smoking Abstinence:     The patient is a current smoker (e.g. cigarette, cigar, pipe, e-cigarette/vaping/marijuana) (UR427): No   FOR CODING USE ONLY: IF BLANK C6237    MIPS#477 Multimodal Pain Management:  Emergent - Exclusion case: No  Patient was administered multimodal pain management (two or more drugs and/or interventions excluding systemic opioids) in the perioperative period; occurring at some time between 6 hours prior to anesthesia start time until discharged from: No   List medical reason(s) patient did not receive multimodal pain management (S2831): no pain anticipated postop  FOR CODING USE ONLY: REASON NOT LISTED D1761    YWVP#710 Perioperative Temperature Management:  Anesthesia start to Anesthesia end time was 60 minutes or longer (4256F): No   FOR CODING USE ONLY: REASON NOT LISTED G2694    MIPS#430 Adult Prevention of PONV:  Patient received an inhalational anesthetic (WN462): No  FOR CODING USE ONLY: REASON NOT LISTED V0350    MIPS#463 Pediatric Prevention of PONV:  Pediatric patient?: No  FOR CODING USE ONLY: REASON NOT  LISTED K9381        [employer] # [id number]          Last vitals    BP: 186/87 (08/19/2023 10:53 AM)  Temp: 98.7 F (37.1 C) (08/19/2023 10:53 AM)  Pulse: 81 (08/19/2023 10:53 AM)  Resp: 24 (08/19/2023 11:29 AM)  SpO2: 98 % (08/19/2023 10:53 AM)

## 2023-08-19 NOTE — Anesthesia Preprocedure Evaluation (Signed)
 Pre-Anesthetic Note  .      Patient: Kayla Garza is a 68 year old female      Procedure Information       Date/Time: 08/19/23 1130    Scheduled providers: Seth Bake, MD; Adaline Sill, MD    Procedure: EGD    Diagnosis: Low iron [E61.1]    Location: [facility]            Relevant Problems   CARDIO   (+) Essential hypertension      GU/RENAL   (+) Renal anomaly           Previous Anesthetic History:   Past Surgical History:  No date: BREAST BIOPSY      Comment:  negative right  needle Bx  1990: BRST RCNSTJ IMMT/DLYD W/TISS EXPANDER SBSQ XPNSJ      Comment:  s/p removal of excess skin  1980: CRANIEC TREPHINE BONE FLP BRAIN TUMOR SUPRTENTOR      Comment:  craniotomy age 71, tumor pituitary  1997: HYSTERECTOMY      Comment:  surgery for uterine fibroids. 7/01 Pap of  vag cuff                Negative; ovaries remain  No date: REDUCTION MAMMAPLASTY  1994: ROOT CANAL        Medications  Current Outpatient Medications   Medication Sig    lisinopril (ZESTRIL) 5 MG tablet Take 1 tablet by mouth daily    ferrous sulfate 325 (65 FE) MG EC tablet Take 1 tablet by mouth daily    docusate sodium (COLACE) 100 MG capsule Take 1 capsule by mouth 3 (three) times daily as needed for Constipation    polyethylene glycol (GLYCOLAX) 17 GM/SCOOP powder Take 17 g by mouth daily as needed    calcium carbonate + vitamin D (CALTRATE 600+D PLUS) 600-800 MG-UNIT TABS per tablet Take 2 tablets by mouth daily    naproxen (NAPROSYN) 500 MG tablet Take 1 tablet by mouth every 12 (twelve) hours as needed  for up to 7 days     No current facility-administered medications for this encounter.         Allergies:   Review of Patient's Allergies indicates:  No Known Allergies    Smoking, Alcohol, Drugs:  Social History    Tobacco Use      Smoking status: Never      Smokeless tobacco: Never    Alcohol use: No      Drug use:   No       PMHx:  Past Medical History:  05/25/2012: Allergic conjunctivitis  No date: Breast  lump      Comment:  1/00 Bx > fibroadenoma  2000: Helicobacter pylori (H. pylori)      Comment:  s/p treatment for 2 months  05/25/2012: Hyperopia with astigmatism and presbyopia  1999: Other and unspecified ovarian cyst  05/25/2012: Pinguecula of both eyes    Vitals  Ht 5\' 4"  (1.626 m)   Wt 71.2 kg (157 lb)   BMI 26.95 kg/m     Review of Systems     Patient summary reviewed      Anesthetic History:   negative anesthesia history ROS           Cardiovascular:  Positive for hypertension. Negative for chest pain, acute myocardial infarction and shortness of breath.   Pulmonary: Positive for cough (dry cough started today, states due to dry throat). Negative for tobacco use.  GU/Renal: Negative for GU/renal diseases.    Hepatic: Skin negative for hepatic disease.    Neurological:  Positive for headaches.   Gastrointestinal:  Positive for GERD.   Endocrine: Positive for hyperthyroidism.   Musculoskeletal:  Positive for arthralgias.   Psychiatric:  Negative for substance use.        Physical Exam:     General     Level of consciousness:  Alert and oriented (time, person, place)   no respiratory distress syndrome     BMI       Airway     Mallampati:  II    TM distance:  >3 FB    Mouth opening:  >3 FB    Neck ROM:  Full     Teeth    (+) upper dentures    Comments: Full upper dentures}     Heart  - normal exam       Lungs - normal exam          HEENT: normal exam     Abdomen:                            Pertinent Labs:   Lab Results   Component Value Date    NA 140 07/20/2023    K 4.4 07/20/2023    CREAT 0.8 07/20/2023    GLUCOSER 91 07/20/2023    WBC 5.5 07/20/2023    HCT 37.6 07/20/2023    PLTA 300 07/20/2023       EKG:       PFTs:   No results found for this or any previous visit.      Anesthesia Plan    ASA Score:     ASA:  2    Airway:      Mallampati:  II    Mouth opening:  >3 FB    Neck ROM:  Full    TM distance:  >3 FB     Plan: general and TIVA    Other information:     Full Stomach Precaution:: No       Post-Plan::  PACU    Anesthesia Assessment and Plan:        Interpreter [phone]     Informed Consent:     Anesthetic plan and risks discussed with:  Patient   Patient Consented        Attending Anesthesiologist Statement:     Reassessed day of surgery: Yes        Assessment made, necessary equipment and appropriate plan in place.

## 2023-08-24 LAB — SURGICAL PATH SPECIMEN GASTROINTESTINAL

## 2023-08-25 ENCOUNTER — Ambulatory Visit: Payer: Exclusive Provider Organization | Attending: Internal Medicine | Admitting: Pharmacist

## 2023-08-25 VITALS — BP 131/84 | HR 82 | Temp 97.5°F | Wt 156.0 lb

## 2023-08-25 DIAGNOSIS — I10 Essential (primary) hypertension: Secondary | ICD-10-CM | POA: Insufficient documentation

## 2023-08-25 LAB — BUN (UREA NITROGEN): BUN (UREA NITROGEN): 13 mg/dL (ref 7–18)

## 2023-08-25 LAB — POTASSIUM: POTASSIUM: 4.6 mmol/L (ref 3.5–5.1)

## 2023-08-25 LAB — CREATININE: CREATININE: 0.8 mg/dL (ref 0.4–1.2)

## 2023-08-25 NOTE — Progress Notes (Signed)
 Pharmacotherapy Hypertension Management Visit     Translator used for this visit: Yes, [place].    Kayla Garza is a 68 year old female who was referred on 08/18/23 to pharmacotherapy for hypertension management.  The patient brought her BP monitor for validation, however validation was not completed due to dead batteries.    Medication reconciliation was completed at the beginning of today's visit. Patient reports (+) adherence to all medications including lisinopril 5 mg daily which was prescribed on 08/18/23.    Medications taken prior to visit: Yes    Sx/Complaints: patient denies chest pain, swelling, dizziness, blurred vision, headache, shortness of breath, or any other symptoms.     Diet: adds salt when preparing meals, but does not add additional salt when eating meals    Exercise: purchased a home elliptical machine this week and her goal to is to start exercising daily.     BP Home Monitoring:  checks daily and reports a systolic range of 131-155.  Monitor was last validated on: never        Electrolytes  Lab Results   Component Value Date    NA 140 07/20/2023    K 4.4 07/20/2023    CL 103 07/20/2023    CA 9.6 07/20/2023     Kidney Function  Lab Results   Component Value Date    BUN 13 07/20/2023    CREAT 0.8 07/20/2023    GFR > 60 07/20/2023           Blood Pressure    Most Recent BP Reading(s)  08/25/23 : 131/84  08/19/23 : 144/85  08/18/23 : (!) 161/93      Heart Rate  Most Recent Pulse Reading(s)  08/25/23 : 82  08/19/23 : 66  08/18/23 : 74                 ASSESSMENT/PLAN  Blood pressure is near goal (<130/80 per 2023 ADA guidelines or 2017 ACC/AHA guidelines)  Interventions: discussed increasing the lisinopril, however the patient prefers to work on lifestyle modifications until follow up. Therefore, the plan is to continue lisinopril 5 mg daily.    Labs needed: potassium, creatinine, bun due to starting lisinopril  Will discuss lab results by 3/14 or sooner if clinically  indicated.      Education topics covered:  - Importance of medication and visit compliance  - Risks associated with elevated BP  - Diet and exercise as a means of controlling HTN  - Reviewed how to properly use BP monitor      Planned return date: 11/04/23 - the patient is traveling [place] and plans to return on 10/29/23       Instructed patient to call the clinic or 911 or go to the ER if they are experiencing unusual symptoms.    Patient verbalized understanding of everything discussed and agrees with plan. Patient was given the opportunity to ask questions/voice concerns.    Majority of this visit was spent counseling the patient regarding medication management and chronic disease education.    Pharmacotherapy tracking for: Bill By Complexity/Medical Decision Making (MDM), Hypertension/Elevated BP (MDM), and Medication Reconciliation/Management (Time)

## 2023-09-02 NOTE — Progress Notes (Signed)
Informed patient labs are within normal limits and to continue all meds as instructed.

## 2023-09-05 ENCOUNTER — Encounter (HOSPITAL_BASED_OUTPATIENT_CLINIC_OR_DEPARTMENT_OTHER): Payer: Self-pay | Admitting: Gastroenterology

## 2023-09-08 ENCOUNTER — Ambulatory Visit: Payer: Exclusive Provider Organization | Attending: Hand Surgery | Admitting: Hand Surgery

## 2023-09-08 ENCOUNTER — Other Ambulatory Visit: Payer: Self-pay

## 2023-09-08 DIAGNOSIS — M67432 Ganglion, left wrist: Secondary | ICD-10-CM | POA: Insufficient documentation

## 2023-09-08 NOTE — Progress Notes (Signed)
 FOLLOW UP NOTE    Interpreter: A [place] interpreter was used today via telephone.  PCP: Irine Seal, APRN      CC: Ganglion cyst left dorsal wrist  DX: Ganglion cyst left dorsal wrist    Interim History:   -This is a 68year old female presenting today for follow up.   -I last saw her on 07/21/2023 to discuss the left dorsal wrist ganglion.  -At that time she wanted to just watch it and see if it improved  -At this time it has persisted and is bothersome to her and she would like to have treatment.    Occupation: Works at [employer] & Allergies: Reviewed and updated in epic.    Physical Exam:  General: Well appearing female in NAD, alert and oriented, cooperative  HEENT: NCAT, EOMI, sclerae anicteric bilaterally  Upper extremity:  -Left wrist extension/flexion 50/40  -2 x 2 cm dorsal ganglion cyst.  -Neurovascular status: radial, median, ulnar nerves intact   -Skin is warm and dry without rashes, lesions or abrasions  -Capillary refill is brisk  -Radial pulses: 2+ bilaterally    Imaging (x-ray): Radiographs of the left wrist are unremarkable.  Radiographs were visualized and interpreted by me.    Assessment/plan: Ganglion cyst.  We again discussed treatment options and she would like to have this surgically excised.  She would like to do that in the fall.  Plan is to book an appointment in August to book surgery for September  Should stay out of work for one month after surgery          Resa Miner, MD, 09/08/2023

## 2023-09-13 ENCOUNTER — Encounter (HOSPITAL_BASED_OUTPATIENT_CLINIC_OR_DEPARTMENT_OTHER): Payer: Self-pay | Admitting: Internal Medicine

## 2023-09-13 DIAGNOSIS — E611 Iron deficiency: Secondary | ICD-10-CM

## 2023-09-13 NOTE — Telephone Encounter (Signed)
 PER Patient (self), Kayla Garza is a 68 year old female has requested a refill of ferrous sulfate.      Last OFFICE/TELE Visit:  08/18/23 with Babs Sciara      Last Physical Exam:   02/12/2021     There are no preventive care reminders to display for this patient.    Other Med Adult:  Most Recent BP Reading(s)  08/25/23 : 131/84        Cholesterol (mg/dL)   Date Value   09/81/1914 228     LOW DENSITY LIPOPROTEIN DIRECT (mg/dL)   Date Value   78/29/5621 140     HIGH DENSITY LIPOPROTEIN (mg/dL)   Date Value   30/86/5784 71     TRIGLYCERIDES (mg/dL)   Date Value   69/62/9528 93         THYROID SCREEN TSH REFLEX FT4 (uIU/mL)   Date Value   05/30/2023 3.360         TSH (THYROID STIM HORMONE) (uIU/mL)   Date Value   05/17/2017 2.080       HEMOGLOBIN A1C (%)   Date Value   05/30/2023 5.8 (H)       No results found for: "POCA1C"      No results found for: "INR"    SODIUM (mmol/L)   Date Value   07/20/2023 140       POTASSIUM (mmol/L)   Date Value   08/25/2023 4.6           CREATININE (mg/dL)   Date Value   41/32/4401 0.8       Documented patient preferred pharmacies:    [id number] - [place], [place] - [address]  Phone: [phone] Fax: [phone]

## 2023-09-14 MED ORDER — FERROUS SULFATE 325 (65 FE) MG PO TBEC
325.0000 mg | DELAYED_RELEASE_TABLET | Freq: Every day | ORAL | 2 refills | Status: DC
Start: 2023-09-14 — End: 2023-11-24

## 2023-10-13 IMAGING — MR JOELHO ESQ
4 of 7 series · 11 of 40 positions shown · non-contrast
Comparison: none

[Series 1: loc axi · axial · 5.0mm · 1.88mm/px · z∈[-30,+30]mm · 2 of 5 slices shown]
[im 1/5]
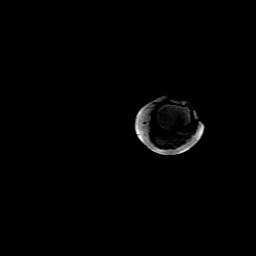
[im 5/5]
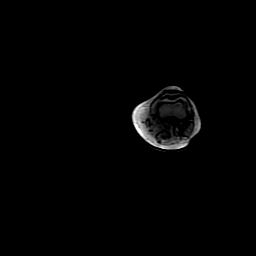

[Series 2: 3p loc airx · axial · 7.0mm · 0.59mm/px · z∈[-30,+150]mm · 3 of 28 slices shown]
[im 4/28]
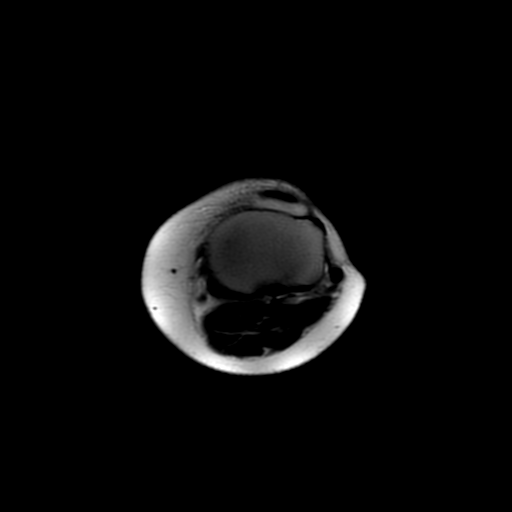
[im 14/28]
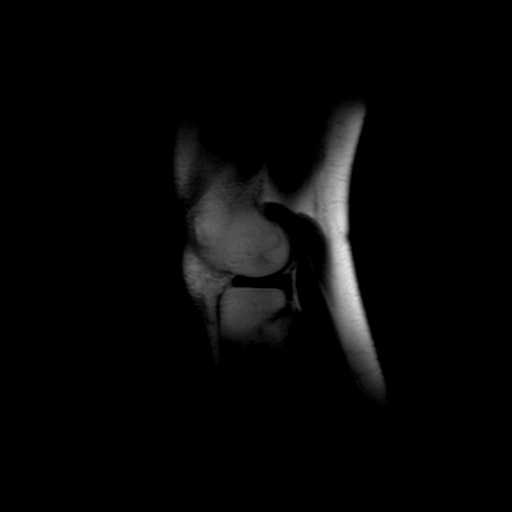
[im 24/28]
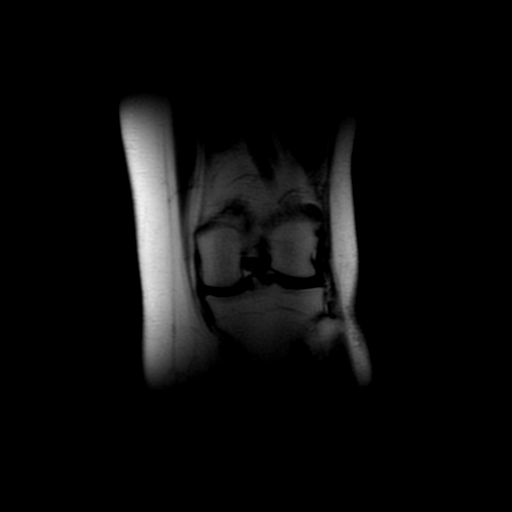

[Series 3: T1 · sagittal · 4.0mm · 0.17mm/px · 3 of 20 slices shown]
[im 4/20]
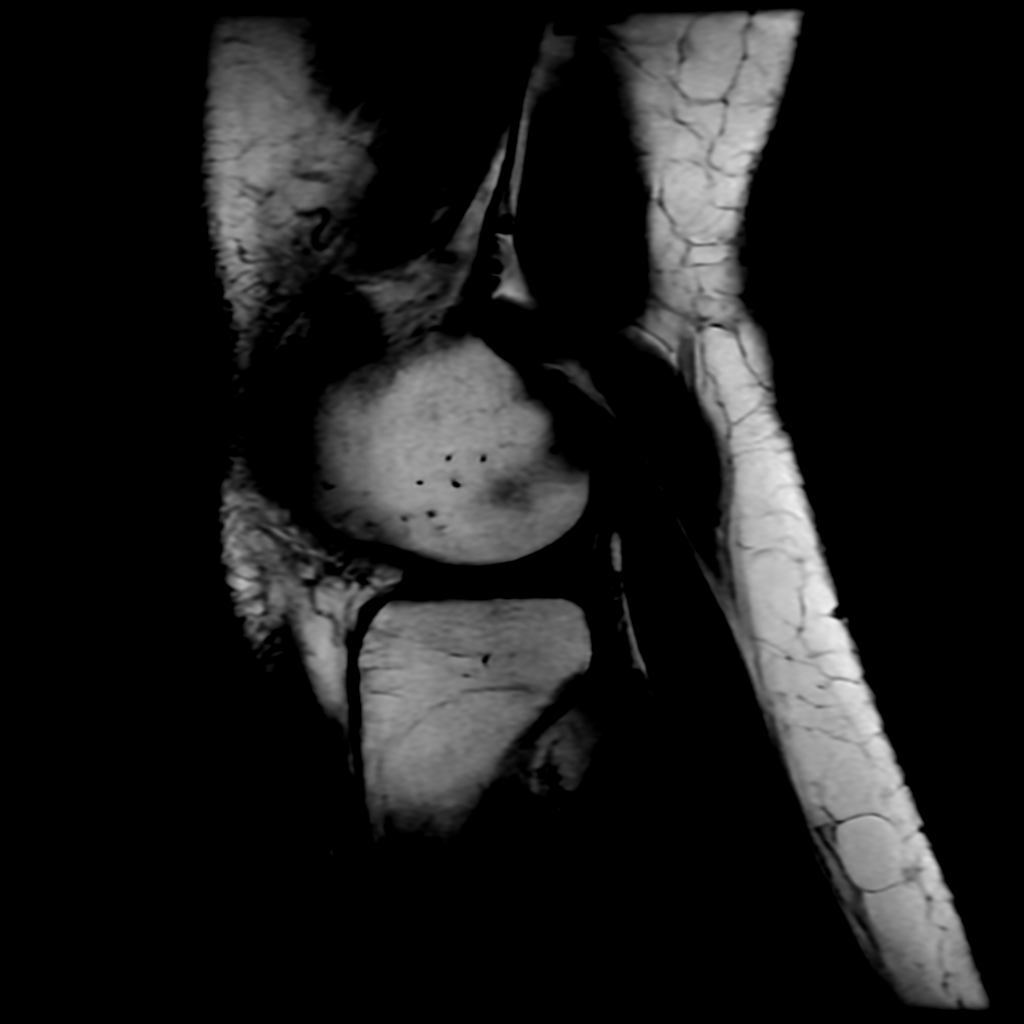
[im 12/20]
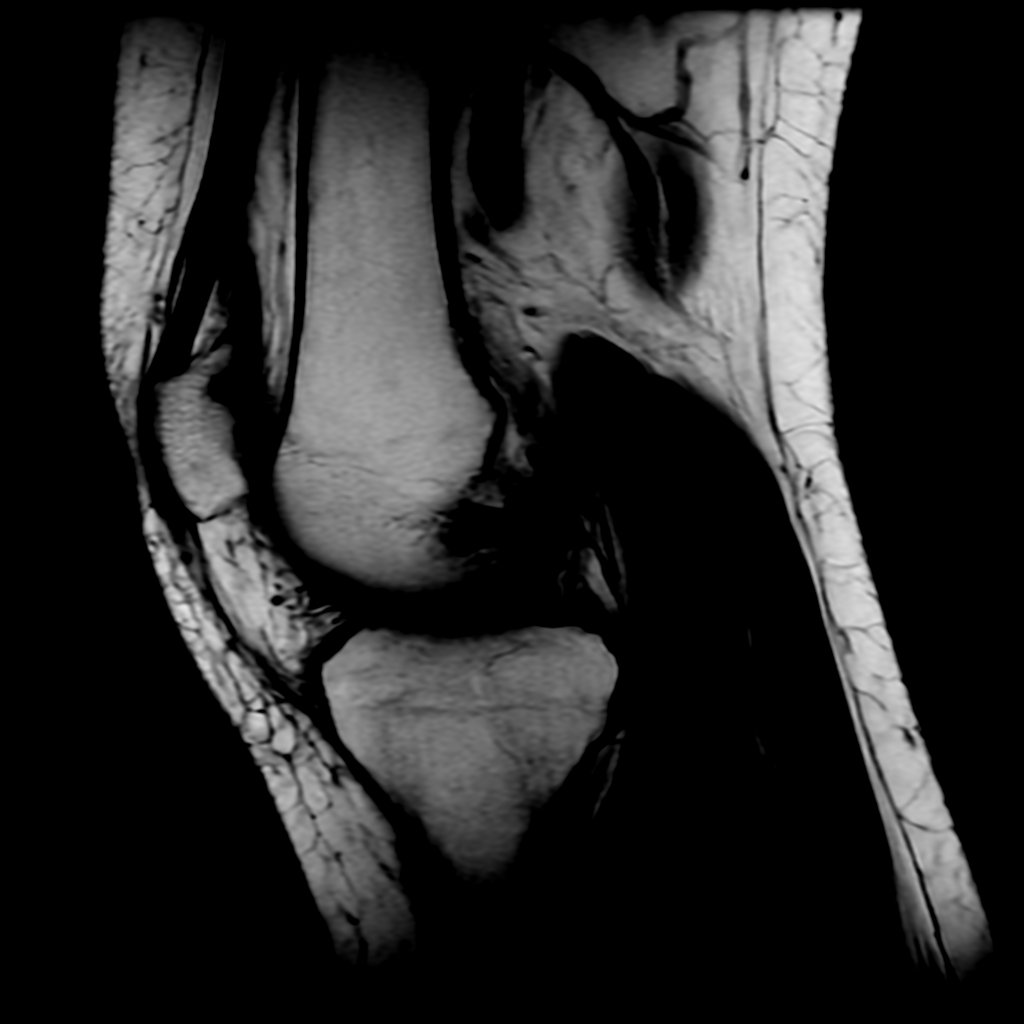
[im 20/20]
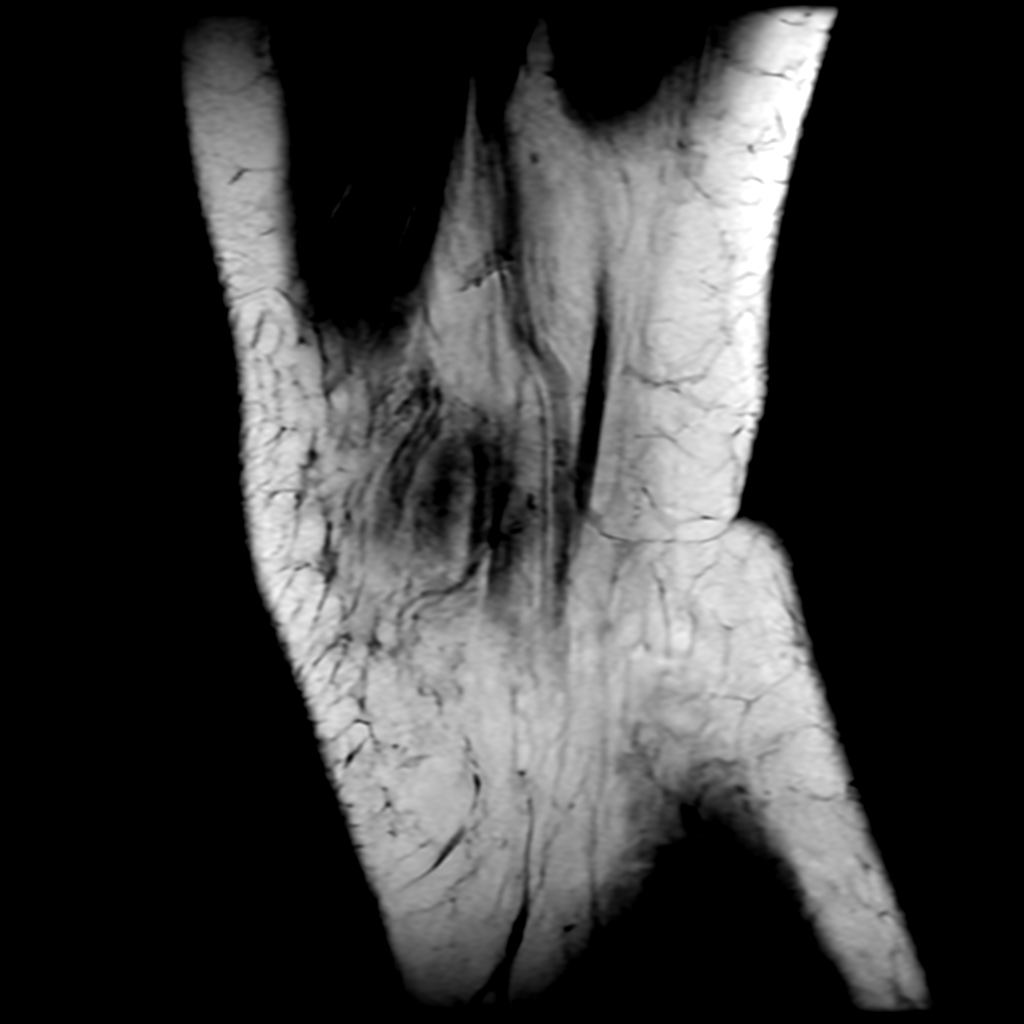

[Series 4: sag dp fs · sagittal · 4.0mm · 0.33mm/px · 3 of 20 slices shown]
[im 4/20]
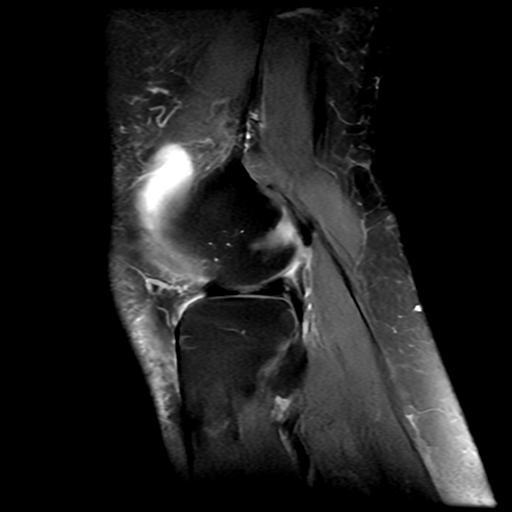
[im 12/20]
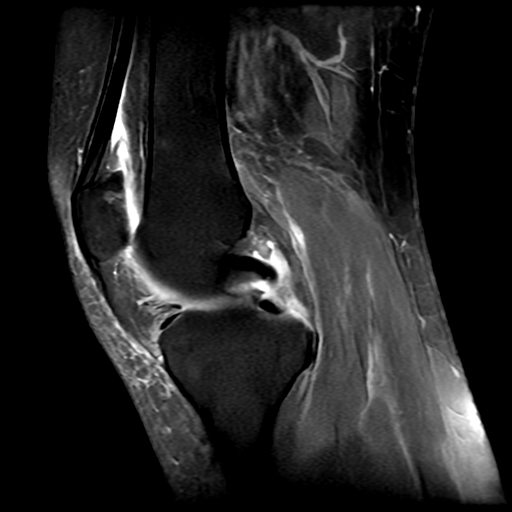
[im 20/20]
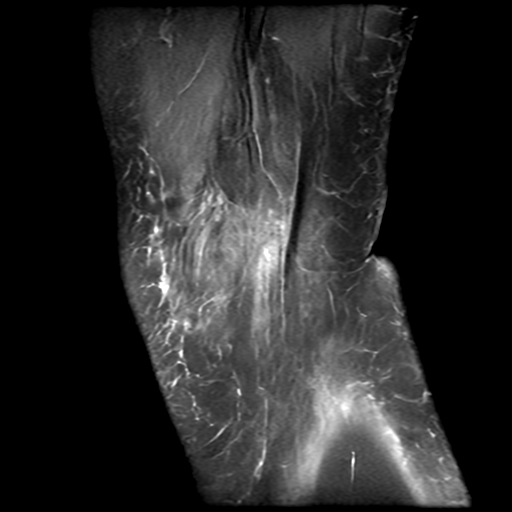

[11 of 40 positions shown; findings below may reference images not displayed]

Técnica:
Exame realizado com sequências ponderadas em T1, T2 e T2 com supressão de gordura.
Relatório:
Pequeno derrame articular.
Rotura longitudinal oblíqua no corno posterior do menisco medial com extensão para superfície articular
RESSONÂNCIA MAGNÉTICA DO JOELHO ESQUERDO
inferior.
Rotura longitudinal oblíqua no corpo e corno posterior do menisco lateral com extensão para superfície
articular superior.
Condropatia patelar avançada com fissuras e erosões condrais profundas no vértice e faceta medial da patela,
associadas a edema subcondral.
Condropatia moderada no côndilo femoral medial com extensas áreas de afilamento condral.
Bursite da pata de ganso e do ligamento colateral medial.
Cisto de Baker parcialmente roto.
Ligamentos cruzados íntegros.
Ligamentos colateral medial e colateral lateral sem alterações.
Tendões quadríceps e patelar sem alterações.
Demais estruturas musculares e tendíneas periarticulares sem anormalidades.
Fossa poplítea sem anormalidades.
Impressão:
Pequeno derrame articular.
Rotura longitudinal oblíqua no corno posterior do menisco medial com extensão para superfície articular
inferior.
Rotura longitudinal oblíqua no corpo e corno posterior do menisco lateral com extensão para superfície
articular superior.
Condropatia patelar avançada com fissuras e erosões condrais profundas no vértice e faceta medial da patela,
associadas a edema subcondral.
Condropatia moderada no côndilo femoral medial com extensas áreas de afilamento condral.
Bursite da pata de ganso e do ligamento colateral medial.
Cisto de Baker parcialmente roto.

## 2023-10-13 IMAGING — MR LOMBAR
4 of 5 series · 19 of 48 positions shown · non-contrast
Comparison: none

[Series 2: T2 · sagittal · 4.0mm · 0.55mm/px · 7 of 12 slices shown (1 of 2)]
[im 1/12]
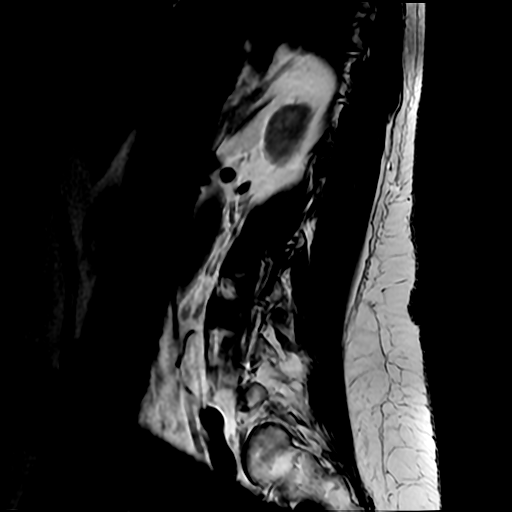
[im 2/12]
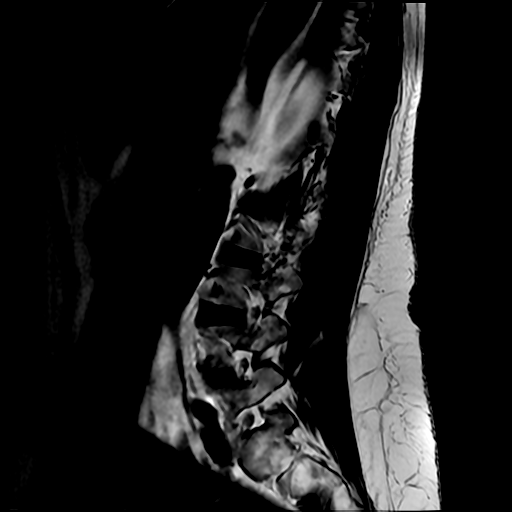
[im 4/12]
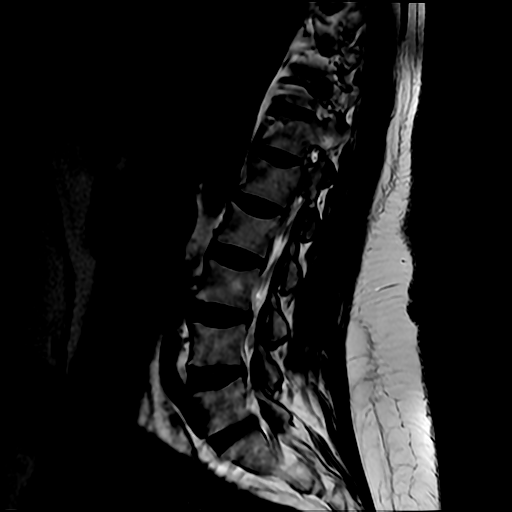
[im 6/12]
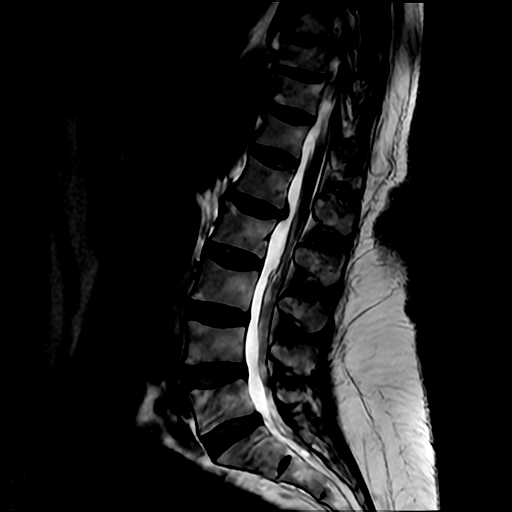
[im 8/12]
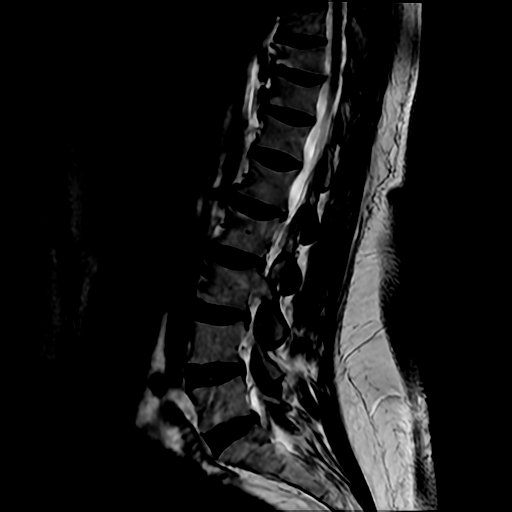
[im 10/12]
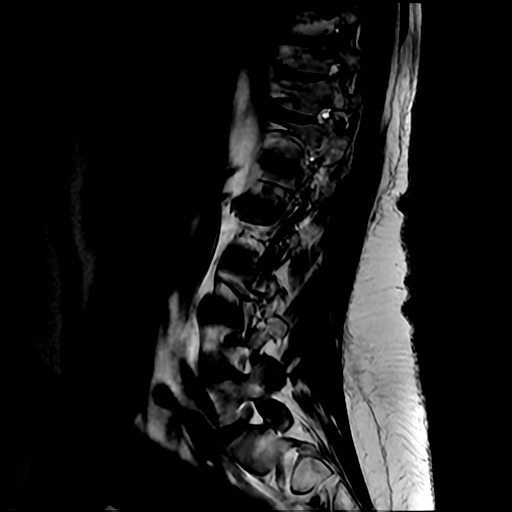
[im 12/12]
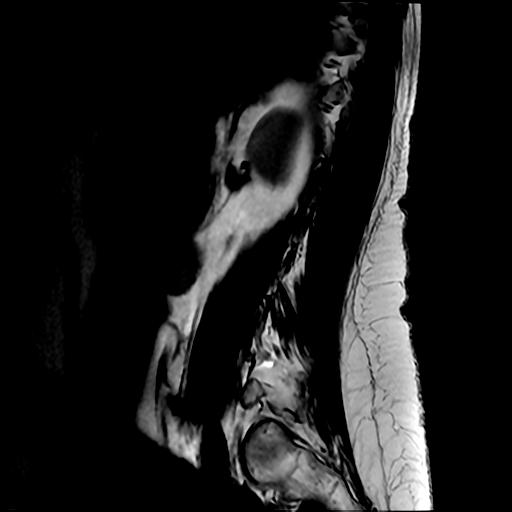

[Series 3: T1 · sagittal · 4.0mm · 0.55mm/px · 3 of 12 slices shown]
[im 2/12]
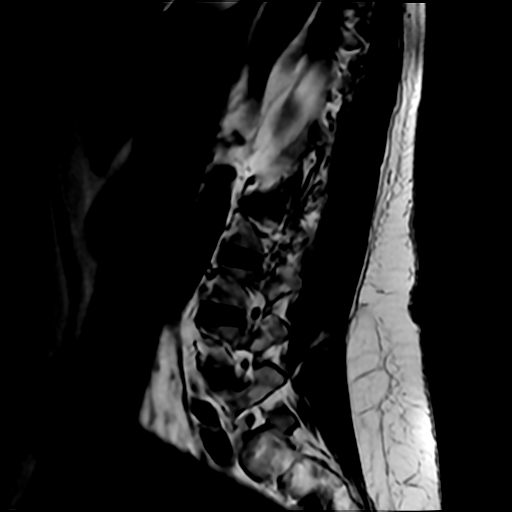
[im 6/12]
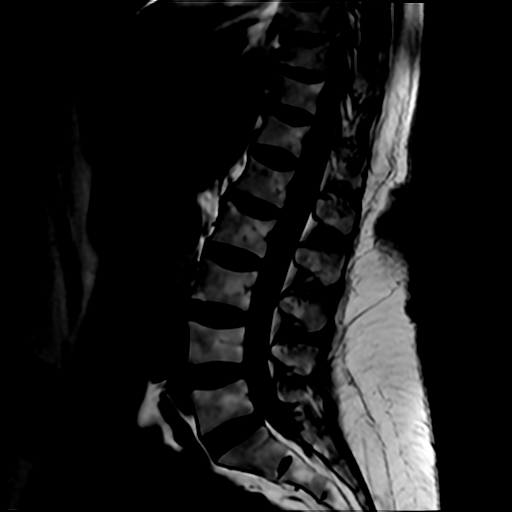
[im 10/12]
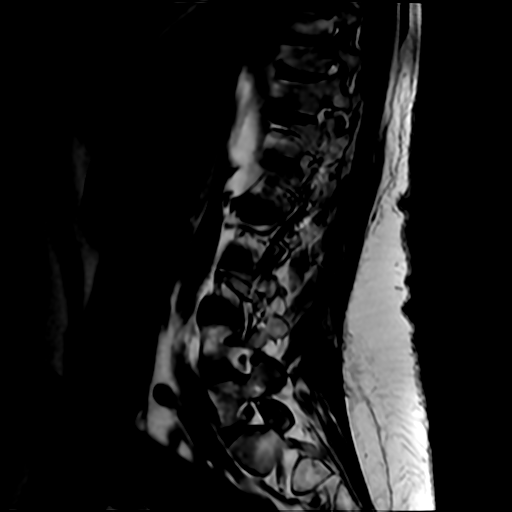

[Series 4: STIR · sagittal · 4.0mm · 0.55mm/px · 3 of 12 slices shown]
[im 2/12]
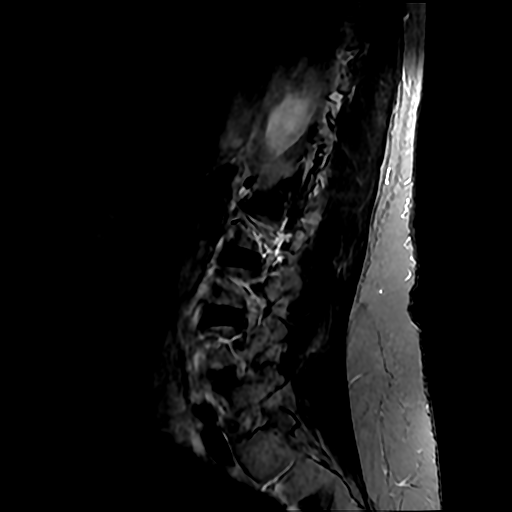
[im 6/12]
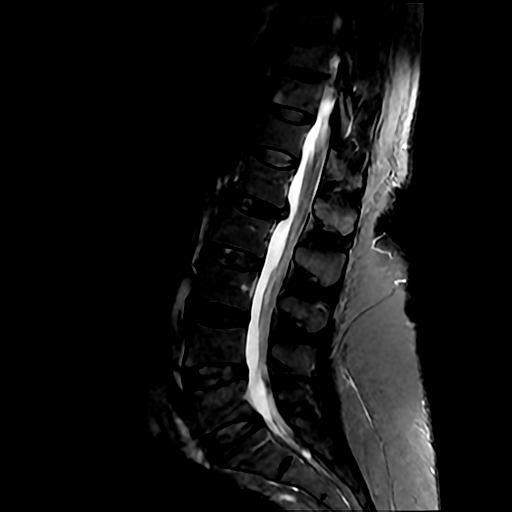
[im 10/12]
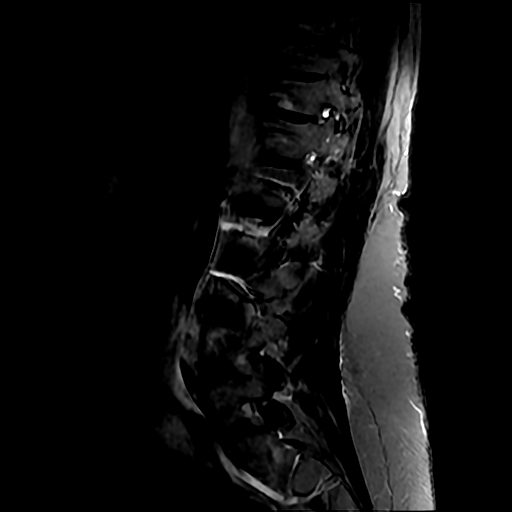

[Series 5: T2 · axial · 4.0mm · 0.35mm/px · z∈[-36,+122]mm · 6 of 30 slices shown (2 of 2)]
[im 2/30]
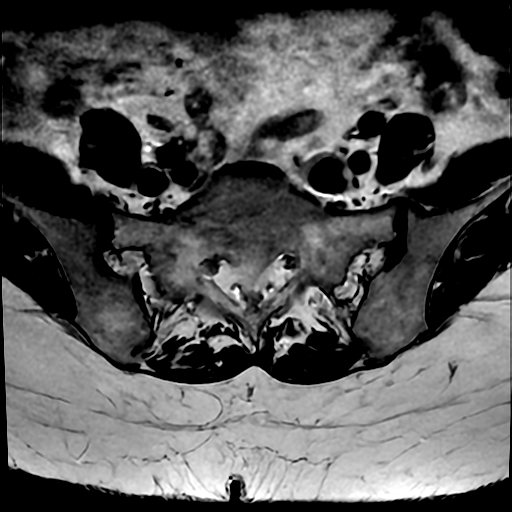
[im 4/30]
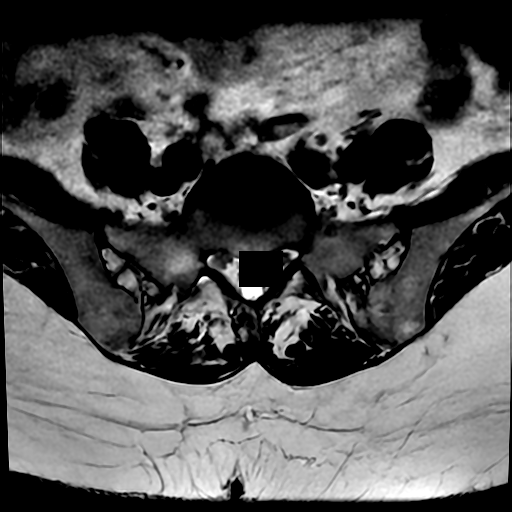
[im 6/30]
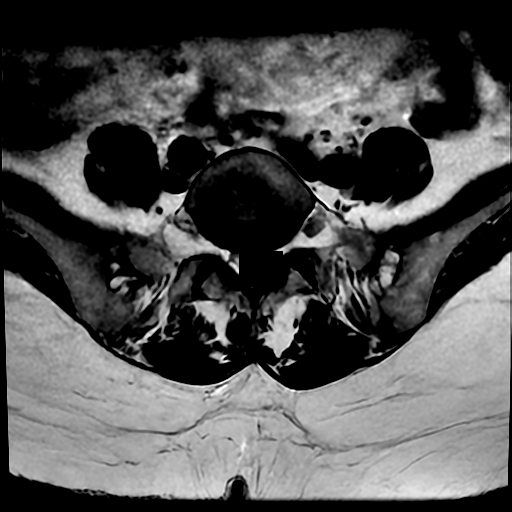
[im 10/30]
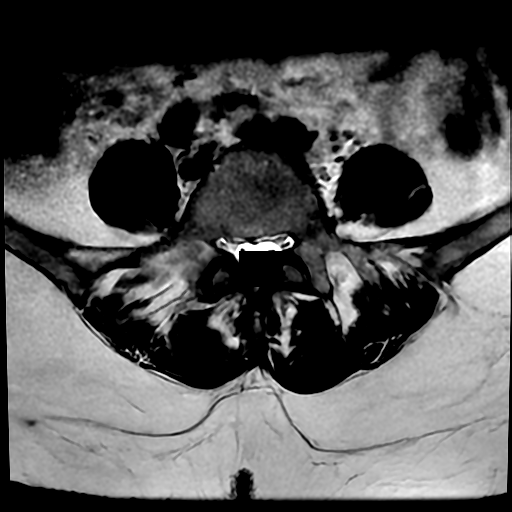
[im 15/30]
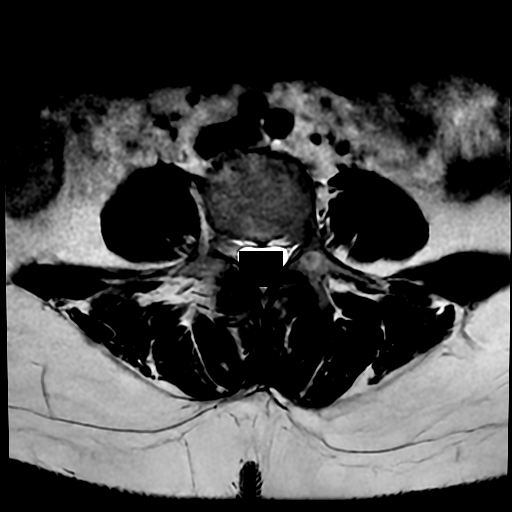
[im 26/30]
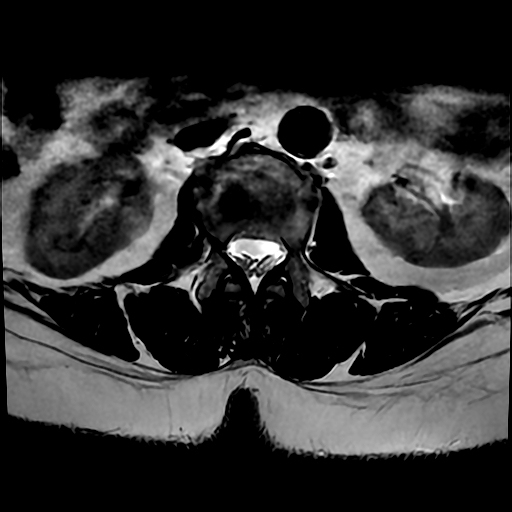

[19 of 48 positions shown; findings below may reference images not displayed]

Técnica:
Exame realizado com sequências ponderadas em T1 e T2, sem a administração intravenosa do agente de
contraste paramagnético.
Relatório:
Acentuação da lordose lombar.
RESSONÂNCIA MAGNÉTICA DA COLUNA LOMBOSSACRA
Corpos vertebrais de altura preservada e alinhamento posterior mantido.
Discreto rebaixamento do platô vertebral superior de L1, L2 e L3.
Redução em altura de corpos vertebrais dorsais inferiores.
Alterações degenerativas interfacetárias difusas.
Perda difusa do hipersinal habitual em T2 dos discos intervertebrais por desidratação.
Em L1-L2 há protrusão discal paramediana esquerda que imprime a coluna liquórica anterior.
Em L3-L4 e L4-L5 há mínimos abaulamentos discais difusos que imprimem a face ventral do saco tecal e
reduzem discretamente a amplitude dos neuroforames.
Em L5-S1 há mínima protrusão discal posterior que imprime o plano adiposo epidural anterior.
O cone medular é tópico, sendo de calibre e intensidade de sinal normais.
Raízes nervosas da cauda eqüina de morfologia e distribuição anatômica.
Impressão:
Alterações degenerativas difusas da coluna lombossacra.

## 2023-10-31 ENCOUNTER — Ambulatory Visit
Admission: RE | Admit: 2023-10-31 | Discharge: 2023-10-31 | Disposition: A | Discharge status: Home / Self Care | Payer: Exclusive Provider Organization

## 2023-10-31 ENCOUNTER — Other Ambulatory Visit: Payer: Self-pay

## 2023-10-31 DIAGNOSIS — D500 Iron deficiency anemia secondary to blood loss (chronic): Secondary | ICD-10-CM | POA: Insufficient documentation

## 2023-10-31 DIAGNOSIS — Z0000 Encounter for general adult medical examination without abnormal findings: Secondary | ICD-10-CM | POA: Insufficient documentation

## 2023-10-31 LAB — BASIC METABOLIC PANEL
ANION GAP: 11 mmol/L (ref 10–22)
BUN (UREA NITROGEN): 13 mg/dL (ref 7–18)
CALCIUM: 9.4 mg/dL (ref 8.5–10.5)
CARBON DIOXIDE: 26 mmol/L (ref 21–32)
CHLORIDE: 104 mmol/L (ref 98–107)
CREATININE: 0.8 mg/dL (ref 0.4–1.2)
ESTIMATED GLOMERULAR FILT RATE: >60 mL/min (ref >60)
Glucose Random: 96 mg/dL (ref 74–160)
POTASSIUM: 4.6 mmol/L (ref 3.5–5.1)
SODIUM: 141 mmol/L (ref 136–145)

## 2023-10-31 LAB — CBC, PLATELET & DIFFERENTIAL
ABSOLUTE BASO COUNT: 0 10*3/uL (ref 0.0–0.1)
ABSOLUTE EOSINOPHIL COUNT: 0 10*3/uL (ref 0.0–0.8)
ABSOLUTE IMM GRAN COUNT: 0.02 10*3/uL (ref 0.00–0.10)
ABSOLUTE LYMPH COUNT: 2.4 10*3/uL (ref 0.6–5.9)
ABSOLUTE MONO COUNT: 0.4 10*3/uL (ref 0.2–1.4)
ABSOLUTE NEUTROPHIL COUNT: 3.6 10*3/uL (ref 1.6–8.3)
ABSOLUTE NRBC COUNT: 0 10*3/uL (ref 0.0–0.0)
BASOPHIL %: 0.3 % (ref 0.0–1.2)
EOSINOPHIL %: 0.5 % (ref 0.0–7.0)
HEMATOCRIT: 39.8 % (ref 34.1–44.9)
HEMOGLOBIN: 12.8 g/dL (ref 11.2–15.7)
IMMATURE GRANULOCYTE %: 0.3 % (ref 0.0–1.0)
LYMPHOCYTE %: 37.2 % (ref 15.0–54.0)
MEAN CORP HGB CONC: 32.2 g/dL (ref 31.0–37.0)
MEAN CORPUSCULAR HGB: 27.6 pg (ref 26.0–34.0)
MEAN CORPUSCULAR VOL: 86 fl (ref 80.0–100.0)
MEAN PLATELET VOLUME: 10 fL (ref 8.7–12.5)
MONOCYTE %: 5.9 % (ref 4.0–13.0)
NEUTROPHIL %: 55.8 % (ref 40.0–75.0)
NRBC %: 0 % (ref 0.0–0.0)
PLATELET COUNT: 317 10*3/uL (ref 150–400)
RBC DISTRIBUTION WIDTH STD DEV: 46.5 fL — ABNORMAL HIGH (ref 35.1–46.3)
RED BLOOD CELL COUNT: 4.63 M/uL (ref 3.90–5.20)
WHITE BLOOD CELL COUNT: 6.5 10*3/uL (ref 4.0–11.0)

## 2023-10-31 LAB — FERRITIN: FERRITIN: 27 ng/mL (ref 13–150)

## 2023-10-31 LAB — TOTAL IRON BINDING CAPACITY: TOTAL IRON BIND CAPACITY CALC: 393 ug/dL (ref 280–504)

## 2023-10-31 LAB — IRON: IRON: 60 ug/dL (ref 50–170)

## 2023-11-04 ENCOUNTER — Other Ambulatory Visit: Payer: Self-pay

## 2023-11-04 ENCOUNTER — Ambulatory Visit: Attending: Internal Medicine

## 2023-11-04 VITALS — BP 130/78 | HR 79 | Temp 97.7°F | Wt 136.0 lb

## 2023-11-04 DIAGNOSIS — I10 Essential (primary) hypertension: Secondary | ICD-10-CM | POA: Insufficient documentation

## 2023-11-04 DIAGNOSIS — Z9289 Personal history of other medical treatment: Secondary | ICD-10-CM | POA: Insufficient documentation

## 2023-11-04 MED ORDER — LISINOPRIL 5 MG PO TABS
5.0000 mg | ORAL_TABLET | Freq: Every day | ORAL | 1 refills | Status: DC
Start: 2023-11-04 — End: 2024-04-25

## 2023-11-04 NOTE — Progress Notes (Signed)
 Hypertension Management Clinic Visit  Condition(s) addressed at this pharmacotherapy visit: Bill By Complexity/Medical Decision Making (MDM) and Hypertension/Elevated BP (MDM)     Translator used for this visit: Yes, [place]. [id number]    SUBJECTIVE   Kayla Garza is a 68 year old female with uncontrolled hypertension who presents to clinic forfollow-up HTN management.     Last pharmacotherapy visit was on 08/25/23  Changes made during visit included:   none    Medications Management:  Reconciled with pt verbally  Manages medication themselves.    HTN Regimen:  lisinopril  5mg  daily    Took medications today: Yes. 5:30AM this morning  Adherence: Patient reports taking medication every day    Self-Monitoring of Blood Pressure (SMBP):   Last validated in clinic - Due today. Validation completed below.   Mirco brand    Signs/symptoms of hypotension: None reported  Signs/symptoms of hypertension: None reported    Lifestyle:  Diet:   - Caffeine: coffee, today6AM   - Sodium: home cooked meals - no, eat at work    Physical activity:  home elliptical machine 3-4 times a week for 1 hour    OBJECTIVE     Blood pressure cuff validation per AMA 2020  STEP 1: Complete the table below.  5 blood pressure readings taken using a combination of the patient's SMBP device and the office's method of blood pressure measurement.     MEASUREMENT DEVICE SYSTOLIC BLOOD PRESSURE (SBP)   A PATIENT'S 143/91 HR 82   B PATIENT'S 147/88 HR 89   C OFFICE'S 148/76 HR 84   D PATIENT'S 140/88 HR 86   E OFFICE'S      STEP 2:   Average measurements B and D ((B+D)/2): 143.5  The difference of average of B and D (above) and measurement C (Above - C): 4.5  The difference is: <5 mmHg: This device CAN BE USED for SMBP  *If the difference is <29mmHg: SKIP step 3    130/78 HR 79    Electrolytes  Lab Results   Component Value Date    NA 141 10/31/2023    K 4.6 10/31/2023    CL 104 10/31/2023    CA 9.4 10/31/2023     Kidney Function  Lab Results   Component  Value Date    BUN 13 10/31/2023    CREAT 0.8 10/31/2023    GFR > 60 10/31/2023           Blood Pressure    Most Recent BP Reading(s)  08/25/23 : 131/84  08/19/23 : 144/85  08/18/23 : (!) 161/93      Heart Rate  Most Recent Pulse Reading(s)  08/25/23 : 82  08/19/23 : 66  08/18/23 : 74             ASSESSMENT/RECOMMENDATIONS   BLOOD PRESSURE  Education:   Recommended to not consume more than 2400mg  of sodium/day and to try to reduce sodium intake to 1500mg /day and Recommended to engage in regular aerobic physical activity such as brisk walking for at least 30 min/day most days of the week  Reviewed blood pressure goals, benefits of optimal control, symptoms of hypertension and hypotension, side effects of drug treatment, drug mechanisms of action, duration of treatment, risk of complications, pathophysiology, complementary lifestyle modifications, and prospect of self measurement of blood pressure.    Today's blood pressure was above goal  (130/80 (per ACC/AHA guidelines). Next due: Next visit   SMBP machine:  next validation due 10/2024  Advised to check and record BP readings 3 times a week    Assessment/Intervention: discussed lifestyle modifications, repeated BP in clinic at goal. Will review home BP readings before adjusting  CONTINUE lisinopril  5mg  daily    RENAL  Last BMP WNL, Scr 0.8 eGFR >60 mL/min on 10/31/23    MEDICATION RECONCILIATION  Medication reconciliation was completed today verbally  Refills submitted: none    Will discuss any lab results at next visit or sooner if clinically indicated.    Planned return date:  Future Appointments   Date Time Provider Department Center   11/24/2023  3:20 PM Earnestine Goes [facility] AMB   12/28/2023  3:40 PM Anne-Marie, Genson, APRN [facility] CARE [facility]   02/16/2024  3:30 PM Brand Cai., MD [facility]   05/25/2024  4:00 PM Elyn Han, MD [facility]       Encouraged patient to keep scheduled pharmacotherapy visits and to call site pharmacist with questions.  Provided verbal and written dosing instructions. Pt verbalized understanding of everything discussed and agrees with plan. Pt was given the opportunity to ask questions/voice concerns.

## 2023-11-10 ENCOUNTER — Ambulatory Visit (HOSPITAL_BASED_OUTPATIENT_CLINIC_OR_DEPARTMENT_OTHER)

## 2023-11-24 ENCOUNTER — Ambulatory Visit: Attending: Internal Medicine

## 2023-11-24 ENCOUNTER — Encounter (HOSPITAL_BASED_OUTPATIENT_CLINIC_OR_DEPARTMENT_OTHER): Payer: Self-pay

## 2023-11-24 ENCOUNTER — Other Ambulatory Visit: Payer: Self-pay

## 2023-11-24 VITALS — BP 149/87 | HR 99 | Temp 98.1°F | Wt 155.3 lb

## 2023-11-24 DIAGNOSIS — E611 Iron deficiency: Secondary | ICD-10-CM | POA: Diagnosis not present

## 2023-11-24 DIAGNOSIS — D500 Iron deficiency anemia secondary to blood loss (chronic): Secondary | ICD-10-CM | POA: Insufficient documentation

## 2023-11-24 MED ORDER — FERROUS SULFATE 325 (65 FE) MG PO TBEC
325.0000 mg | DELAYED_RELEASE_TABLET | Freq: Every day | ORAL | 3 refills | Status: DC
Start: 2023-11-24 — End: 2024-03-01

## 2023-11-28 NOTE — Progress Notes (Signed)
 HEMATOLOGY/ONCOLOGY   PHYSICIAN ASSISTANT NOTE     DATE OF SERVICE:  November 24, 2023   REASON FOR VISIT: Iron deficiency anemia     HISTORY: 68 year old postmenopausal woman who presents for follow up regarding iron deficiency.    Initial consult January 2025 with Dr. Forbes Ida. Please refer to his notes for full context. She was started on po iron supplements. She has undergone previous GI work up. Possibly low iron 2/2 chronic blood loss from diverticulosis. No history of gastric bypass surgery. No dietary restrictions.     Colonoscopy July 15, 2023. Diverticulosis in sigmoid colon. 5mm polyp removed from transverse colon. Repeat colo 7-10 years.     EGD August 19, 2023. Gastritis, biopsied. Multiple gastric polyps.  Small hiatal hernia. 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 No dysplasia or malignancy. H plyori negative.     Kayla Garza arrives today in person and was interviewed with [name]. Reports feeling well with no new complaints. She did run out of the ferrous sulfate  last week but otherwise endorses compliance. No GI intolerance. She denies pica, skin/hair/nail changes, weight loss, early satiety, bloating, hematochezia, melena, abdominal pain.    PHYSICAL EXAM:   Well appearing middle aged woman who arrives alone. Weight 155 lb. BMI 26.6. No adenopathy of neck or axilla. No evidence of pallor. Pulse regular, 99. Abdomen soft, nondistended.     DATA:   Latest Reference Range & Units 06/30/23 17:21 07/20/23 16:41 10/31/23 15:36   WHITE BLOOD CELL COUNT 4.0 - 11.0 TH/uL 7.2 5.5 6.5   RED BLOOD CELL COUNT 3.90 - 5.20 M/uL 4.55 4.45 4.63   HEMOGLOBIN 11.2 - 15.7 g/dL 15.1 76.1 60.7   HEMATOCRIT 34.1 - 44.9 % 38.9 37.6 39.8   MEAN CORPUSCULAR VOL 80.0 - 100.0 fl 85.5 84.5 86.0   MEAN CORPUSCULAR HGB 26.0 - 34.0 pg 26.4 27.0 27.6   MEAN CORP HGB CONC 31.0 - 37.0 g/dL 37.1 (L) 06.2 69.4   RBC DISTRIBUTION WIDTH STD DEV 35.1 - 46.3 fL 48.7 (H)  46.4 (H) 46.5 (H)   PLATELET COUNT 150 - 400 TH/uL 385 300 317      Latest Reference Range & Units 07/20/23 16:41 10/31/23 15:36   IRON 50 - 170 ug/dL 45 (L) 60   TOTAL IRON BIND CAPACITY CALC 280 - 504 ug/dL 854 627   FERRITIN 13 - 150 ng/mL 14 27        ASSESSMENT AND PLAN:v Kayla Garza is a 68 year old postmenopausal woman referred for further evaluation of low iron likely due to chronic blood loss from diverticulosis.     She is up to date with GI screening, no source identified. She is not anemic.    Iron stores have increased on po ferrous sulfate . Refill sent. Recommend that she continue this. Stool softeners PRN if constipation while on oral iron. This has not been an issue for her. Ongoing follow up with PCP. We are happy to see her back if requested.     Kayla Corn PA-C

## 2023-12-05 ENCOUNTER — Ambulatory Visit

## 2023-12-05 DIAGNOSIS — Z9289 Personal history of other medical treatment: Secondary | ICD-10-CM

## 2023-12-05 DIAGNOSIS — I10 Essential (primary) hypertension: Secondary | ICD-10-CM

## 2023-12-05 NOTE — Progress Notes (Signed)
 Hypertension Management Clinic Visit  Condition(s) addressed at this pharmacotherapy visit: Bill By Complexity/Medical Decision Making (MDM) and Hypertension/Elevated BP (MDM)     Translator used for this visit: Yes, [place]. [id number]    SUBJECTIVE   Kayla Garza is a 68 year old female with uncontrolled hypertension who presents to clinic forfollow-up HTN management.     Last pharmacotherapy visit was on  11/04/23  Changes made during visit included:   none    Medications Management:  Reconciled with pt verbally  Manages medication themselves.    HTN Regimen:  lisinopril  5mg  daily    Adherence: Patient reports taking medication every day    Self-Monitoring of Blood Pressure (SMBP):   Last validated in clinic - In past year, date: 11/04/23  Mirco brand      SBP DBP  11/21/23  134 75  11/22/23  127 75  12/01/23  115 66    Signs/symptoms of hypotension: None reported  Signs/symptoms of hypertension: None reported    Lifestyle:  Diet:   - Caffeine: coffee   - Sodium: home cooked meals - no, eat at work    Physical activity:  home elliptical machine 3-4 times a week for 1 hour    OBJECTIVE       Electrolytes  Lab Results   Component Value Date    NA 141 10/31/2023    K 4.6 10/31/2023    CL 104 10/31/2023    CA 9.4 10/31/2023     Kidney Function  Lab Results   Component Value Date    BUN 13 10/31/2023    CREAT 0.8 10/31/2023    GFR > 60 10/31/2023           Blood Pressure    Most Recent BP Reading(s)  11/24/23 : 149/87  11/04/23 : 130/78  08/25/23 : 131/84      Heart Rate  Most Recent Pulse Reading(s)  11/24/23 : 99  11/04/23 : 79  08/25/23 : 82             ASSESSMENT/RECOMMENDATIONS   BLOOD PRESSURE  Education:   Recommended to not consume more than 2400mg  of sodium/day and to try to reduce sodium intake to 1500mg /day and Recommended to engage in regular aerobic physical activity such as brisk walking for at least 30 min/day most days of the week  Reviewed blood pressure goals, benefits of optimal control, symptoms  of hypertension and hypotension, side effects of drug treatment, drug mechanisms of action, duration of treatment, risk of complications, pathophysiology, complementary lifestyle modifications, and prospect of self measurement of blood pressure.    Today's self reported limited blood pressure was at goal  (130/80 (per ACC/AHA guidelines). Next due: Next visit   SMBP machine:  next validation due 10/2024  Advised to check and record BP readings  once a week due to patient's schedule    Assessment/Intervention: discussed lifestyle modifications, repeated BP in clinic at goal. Limited home BP readings at goal, will follow in 3 months.  CONTINUE lisinopril  5mg  daily    RENAL  Last BMP WNL, Scr 0.8 eGFR >60 mL/min on 10/31/23    MEDICATION RECONCILIATION  Medication reconciliation was completed today verbally  Refills submitted: none    Will discuss any lab results at next visit or sooner if clinically indicated.    Planned return date:  Future Appointments   Date Time Provider Department Center   12/28/2023  3:40 PM Kyleen, Villatoro, APRN [facility]   02/16/2024  3:30 PM Brand Cai., MD [facility]   05/25/2024  4:00 PM Elyn Han, MD EMS [facility]       Encouraged patient to keep scheduled pharmacotherapy visits and to call site pharmacist with questions. Provided verbal and written dosing instructions. Pt verbalized understanding of everything discussed and agrees with plan. Pt was given the opportunity to ask questions/voice concerns.

## 2023-12-12 ENCOUNTER — Ambulatory Visit (HOSPITAL_BASED_OUTPATIENT_CLINIC_OR_DEPARTMENT_OTHER)

## 2023-12-28 ENCOUNTER — Ambulatory Visit: Attending: Internal Medicine | Admitting: Internal Medicine

## 2023-12-28 VITALS — BP 148/82 | HR 64 | Temp 97.3°F | Ht 60.39 in | Wt 156.4 lb

## 2023-12-28 DIAGNOSIS — F4320 Adjustment disorder, unspecified: Secondary | ICD-10-CM | POA: Insufficient documentation

## 2023-12-28 DIAGNOSIS — E611 Iron deficiency: Secondary | ICD-10-CM | POA: Insufficient documentation

## 2023-12-28 DIAGNOSIS — I10 Essential (primary) hypertension: Secondary | ICD-10-CM | POA: Diagnosis not present

## 2023-12-28 NOTE — Progress Notes (Signed)
 Kayla Garza is a 68 year old female, [place], I last saw her in Feb/25  Last visit with PharmD for HTN mgmt on 6/16, on lisinopril  5mg     Today for routine FU with PCP    #) HTN  Pt reports taking medications daily. Denies chest pain, SOB, DOE, palpitations, dizziness, head aches, leg edema.   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 Recently, she was  notified that her Juliene hired a company to do her job without directly notifying her, or discussing with her. She feels very bad about this.  She's been upset about it, not sleeping well, ruminating thoughts    OBJECTIVE:  BP 148/82 (Site: RA, Position: Sitting, Cuff Size: Reg)   Pulse 64   Temp 97.3 F (36.3 C) (Temporal)   Ht 5' 0.39 (1.534 m)   Wt 70.9 kg (156 lb 6.4 oz)   SpO2 100%   BMI 30.15 kg/m   Pleasant, in NAD  Heart: S1 and S2 normal, no murmurs, clicks, gallops or rubs. Regular rate and rhythm.   Lungs:  clear; no wheezes, rhonchi or rales.    ASSESSMENT & PLAN:  (I10) Primary hypertension  (primary encounter diagnosis)  Comment: Mildly elevated today, but had normal readings on occasional home readings  Plan: Continue current regimen  Low sodium diet  Regular physical activity  Continue home monitoring, aim for 2-3x week, keep log  FU with clinical Pharm in Sept as scheduled    (E61.1) Low iron  Comment: Takes ferrous sulfate  once a day, no constipation  Plan: Recheck in August, future labs    (F43.20) Adjustment disorder, unspecified type  Comment: Extensive supportive listening  Plan: Pt was thankful for the support. Self care encouraged    The patient was ready to learn and no apparent learning or adherence barriers were identified. I  explained the diagnosis and treatment plan, and the patient expressed understanding of the content. I attempted to answer any questions regarding the diagnosis and the proposed treatment.     We discussed the patient's current medications.  We discussed the importance of medication compliance. The patient expressed understanding and no barriers to adherence were identified.    follow-up 6 ms or prn  she has been advised to call or return with any worsening or new problems

## 2024-02-16 ENCOUNTER — Encounter (HOSPITAL_BASED_OUTPATIENT_CLINIC_OR_DEPARTMENT_OTHER): Payer: Self-pay | Admitting: Physician Assistant

## 2024-02-16 ENCOUNTER — Other Ambulatory Visit: Payer: Self-pay

## 2024-02-16 ENCOUNTER — Ambulatory Visit (HOSPITAL_BASED_OUTPATIENT_CLINIC_OR_DEPARTMENT_OTHER): Admitting: Hand Surgery

## 2024-02-16 ENCOUNTER — Ambulatory Visit
Admission: RE | Admit: 2024-02-16 | Discharge: 2024-02-16 | Disposition: A | Discharge status: Home / Self Care | Source: Ambulatory Visit | Attending: Internal Medicine

## 2024-02-16 VITALS — BP 131/82 | HR 71 | Temp 97.7°F | Resp 18 | Ht 60.43 in | Wt 157.8 lb

## 2024-02-16 DIAGNOSIS — R200 Anesthesia of skin: Secondary | ICD-10-CM | POA: Insufficient documentation

## 2024-02-16 DIAGNOSIS — D649 Anemia, unspecified: Secondary | ICD-10-CM | POA: Diagnosis present

## 2024-02-16 DIAGNOSIS — M79602 Pain in left arm: Secondary | ICD-10-CM | POA: Insufficient documentation

## 2024-02-16 DIAGNOSIS — M67432 Ganglion, left wrist: Secondary | ICD-10-CM | POA: Diagnosis present

## 2024-02-16 LAB — IRON: IRON: 56 ug/dL (ref 50–170)

## 2024-02-16 LAB — FERRITIN: FERRITIN: 57 ng/mL (ref 13–150)

## 2024-02-16 LAB — TOTAL IRON BINDING CAPACITY: TOTAL IRON BIND CAPACITY CALC: 374 ug/dL (ref 280–504)

## 2024-02-16 NOTE — Addendum Note (Signed)
 Addended by: BELMA LEW A. on: 02/16/2024 04:22 PM     Modules accepted: Orders

## 2024-02-16 NOTE — Progress Notes (Signed)
 Left dorsal wrist ganglion which is painful. Plan is excision. Surgery was explained and consent obtained.  Please see the full note of Duwaine Chiles, PA-C.    MD ATTESTATION  I, Dr. Adrien Mae, have seen and examined this patient on the date of service, confirmed key exam findings, reviewed any pertinent studies and images, and formulated the plan of care. I agree with the note as outlined above. I spent a substantive portion of time with the patient.    Adrien DELENA Mae, MD, 02/16/2024

## 2024-02-16 NOTE — Progress Notes (Signed)
 HAND SURGERY PREOPERATIVE H&P    Kayla Hadassah SQUIBB, APRN      CC: Request to book surgery  Diagnosis left wrist dorsal ganglion    HPI- This is a 68 year old year old female who presents today for evaluation of her left wrist dorsal ganglion.  She has been followed by us  since January 2025.  She would now like to have this surgically excised because it continues to bother her and is painful.  She is right-handed  Denies numbness in the left upper extremity    Past Medical History:  05/25/2012: Allergic conjunctivitis  No date: Breast lump      Comment:  1/00 Bx > fibroadenoma  2000: Helicobacter pylori (H. pylori)      Comment:  s/p treatment for 2 months  05/25/2012: Hyperopia with astigmatism and presbyopia  1999: Other and unspecified ovarian cyst  05/25/2012: Pinguecula of both eyes  Past Surgical History:  No date: BREAST BIOPSY      Comment:  negative right  needle Bx  1990: BRST RCNSTJ IMMT/DLYD W/TISS EXPANDER SBSQ XPNSJ      Comment:  s/p removal of excess skin  1980: CRANIEC TREPHINE BONE FLP BRAIN TUMOR SUPRTENTOR      Comment:  craniotomy age 38, tumor pituitary  1997: HYSTERECTOMY      Comment:  surgery for uterine fibroids. 7/01 Pap of  vag cuff                Negative; ovaries remain  No date: REDUCTION MAMMAPLASTY  1994: ROOT CANAL  Review of Patient's Allergies indicates:  No Known Allergies      Social History-Works in housekeeping at [employer]  Social History    Tobacco Use      Smoking status: Never      Smokeless tobacco: Never    Alcohol use: No    Review of patient's family history indicates:  Problem: Osteoporosis      Relation: Mother          Age of Onset: (Not Specified)  Problem: Pulmonary      Relation: Mother          Age of Onset: (Not Specified)          Comment: COPD? smoker  Problem: VASCULAR      Relation: Father          Age of Onset: (Not Specified)          Comment: ? DVT  Problem: Psychiatric Illness      Relation: Brother          Age of Onset: (Not Specified)          Comment:  depression  Problem: Cancer - Breast      Relation: FamHxNeg          Age of Onset: (Not Specified)  Problem: Cancer - Colon      Relation: FamHxNeg          Age of Onset: (Not Specified)  Problem: Diabetes      Relation: FamHxNeg          Age of Onset: (Not Specified)  Problem: Heart      Relation: FamHxNeg          Age of Onset: (Not Specified)    (Not in a hospital admission)      Review of Systems:  Gen- Negative for fevers, chills, recent illnesses.  Patient Active Problem List:     Overweight (BMI 25.0-29.9)     Vitamin  D deficiency     Ovarian cyst     Allergic rhinitis     Renal anomaly     Pinguecula of both eyes     Allergic conjunctivitis     Hyperopia with astigmatism and presbyopia     History of Cushing disease     Osteoarthritis of both knees     Coccyx pain     Laryngopharyngeal reflux (LPR)     Palpitations     Goiter     S/P hysterectomy     History of hyperthyroidism     Epidermal cyst     Osteopenia of multiple sites     Neck pain, acute     Pain and numbness of left upper extremity     Cervical stenosis of spinal canal     Essential hypertension     Home BP machine (validated 11/04/23)     Ganglion cyst of dorsum of left wrist        Physical Exam:   Interpreter-Portuguese phone ID [phone]  BP 131/82 (Site: LA, Position: Sitting, Cuff Size: Reg)   Pulse 67   Temp 97.7 F (36.5 C) (Temporal)   Resp 18   Ht 5' 0.43 (1.535 m)   Wt 71.6 kg (157 lb 12.8 oz)   SpO2 97%   BMI 30.38 kg/m   Pain Score: Data Unavailable          Gen-no apparent distress, alert and x 3  HEENT- NCAT, oropharynx moist, neck supple, sclerae anicteric bilat     Lungs-breathing comfortably on room air  Ext- No rashes or lesions.  At the left wrist she has full range of motion.  There is a 1.5 cm dorsal mass at the radiocarpal joint consistent with a ganglion.  It is tender to palpation and most visible with the wrist in flexion  2+ radial pulse left upper extremity  Sensation intact in median, radial and ulnar nerve  distributions in the left upper extremity  ROM-full in the left hand and wrist    Imaging-x-rays dated 07/21/2023 of the left wrist show no soft tissue or bony abnormality  All pertinent imaging was also reviewed by Dr. Belma today.    A/P- This is a 68 year old year old female with a painful chronic left wrist dorsal ganglion.  The diagnosis is reviewed.  She would like to have this excised.  She will be scheduled for date convenient for her and anticipates taking 6 weeks off from work postoperatively which is reasonable.  Letter is given today.  We started to complete her out of work paperwork and once her surgical date is determined we will complete the paperwork and she can pick it up.    Indications for surgery, alternative methods of treatment, including no treatment, risks involved and the possibility of complications including infection, nerve/ tendon injury, stiffness, recurrence, need for further surgery, failure to relieve all symptoms have all been discussed with the patient.    CHG wipes given and NPO was reviewed.  Postop course was reviewed.    This patient was seen and examined with Dr. Belma today.  All of the patient's questions were answered.      Duwaine ELINORE Chiles, PA-C, 02/16/2024

## 2024-02-23 ENCOUNTER — Ambulatory Visit
Admission: RE | Admit: 2024-02-23 | Discharge: 2024-02-23 | Disposition: A | Discharge status: Home / Self Care | Source: Ambulatory Visit

## 2024-02-23 ENCOUNTER — Encounter (HOSPITAL_BASED_OUTPATIENT_CLINIC_OR_DEPARTMENT_OTHER): Payer: Self-pay

## 2024-02-23 HISTORY — DX: Personal history of other diseases of the circulatory system: Z86.79

## 2024-02-23 NOTE — Discharge Instructions (Addendum)
 INSTRUES PR-OPERATRIAS DE CUIDADOS NO DIA DA CIRURGIA   SURGICAL DAY CARE PRE-OPERATIVE INSTRUCTIONS    DIA DA CIRURGIA  DAY OF SURGERY    Chegue ao registro do hospital em : [facility] (Thursday), [place] (September) 11    Arrive at hospital registration on (Day of the Week, Month, Day     O escritrio ligar no dia anterior ao seu procedimento para confirmar a Advertising account executive.   The office will call the day before your procedure to confirm your time to arrive     Voc PRECISA ter um adulto responsvel disponvel para acompanh-lo(a) para casa aps a cirurgia.  Voc no pode pegar um txi, Uber ou Lyft para casa sozinho(a), a menos que esteja acompanhado(a) de outro adulto  (sugerimos que voc tenha algum disponvel para ajud-lo[a] em casa aps a sua cirurgia).  You MUST have a responsible adult available to accompany you home after surgery.  You cannot take a taxi, [place] or Lyft home alone unless accompanied by another adult.  (We suggest you that you have someone available to assist you at home after your surgery)     INSTRUES:   INSTRUCTIONS:  Voc no pode comer nada aps a meia-noite da noite anterior  cirurgia.  You may not have anything to eat after midnight the night before your surgery.    Beba 32 oz (1 L) de fluidos ricos em eletrlitos nas 12 horas que antecedem a cirurgia, terminando cerca de 2 horas antes do horrio de incio da cirurgia (alm do protocolo ERAS).  Please drink 32 oz (1 L) of electrolyte rich fluid in the 12 hours leading up to surgery, to end by 2 hours before the start time of surgery (in addition to the ERAs protocol)    Voc pode tomar lquidos claros at duas horas antes do seu horrio de chegada.  Exemplos de lquidos claros so:  You may drink clear liquids up until two hours before your arrival time.  Examples of clear liquids are:   gua  water  bebidas eletrolticas como Gatorade, Gatorade Zero, Pedialyte ou vitaminwater;  electrolyte drinks  such as Gatorade, Gatorade Zero, Pedialyte, or Vitamin water  uma xcara de caf ou ch preto SEM CREME OU LEITE.  one cup of Black coffee or tea with NO CREAM OR MILK -   No fume nem use vape na manh da [place].  Do not smoke or vape the morning of your surgery  Deixe todos os objetos de valor em casa, incluindo joias, relgios e dinheiro.  Please leave all valuables at home, including jewelry, watches, money  Remova todo o [address] e no use maquiagem de nenhum tipo  Please remove all fingernail polish and do not wear any makeup  Se for fazer cirurgia ocular, no aplique maquiagem nos olhos ou no rosto e evite hidratantes faciais e perfumes.  If you are having eye surgery do not wear any eye makeup and avoid facial moisturizers and perfume  Retire peas ou extenses de cabelo que possam ser removidas.  Please take out any hair pieces or extensions that can be removed  No depile a rea em que ser realizada a lesotho.  Do not shave your surgical site  No utilize lentes de contato.  Do not wear contact lenses     MEDICAMENTOS:   MEDICATIONS:     POR FAVOR, tome os seguintes medicamentos na noite anterior  cirurgia:none   PLEASE Take the following medications the  night before your surgery :  Tome os seguintes medicamentos na manh da cirurgia, com apenas um gole de gua: none   Take the following medications the morning of your surgery with only a sip of water:

## 2024-02-23 NOTE — Surgery Pre-Op (Signed)
 PAT Visit          Kayla Garza is a 68 year old female received a preoperative screening.        Appointments for Next 30 Days 02/23/2024 - 03/24/2024        Date Visit Type Department Provider     03/12/2024  4:00 PM TELEVISIT [facility] - Pharmacotherapy Michiel Folks, PharmD    Patient Instructions:     Voc tem um prximo compromisso de videoteleviso com seu provedor de [employer]. Voc receber uma notificao sobre isso um dia antes da sua consulta. Trinta minutos antes [employer], voc receber um link para sua videoconferncia no [employer] e por mensagem de texto e e-mail antes do horrio agendado. O link orientar voc nas etapas para garantir que voc esteja configurado para se conectar com seu provedor no momento da visita e lhe dar acesso  visita por vdeo. Se voc tiver problemas para se conectar com seu provedor, ele ligar para voc no momento da sua visita, no nmero de telefone registrado em sua clnica.    Se precisar de ajuda para configurar seu telefone ou computador para visitas por vdeo, consulte nosso guia do usurio.                 03/14/2024 10:00 AM ORTHO EST [facility] [place] Bouchard, [place]    Patient Instructions:                       Surgery Information       Future Procedures (Tomorrow to 02/22/2025)         Date Time Visit Type/Procedure Providers Loc / Dept Status        03/01/2024 1240 EXCISION, GANGLION CYST, WRIST dorsal left wrist - Left [place], Debra A. [facility]      Visit Type/Procedure: EXCISION, GANGLION CYST, WRIST dorsal left wrist - Left                          Procedure(s):  EXCISION, GANGLION CYST, WRIST dorsal left wrist  03/01/2024        .     Language of care:Portuguese ([place])    Allergy History  Latex      Past Medical History:  05/25/2012: Allergic conjunctivitis  No date: Breast lump      Comment:  1/00 Bx > fibroadenoma  2000: Helicobacter pylori (H. pylori)      Comment:  s/p treatment for 2  months  No date: History of hypertension  05/25/2012: Hyperopia with astigmatism and presbyopia  1999: Other and unspecified ovarian cyst  05/25/2012: Pinguecula of both eyes     has a past surgical history that includes CRNEC TREPH B1 FLAP CRX EXC TUM STTL (1980); BRST RCNSTJ IMMT/DLYD W/TISS EXPANDER SBSQ XPNSJ (1990); Hysterectomy (1997); REDUCTION MAMMAPLASTY; Breast biopsy; and Root canal (1994).         Alcohol, Tobacco and Drug History:  Social History    Tobacco Use      Smoking status: Never      Smokeless tobacco: Never    E-Cigarette/Vaping  E-Cigarette Use: Never User  Quit Date:   Nicotine:   Start Date:   THC:    Cartridges/Day:   CBD:   Other Substance:   Flavoring:       Alcohol use   No       Drug  use:   No       Extended Emergency Contact Information  Primary Emergency Contact: Lampe,PEDRO  Address: [address]           [place], [place] [address] [place]  Home Phone: [phone]  Mobile Phone: [phone]  Relation: Husband        Current Outpatient Medications   Medication Sig    calcium carbonate + vitamin D  (CALTRATE 600+D PLUS) 600-800 MG-UNIT TABS per tablet Take 2 tablets by mouth daily    ferrous sulfate  325 (65 FE) MG EC tablet Take 1 tablet by mouth daily    lisinopril  (ZESTRIL ) 5 MG tablet Take 1 tablet by mouth daily     No current facility-administered medications for this encounter.         (Not in a hospital admission)      PAT Assessment    Airways symptoms: LOOSE TEETH OR DENTAL IMPLANTS: Yes    Activity tolerance (How many flights of stairs): 2    Assistive Device: None           PAT Screening     Row Name 02/23/24 1512       Integumentary - Complete full head to toe skin assessment    Integumentary (WDL) WDL    Row Name 02/23/24 1505       STOP-Bang Questionaire     Do you snore loudly (loud enough to be heard through closed doors or   your bed-partner elbows you for snoring at night? 0    Do you often feel Tired, fatigued, or Sleepy during the daytime  (such as   falling asleep when driving)? 0    Has anyone observed you stop breathing, or choking/gasping during your   sleep? 0    Do you have, or are being treated for, High Blood Pressure? 1    Height 5' (1.524 m)    Weight 71.2 kg (157 lb)    BMI (Calculated) 30.73    Neck size large? 0    Stop-Bang Score Low                       02/23/24  1505   Weight: 71.2 kg (157 lb)   Height: 5' (1.524 m)           Patient issues currently are  pre o pand all qyestions answered int . [facility]  Medication instructions  and . AVS  reviewed with  patient.   CHX wipes instructions were given and reviewed.       Jenkins Pearson, RN

## 2024-02-27 NOTE — Discharge Instructions (Signed)
 DISCHARGE INSTRUCTIONS   AFTER WRIST AND HAND SURGERY    Surgical procedure performed today: Ganglion excision, left      To decrease swelling, rest with your operated arm raised up on pillows with  your hand higher than your heart.    Ice Therapy: Use ice packs to the hand and wrist 20 minutes every hour. No  ice for the rest of that hour. (20 minutes on/ 40 minutes off).    Keep moving your fingers on your operative hand to avoid swelling and  stiffness. Try to make a full fist.    Leave the bandages and splint on until you have your appointment. If the  bandage feels too tight, have someone loosen it up and rewrap it.    It is normal to have some pain and swelling after surgery. It will get better  over time. If you lie down and raise your hand on pillows, it will feel better.     The numbing medicine put into your arm will slowly wear off. Start the  prescription medicine as soon as you start to feel the pain. Take the  medicine as directed. Do NOT take more medicine than you are  supposed to. Take less as you feel better. Stop when you are ready.     Diet: Drink extra fluid the first night. Start you usual diet when you feel like  eating again.    Bathing: Keep dressing and splint dry and out of the shower. Cover with a  plastic bag when you shower.     If you have a fever over 101 F, numbness or tingling that does not get better  each day, drainage that comes through the bandage,  nausea and vomiting  that lasts or pain that does not get better with prescribed medicine, call the  office.    [facility]  [address]  [place], [place] [address]  [phone] [facility]  [address]  [place], [place] [address]  [phone] option 4   [facility]  [address]  [place] [address]  [place], [place] [address]  [phone] [facility]  [address]  [place], [place] [address]  [phone]         After hours for all campuses (5:00 pm-8:30am) please call [phone].

## 2024-03-01 ENCOUNTER — Ambulatory Visit (HOSPITAL_BASED_OUTPATIENT_CLINIC_OR_DEPARTMENT_OTHER): Admitting: Anesthesiology

## 2024-03-01 ENCOUNTER — Encounter (HOSPITAL_BASED_OUTPATIENT_CLINIC_OR_DEPARTMENT_OTHER)
Admission: RE | Disposition: A | Discharge status: Home / Self Care | Payer: Self-pay | Source: Ambulatory Visit | Attending: Hand Surgery

## 2024-03-01 ENCOUNTER — Encounter (HOSPITAL_BASED_OUTPATIENT_CLINIC_OR_DEPARTMENT_OTHER): Payer: Self-pay | Admitting: Hand Surgery

## 2024-03-01 ENCOUNTER — Ambulatory Visit
Admission: RE | Admit: 2024-03-01 | Discharge: 2024-03-01 | Disposition: A | Discharge status: Home / Self Care | Payer: Self-pay | Attending: Hand Surgery | Admitting: Hand Surgery

## 2024-03-01 ENCOUNTER — Other Ambulatory Visit: Payer: Self-pay

## 2024-03-01 DIAGNOSIS — M67432 Ganglion, left wrist: Secondary | ICD-10-CM | POA: Diagnosis present

## 2024-03-01 DIAGNOSIS — I10 Essential (primary) hypertension: Secondary | ICD-10-CM | POA: Insufficient documentation

## 2024-03-01 DIAGNOSIS — Z79899 Other long term (current) drug therapy: Secondary | ICD-10-CM | POA: Insufficient documentation

## 2024-03-01 SURGERY — EXCISION, GANGLION CYST, WRIST
Anesthesia: General | Site: Wrist | Laterality: Left | Wound class: Class I/ Clean

## 2024-03-01 MED ORDER — SODIUM CHLORIDE 0.9 % MINI-BAG PLUS
2.0000 g | Freq: Once | INTRAVENOUS | Status: AC
Start: 2024-03-01 — End: 2024-03-01
  Administered 2024-03-01: 2 g via INTRAVENOUS
  Filled 2024-03-01: qty 20

## 2024-03-01 MED ORDER — FENTANYL CITRATE 0.05 MG/ML IJ SOLN
INTRAMUSCULAR | Status: AC
Start: 2024-03-01 — End: 2024-03-01
  Filled 2024-03-01: qty 2

## 2024-03-01 MED ORDER — LACTATED RINGERS IV BOLUS
INTRAVENOUS | Status: DC | PRN
Start: 2024-03-01 — End: 2024-03-01

## 2024-03-01 MED ORDER — MEPERIDINE HCL 50 MG/ML IJ SOLN
12.5000 mg | INTRAMUSCULAR | Status: DC | PRN
Start: 2024-03-01 — End: 2024-03-01

## 2024-03-01 MED ORDER — MIDAZOLAM HCL 2 MG/2 ML IJ SOLN
Freq: Once | INTRAMUSCULAR | Status: DC | PRN
Start: 2024-03-01 — End: 2024-03-01
  Administered 2024-03-01: 2 mg via INTRAVENOUS

## 2024-03-01 MED ORDER — LACTATED RINGERS IV SOLN
INTRAVENOUS | Status: DC
Start: 2024-03-01 — End: 2024-03-01

## 2024-03-01 MED ORDER — SODIUM CHLORIDE 0.9 % MINI-BAG PLUS
2.0000 g | Freq: Once | INTRAVENOUS | Status: DC
Start: 2024-03-02 — End: 2024-03-01

## 2024-03-01 MED ORDER — IBUPROFEN 600 MG PO TABS
600.0000 mg | ORAL_TABLET | Freq: Four times a day (QID) | ORAL | 0 refills | Status: AC | PRN
Start: 2024-03-01 — End: 2024-03-08

## 2024-03-01 MED ORDER — FENTANYL CITRATE 0.05 MG/ML IJ SOLN
25.0000 ug | INTRAMUSCULAR | Status: DC | PRN
Start: 2024-03-01 — End: 2024-03-01

## 2024-03-01 MED ORDER — ACETAMINOPHEN 500 MG PO TABS
1000.0000 mg | ORAL_TABLET | Freq: Three times a day (TID) | ORAL | 0 refills | Status: AC | PRN
Start: 2024-03-01 — End: 2024-03-08

## 2024-03-01 MED ORDER — OXYCODONE HCL 5 MG PO TABS
5.0000 mg | ORAL_TABLET | ORAL | 0 refills | Status: AC | PRN
Start: 2024-03-01 — End: 2024-03-08

## 2024-03-01 MED ORDER — ONDANSETRON HCL 4 MG/2ML IJ SOLN
4.0000 mg | Freq: Once | INTRAMUSCULAR | Status: DC | PRN
Start: 2024-03-01 — End: 2024-03-01

## 2024-03-01 MED ORDER — PROPOFOL 500 MG/50 ML IV
Freq: Once | INTRAVENOUS | Status: DC | PRN
Start: 2024-03-01 — End: 2024-03-01
  Administered 2024-03-01 (×3): 50 mg via INTRAVENOUS

## 2024-03-01 MED ORDER — PROPOFOL 200 MG/20ML IV EMUL
INTRAVENOUS | Status: AC
Start: 2024-03-01 — End: 2024-03-01
  Filled 2024-03-01: qty 20

## 2024-03-01 MED ORDER — BUPIVACAINE HCL (PF) 0.5 % IJ SOLN
INTRAMUSCULAR | Status: AC
Start: 2024-03-01 — End: 2024-03-01
  Administered 2024-03-01: 7 mL
  Filled 2024-03-01: qty 10

## 2024-03-01 MED ORDER — FENTANYL CITRATE 0.05 MG/ML IJ SOLN
Freq: Once | INTRAMUSCULAR | Status: DC | PRN
Start: 2024-03-01 — End: 2024-03-01
  Administered 2024-03-01 (×2): 50 ug via INTRAVENOUS

## 2024-03-01 MED ORDER — LIDOCAINE HCL (PF) 2 % IJ SOLN
INTRAMUSCULAR | Status: AC
Start: 2024-03-01 — End: 2024-03-01
  Filled 2024-03-01: qty 2

## 2024-03-01 MED ORDER — OXYCODONE-ACETAMINOPHEN 5-325 MG PO TABS
1.0000 | ORAL_TABLET | ORAL | Status: DC | PRN
Start: 2024-03-01 — End: 2024-03-01

## 2024-03-01 MED ORDER — MIDAZOLAM HCL 2 MG/2 ML IJ SOLN
INTRAMUSCULAR | Status: AC
Start: 2024-03-01 — End: 2024-03-01
  Filled 2024-03-01: qty 2

## 2024-03-01 SURGICAL SUPPLY — 11 items
18IN TOURNIQUET CUFF (SURGPNE) ×1 IMPLANT
CAPROSYN SUTURE 4-0 P13 (SUTURE) ×1 IMPLANT
CO-FLEX WRAP 1IN L/F (BANDAGE) ×1 IMPLANT
DRESSING SPONGE 4X4 (DRESSING) ×1 IMPLANT
PACK HAND (PACK) ×1 IMPLANT
STERILE LIGHTHANDLE COVER (SURGEQUP) ×1 IMPLANT
SURGEON GLOVE LF/PF 7 STER (GLOVE) ×1 IMPLANT
SURGEON GLOVE PF/LF 6.5 STER (GLOVE) ×1 IMPLANT
SUTURE POLYSORB 3-0 CV23 30IN (SUTURE) ×1 IMPLANT
SUTURE POLYSORB 4-0 CVF23 30 (SUTURE) ×1 IMPLANT
SYRINGE 10CC LL (SYRINGE) ×1 IMPLANT

## 2024-03-01 NOTE — Op Note (Signed)
 DATE OF SURGERY: 03/01/2024    PREOPERATIVE DIAGNOSIS: Left dorsal wrist ganglion cyst.     POSTOPERATIVE DIAGNOSIS: Left dorsal wrist ganglion cyst.     PROCEDURE: Excision of ganglion cyst, left dorsal wrist     TOURNIQUET TIME: 27 minutes     ANESTHESIA: MAC/LOCAL     INTRAVENOUS FLUIDS: 800 mL of lactated Ringer 's.     SURGEON: Adrien Mae, MD.     ASSISTANT: none     COMPLICATIONS: None.     SPECIMENS: Ganglion cyst.     INDICATIONS: This is a 68 year old female with a ganglion cyst on her left dorsal wrist. It is 1.5 cm in size, and patient would like to have it excised, as it is causing her discomfort.  Risks, benefits, and possible complications of the procedure including but not limited to damage to neurovascular structures, damage to the scapholunate ligament, recurrence of the cyst, residual stiffness in the wrist, and infection were all explained to the patient. Patient understood all these and gave consent.     DESCRIPTION OF PROCEDURE: The patient was brought to the operating room and placed on the operating room table in the supine position. Tourniquet was placed on the left upper extremity.  Left upper extremity was prepped and draped in the usual sterile manner.     IV antibiotics were administered.     Local anesthetic consisting of 0.5% Marcaine  plain was used to provide anesthesia to the area of the proposed incision. Forearm was exsanguinated using Esmarch bandage, and tourniquet was elevated to 250 mmHg.     A transverse incision was made directly over the dorsum of the wrist over the ganglion cyst. Dissection was carried through the skin. The ganglion cyst was revealed. The ganglion cyst was dissected carefully down to the joint and removed completely. A curette was used to scrape the soft tissue in the area of origin of the cyst, and electrocautery was used to cauterize the tissue base from where the cyst originated. The cyst came from the radiocarpal joint in the area of the scapholunate  ligament. The scapholunate ligament was tested and was completely intact.     The wound was copiously irrigated. Electrocautery was used to gain control of any bleeders. The skin was then closed with 4-0 Vicryl, then 4-0 Caprosyn suture. Some additional 0.5% Marcaine  plain was placed into the wound at the end. Dry sterile dressing was applied. Tourniquet was released. The patient had excellent capillary refill after release of tourniquet. The patient was then taken from the operating to the recovery room in excellent condition.

## 2024-03-01 NOTE — Anesthesia Preprocedure Evaluation (Signed)
 Pre-Anesthetic Note  .      Patient: Kayla Garza is a 68 year old female      Procedure Information       Date/Time: 03/01/24 1240    Procedure: EXCISION, GANGLION CYST, WRIST dorsal left wrist (Left)    Diagnosis: Ganglion cyst of dorsum of left wrist [M67.432]    Pre-op diagnosis: Ganglion cyst of dorsum of left wrist [M67.432]    Location: [facility]    Surgeons: Belma Adrien LABOR., MD            Relevant Problems   CARDIO   (+) Essential hypertension      GU/RENAL   (+) Renal anomaly      Other   (+) Osteoarthritis of both knees           Previous Anesthetic History:   Past Surgical History:  No date: BREAST BIOPSY      Comment:  negative right  needle Bx  1990: BRST RCNSTJ IMMT/DLYD W/TISS EXPANDER SBSQ XPNSJ      Comment:  s/p removal of excess skin  1980: CRANIEC TREPHINE BONE FLP BRAIN TUMOR SUPRTENTOR      Comment:  craniotomy age 31, tumor pituitary  1997: HYSTERECTOMY      Comment:  surgery for uterine fibroids. 7/01 Pap of  vag cuff                Negative; ovaries remain  No date: REDUCTION MAMMAPLASTY  1994: ROOT CANAL        Medications  Current Facility-Administered Medications   Medication    [START ON 03/02/2024] ceFAZolin  (ANCEF ) 2 g in sodium chloride  0.9 % 100 mL minibag plus    lactated ringers  infusion    lactated ringers  infusion         Allergies:   Review of Patient's Allergies indicates:   Latex                   Rash    Smoking, Alcohol, Drugs:  Social History    Tobacco Use      Smoking status: Never      Smokeless tobacco: Never    Alcohol use: No      Drug use:   No       PMHx:  Past Medical History:  05/25/2012: Allergic conjunctivitis  No date: Breast lump      Comment:  1/00 Bx > fibroadenoma  2000: Helicobacter pylori (H. pylori)      Comment:  s/p treatment for 2 months  No date: History of hypertension  05/25/2012: Hyperopia with astigmatism and presbyopia  1999: Other and unspecified ovarian cyst  05/25/2012: Pinguecula of both eyes    Vitals  BP 177/89   Pulse 62    Temp 98.3 F (36.8 C) (Temporal)   Resp 25   Ht 5' 1 (1.549 m)   Wt 71.7 kg (158 lb)   SpO2 99%   BMI 29.85 kg/m     Anesthesia Evaluation    Physical Exam:     General     Level of consciousness:  Alert and oriented (time, person, place)     BMI       Airway     Mallampati:  III    TM distance:  >3 FB    Mouth opening:  >3 FB    Neck ROM:  Full     Teeth       Heart  - normal exam  Lungs - normal exam          HEENT:      Abdomen:        Other findings: Upper denture plate.  None loose per pt.                        Pertinent Labs:   Lab Results   Component Value Date    NA 141 10/31/2023    K 4.6 10/31/2023    CREAT 0.8 10/31/2023    GLUCOSER 96 10/31/2023    WBC 6.5 10/31/2023    HCT 39.8 10/31/2023    PLTA 317 10/31/2023       EKG:   Results for orders placed or performed in visit on 09/20/16   EKG    Collection Time: 09/20/16 12:00 AM   Result Value Ref Range    EKG REPORT       [facility]  [facility]  [address]  [place], [place]  [address]  CARDIOVASCULAR LABORATORY     [phone]     PATIENT: ERLING IP DA SILVA    DOB: [birth year]  AGE: 65   SEX: F    ACCT#: [id number]  LOCATION: [facility]    UNIT#: [id number]  STATUS: REG CLI    DATE:  09/20/16  1244    ORD PHY: CONSUELO LITTIE BOSCH MD    PCP: CONSUELO LITTIE BOSCH, MD      Report Status: Signed        EKG REPORT        Test Reason : PALPITATIONS    Blood Pressure :  mmHG    Vent. Rate : 068 BPM     Atrial Rate : 068 BPM    P-R Int : 150 ms          QRS Dur : 086 ms    QT Int : 392 ms       P-R-T Axes : 024 -08 003 degrees    QTc Int : 416 ms      Normal sinus rhythm    Moderate voltage criteria for LVH, may be normal variant    Borderline ECG    When compared with ECG of 07-Jan-2011 14:45,    No significant change was found      Referred By: CONSUELO BOSCH           Confirmed Ab:IZZEJX THATAI      TranscriptionistBETHA BIS  Report #: [id number]         PFTs:   No results found for this or any previous  visit.      Anesthesia Plan    ASA Score:     ASA:  2    Airway:      Mallampati:  III    Mouth opening:  >3 FB    Neck ROM:  Full    TM distance:  >3 FB     Plan: general    Other information:     EKG Reviewed: : Yes      Post-Plan::  PACU    Informed Consent:     Anesthetic plan and risks discussed with:  Patient   Patient Consented

## 2024-03-01 NOTE — Anesthesia Postprocedure Evaluation (Addendum)
 Anesthesia Post-Operative Evaluation Note    Patient: Kayla Garza           Procedure Summary       Date: 03/01/24 Room / Location: [facility]    Anesthesia Start: 1232 Anesthesia Stop:     Procedure: EXCISION, GANGLION CYST, WRIST dorsal left wrist (Left: Wrist) Diagnosis:       Ganglion cyst of dorsum of left wrist      (Ganglion cyst of dorsum of left wrist [F32.567])    Surgeons: Belma Adrien LABOR., MD Responsible Provider: Lala Norleen RAMAN, MD    Anesthesia Type: general ASA Status: 2              POST-OPERATIVE EVALUATION    Anesthesia Post Evaluation    Vitals signs in patient's normal range: Yes  Respiratory function stable; airway patent: Yes  Cardiovascular function stable: Yes  Hydration status stable: Yes  Mental status recovered; patient participates in evaluation and/or is at baseline: Yes  Pain control satisfactory: Yes  Nausea and vomiting control satisfactory: YesProcedure was labor & delivery no  PostOP disposition PACU  Anesthesia Observation no significant observation    MIPS#404 Anesthesiology Smoking Abstinence:     The patient is a current smoker (e.g. cigarette, cigar, pipe, e-cigarette/vaping/marijuana) (KK595): No   FOR CODING USE ONLY: IF BLANK K9595    MIPS#477 Multimodal Pain Management:  Emergent - Exclusion case: No  Patient was administered multimodal pain management (two or more drugs and/or interventions excluding systemic opioids) in the perioperative period; occurring at some time between 6 hours prior to anesthesia start time until discharged from: No   List medical reason(s) patient did not receive multimodal pain management (H7850): no pain anticipated postop  Other reason: local by surgeon    FOR CODING USE ONLY: REASON NOT LISTED H7849    FPED#575 Perioperative Temperature Management:  Anesthesia start to Anesthesia end time was 60 minutes or longer (4256F): No   FOR CODING USE ONLY: REASON NOT LISTED H0226    MIPS#430 Adult Prevention of PONV:  Patient  received an inhalational anesthetic (KK569): No  FOR CODING USE ONLY: REASON NOT LISTED H0222    FPED#536 Pediatric Prevention of PONV:  Pediatric patient?: No  FOR CODING USE ONLY: REASON NOT LISTED H0041        [employer] # [id number]        Last vitals    BP: 177/89 (03/01/2024 11:15 AM)  Temp: 98.3 F (36.8 C) (03/01/2024 11:15 AM)  Pulse: 62 (03/01/2024 11:15 AM)  Resp: 25 (03/01/2024 11:15 AM)  SpO2: 99 % (03/01/2024 11:15 AM)

## 2024-03-01 NOTE — H&P (Signed)
 HAND SURGERY PREOPERATIVE H&P     Diagnosis left wrist dorsal ganglion     HPI- This is a 68 year old year old RHD female who presents today for evaluation of her left wrist dorsal ganglion.  She has been followed by us  since January 2025.  She would now like to have this surgically excised because it continues to bother her and is painful. Denies numbness in the left upper extremity.       Past Medical History:     05/25/2012: Allergic conjunctivitis  No date: Breast lump      Comment:  1/00 Bx > fibroadenoma  2000: Helicobacter pylori (H. 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 7/01 Pap of  vag cuff                Negative; ovaries remain  No date: REDUCTION MAMMAPLASTY  1994: ROOT CANAL       Review of Patient's Allergies indicates:     No Known Allergies           Social History-Works in housekeeping at [employer]  Social History    Tobacco Use      Smoking status: Never      Smokeless tobacco: Never    Alcohol use: No            Prescriptions Prior to Admission   (Not in a hospital admission)         Review of Systems:  Gen- Negative for fevers, chills, recent illnesses.    Patient Active Problem List:        Overweight (BMI 25.0-29.9)     Vitamin D  deficiency     Ovarian cyst     Allergic rhinitis     Renal anomaly     Pinguecula of both eyes     Allergic conjunctivitis     Hyperopia with astigmatism and presbyopia     History of Cushing disease     Osteoarthritis of both knees     Coccyx pain      Laryngopharyngeal reflux (LPR)     Palpitations     Goiter     S/P hysterectomy     History of hyperthyroidism     Epidermal cyst     Osteopenia of multiple sites     Neck pain, acute     Pain and numbness of left upper extremity     Cervical stenosis of spinal canal     Essential hypertension     Home BP machine (validated 11/04/23)     Ganglion cyst of dorsum of left wrist            Physical Exam:   Interpreter-Portuguese phone ID [id number]  BP 131/82 (Site: LA, Position: Sitting, Cuff Size: Reg)   Pulse 67   Temp 97.7 F (36.5 C) (Temporal)   Resp 18  Ht 5' 0.43 (1.535 m)   Wt 71.6 kg (157 lb 12.8 oz)   SpO2 97%   BMI 30.38 kg/m   Pain Score: Data Unavailable      Gen-no apparent distress, alert and x 3  HEENT- NCAT, oropharynx moist, neck supple, sclerae anicteric bilat      Lungs-breathing comfortably on room air  Ext- No rashes or lesions.  At the left wrist she has full range of motion.  There is a 1.5 cm dorsal mass at the radiocarpal joint consistent with a ganglion.  It is tender to palpation and most visible with the wrist in flexion  2+ radial pulse left upper extremity  Sensation intact in median, radial and ulnar nerve distributions in the left upper extremity  ROM-full in the left hand and wrist     Imaging-x-rays dated 07/21/2023 of the left wrist show no soft tissue or bony abnormality  All pertinent imaging was also reviewed by Dr. Belma today.     A/P- This is a 68 year old year old female with a painful chronic left wrist dorsal ganglion.  The diagnosis is reviewed.  She would like to have this excised.      Indications for surgery, alternative methods of treatment, including no treatment, risks involved and the possibility of complications including infection, nerve/ tendon injury, stiffness, recurrence, need for further surgery, failure to relieve all symptoms have all been discussed with the patient.     Patient reports no change in her health since seen in the clinic. She has been  compliant with preop instructions including use of the wipes and NPO since MN.    This patient was seen pre-op by Dr. Belma today.  All of the patient's questions were answered.      Ozell FORBES Chancy, PA-C  03/01/24

## 2024-03-02 NOTE — Surgery Post-Op (Signed)
 Procedure(s):  EXCISION, GANGLION CYST, WRIST dorsal left wrist      Pre-op Diagnosis      * Ganglion cyst of dorsum of left wrist [M67.432]     Post-op Diagnosis     * Ganglion cyst of dorsum of left wrist [F32.567]    Surgeon: Primary: Belma Adrien LABOR., MD    Assistant (if applicable): * No surgeons found with a matching role *    Type of Anesthesia:  Monitor Anesthesia Care      POST OP CALL:        Interpreter used?:  Yes    Name/Number:  [id number] isabel    Able to speak with Patient:  Yes    Did you get your discharge instructions?: Yes      Do you have any questions about the instructions?: No      Are you in Pain: No      Taking pain medicine?: No      no fever, , no drainage from dressing and no trouble urinating      All normal      Tolerating Solid Foods: Yes      Any questions for nurse?: Yes            Asked if she needed to pick up oxycodone  if motrin  controlling pain well.    Overall Surgical Experience Score (from admission to discharge):  10    Post-op appt scheduled? Yes         Red Maus, RN

## 2024-03-05 LAB — SURGICAL PATH SPECIMEN SOFT TISSUE

## 2024-03-12 ENCOUNTER — Ambulatory Visit

## 2024-03-12 DIAGNOSIS — I10 Essential (primary) hypertension: Secondary | ICD-10-CM

## 2024-03-12 DIAGNOSIS — Z9289 Personal history of other medical treatment: Secondary | ICD-10-CM

## 2024-03-12 NOTE — Progress Notes (Signed)
 Hypertension Management Clinic Visit  Condition(s) addressed at this pharmacotherapy visit: Bill By Complexity/Medical Decision Making (MDM) and Hypertension/Elevated BP (MDM)     Translator used for this visit: Yes, [place]. [id number]    SUBJECTIVE   Kayla Garza is a 68 year old female with uncontrolled hypertension who presents to clinic forfollow-up HTN management.     Last pharmacotherapy visit was on  11/04/23  Changes made during visit included:   none    Medications Management:  Reconciled with pt verbally  Manages medication themselves.    HTN Regimen:  lisinopril  5mg  daily    Stress and a lot of problems    Adherence: Patient reports taking medication every day    Self-Monitoring of Blood Pressure (SMBP):   Last validated in clinic - In past year, date: 11/04/23  Mirco brand   SBP DBP   02/29/24 137 85   03/10/24 128 72   03/12/24 138 83       Signs/symptoms of hypotension: None reported  Signs/symptoms of hypertension: None reported    Lifestyle:  Diet:   - Caffeine: coffee   - Sodium: home cooked meals - no, eat at work    Physical activity:  home elliptical machine 3-4 times a week for 1 hour    OBJECTIVE       Electrolytes  Lab Results   Component Value Date    NA 141 10/31/2023    K 4.6 10/31/2023    CL 104 10/31/2023    CA 9.4 10/31/2023     Kidney Function  Lab Results   Component Value Date    BUN 13 10/31/2023    CREAT 0.8 10/31/2023    GFR > 60 10/31/2023           Blood Pressure    Most Recent BP Reading(s)  03/01/24 : 176/82  02/16/24 : 131/82  12/28/23 : 148/82      Heart Rate  Most Recent Pulse Reading(s)  03/01/24 : (!) 46  02/16/24 : 71  12/28/23 : 64             ASSESSMENT/RECOMMENDATIONS   BLOOD PRESSURE  Education:   Recommended to not consume more than 2400mg  of sodium/day and to try to reduce sodium intake to 1500mg /day and Recommended to engage in regular aerobic physical activity such as brisk walking for at least 30 min/day most days of the week  Reviewed blood pressure goals,  benefits of optimal control, symptoms of hypertension and hypotension, side effects of drug treatment, drug mechanisms of action, duration of treatment, risk of complications, pathophysiology, complementary lifestyle modifications, and prospect of self measurement of blood pressure.    Today's self reported limited blood pressure was just above goal  (130/80 (per ACC/AHA guidelines). Next due: Next visit   SMBP machine:  next validation due 10/2024  Advised to check and record BP readings  once a week due to patient's schedule    Assessment/Intervention:  patient would like to focus on lifestyle modifications. Will adjust at next visit if BP continues to be elevated.  CONTINUE lisinopril  5mg  daily    RENAL  Last BMP WNL, Scr 0.8 eGFR >60 mL/min on 10/31/23    MEDICATION RECONCILIATION  Medication reconciliation was completed today verbally  Refills submitted: none    Will discuss any lab results at next visit or sooner if clinically indicated.    Planned return date:  Future Appointments   Date Time Provider Department Center   04/24/2024  4:00 PM  Candyce Garre, DO [facility] AMB   04/25/2024  3:40 PM Charika, Mikelson, APRN [facility] CARE [facility]   04/30/2024  4:00 PM Michiel Folks, PharmD [facility]       Encouraged patient to keep scheduled pharmacotherapy visits and to call site pharmacist with questions. Provided verbal and written dosing instructions. Pt verbalized understanding of everything discussed and agrees with plan. Pt was given the opportunity to ask questions/voice concerns.

## 2024-03-14 ENCOUNTER — Other Ambulatory Visit: Payer: Self-pay

## 2024-03-14 ENCOUNTER — Ambulatory Visit: Attending: Physician Assistant | Admitting: Physician Assistant

## 2024-03-14 DIAGNOSIS — M67432 Ganglion, left wrist: Secondary | ICD-10-CM | POA: Diagnosis not present

## 2024-03-14 NOTE — Progress Notes (Signed)
 HAND SURGERY POSTOP VISIT    Diagnosis-status post excision ganglion cyst dorsum left hand  Date of Surgery -03/01/2024      Interim History-patient is 2 weeks postop.  She has no complaints.  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    Medications and allergies were reviewed and updated in EPIC today.    Exam- Interpreter-declined, uses English effectively  There were no vitals taken for this visit.  Pain Score: Data Unavailable          Gen- NAD, A&Ox3  Ext- Incision at the radial dorsal  aspect of the left wrist lis clean, dry and intact. No drainage.  Full mobility of wrist and fingers.      Pathology: ganglion cyst and reviewed with patient        A/P-  This is a 68 year old year old female 2 weeks postop.  Patient is doing well postop.  Massage for desensitization over incision is recommended.  They can use the hand as tolerated although she will avoid any heavy lifting for the next few weeks.  She is cleared to return to work October 27 and paperwork is completed and a letter is given today.    Seen in PA clinic.    Follow-up as needed  She is reminded there is about a 15% recurrence rate    Duwaine ELINORE Chiles, PA-C, 03/14/2024

## 2024-04-24 ENCOUNTER — Ambulatory Visit
Admission: RE | Admit: 2024-04-24 | Discharge: 2024-04-24 | Disposition: A | Discharge status: Home / Self Care | Attending: Endocrinology | Admitting: Endocrinology

## 2024-04-24 ENCOUNTER — Ambulatory Visit

## 2024-04-24 ENCOUNTER — Encounter (HOSPITAL_BASED_OUTPATIENT_CLINIC_OR_DEPARTMENT_OTHER): Payer: Self-pay

## 2024-04-24 ENCOUNTER — Other Ambulatory Visit: Payer: Self-pay

## 2024-04-24 VITALS — BP 147/83 | HR 75 | Temp 98.5°F | Wt 157.8 lb

## 2024-04-24 DIAGNOSIS — Z780 Asymptomatic menopausal state: Secondary | ICD-10-CM | POA: Diagnosis not present

## 2024-04-24 DIAGNOSIS — Z8639 Personal history of other endocrine, nutritional and metabolic disease: Secondary | ICD-10-CM | POA: Diagnosis not present

## 2024-04-24 DIAGNOSIS — R5383 Other fatigue: Secondary | ICD-10-CM | POA: Insufficient documentation

## 2024-04-24 DIAGNOSIS — R7303 Prediabetes: Secondary | ICD-10-CM | POA: Diagnosis present

## 2024-04-24 DIAGNOSIS — M8580 Other specified disorders of bone density and structure, unspecified site: Secondary | ICD-10-CM | POA: Insufficient documentation

## 2024-04-24 DIAGNOSIS — R5381 Other malaise: Secondary | ICD-10-CM | POA: Insufficient documentation

## 2024-04-24 LAB — HEMOGLOBIN A1C
ESTIMATED AVERAGE GLUCOSE: 120 mg/dL (ref 74–160)
HEMOGLOBIN A1C: 5.8 % — ABNORMAL HIGH (ref 4.0–5.6)

## 2024-04-24 LAB — TSH (THYROID STIMULATING HORMONE): TSH (THYROID STIM HORMONE): 3.97 u[IU]/mL (ref 0.270–4.200)

## 2024-04-24 LAB — VITAMIN D,25 HYDROXY: VITAMIN D,25 HYDROXY: 27 ng/mL — ABNORMAL LOW (ref 30.0–100.0)

## 2024-04-24 LAB — IRON: IRON: 52 ug/dL (ref 50–170)

## 2024-04-24 NOTE — Assessment & Plan Note (Signed)
-  pt has multiple non specific symptoms  -will repeat TSH this visit

## 2024-04-24 NOTE — Progress Notes (Addendum)
 Endocrinology Clinic     HPI:  Kayla Garza is a 68 year old female Cushing's disease s/p transphenoidal surgery in remission and subclinical hyperthyroidism here for follow up. Last visit 05/2023.      Portuguese interpreter ID: [phone]    Pertinent history:   Subclinical hyperthyroidism history: pt complained since 02/2012 of hair loss and tired legs. TSH was found to be low at 0.17.   -03/2012 neck US  found heterogeneous hypervascular thyroid  without nodules. Thyroid  scan was non focal  -04/2012-07/2012, was on methimazole  trial without improvement  -07/2012 TSI was negative  -04/2016 anti-TG was negative  -05/2021 had reported palpitations with slightly elevated TSH   -05/2022 reported fatigue and heat, but TSH was normal    Cushing's disease: s/p transphenoidal surgery in [place] 1980's. Repeat workup 2012, 2014 revealed normal pituitary function and no residual disease.     Osteopenia:   10/22 BMD: T-1.9 spine, T-2.0 left FN; T- 1.7 right FN, FRAX 9% and 1.3%  10/24 BMD: T-2.1 spine, T-1.8 left FN, T-1.9 right FN (decr 4%); FRAX 10% and 0.7%    Today, pt feels well. She denies changes in bowel habits, dramatic weight changes, compressive symptoms of the neck. She denies cold intolerance. Has intermittent heat waves without diaphoresis.   She reports fragility in nail texture and some hair loss.   She states that she has iron deficiency and is getting treatment. She reports some mid-day fatigue.      Meds  Current Outpatient Medications   Medication Instructions    calcium carbonate + vitamin D  (CALTRATE 600+D PLUS) 600-800 MG-UNIT TABS per tablet 2 tablets, Oral, DAILY    lisinopril  (ZESTRIL ) 5 mg, Oral, DAILY        Review of Systems  10-point review of systems evaluated and negative except as mentioned in HPI.     Vital Signs  BP 147/83 (Site: RA, Position: Sitting, Cuff Size: Reg)   Pulse 75   Temp 98.5 F (36.9 C) (Temporal)   Wt 71.6 kg (157 lb 12.8 oz)   SpO2 100%   BMI 29.82 kg/m   Body  mass index is 29.82 kg/m.     Physical Exam     GENERAL: awake, NAD  HEENT: NCAT, EOMI, MMM  NECK: supple, no LAD, no goiter  CHEST: comfortable respirations on room air  EXT: warm, well-perfused  SKIN: Dry and warm  NEURO: moves all extremities  PSYCH: appropriate affect     Assessment/Plan:  68 year old female Cushing's disease s/p transphenoidal surgery in remission and subclinical hyperthyroidism here for follow up.    Assessment & Plan  History of Cushing disease  -s/p transphenoidal resection 1980's  -currently in remission  -pt had history of prediabetes. Will recheck A1c this visit.   History of hyperthyroidism  -pt has multiple non specific symptoms  -will repeat TSH this visit  Osteopenia after menopause  -family history of osteoporosis in mother  -osteopenia noted on DXA   -03/2023 DXA was stable; Low FRAX, thus no indication for treatment  -discussed physical activity and optimizing vitamin D   -repeat DXA in 2026  -will check vitamin D  level  Prediabetes  -as above  Malaise and fatigue  -pt still reported fatigue, will recheck iron     Follow up:   RTC 1 year     Nat Donalds, DO  Division of Endocrinology    Time spent = 45 minutes (greater than 50% time devoted to counseling, education, coordination of care and  reviewing chart). Complexity of medical decision making = moderate

## 2024-04-24 NOTE — Assessment & Plan Note (Signed)
-  pt still reported fatigue, will recheck iron

## 2024-04-24 NOTE — Assessment & Plan Note (Addendum)
-  family history of osteoporosis in mother  -osteopenia noted on DXA   -03/2023 DXA was stable; Low FRAX, thus no indication for treatment  -discussed physical activity and optimizing vitamin D   -repeat DXA in 2026  -will check vitamin D  level

## 2024-04-24 NOTE — Assessment & Plan Note (Signed)
-->   as above

## 2024-04-24 NOTE — Assessment & Plan Note (Signed)
-  s/p transphenoidal resection 1980's  -currently in remission  -pt had history of prediabetes. Will recheck A1c this visit.

## 2024-04-25 ENCOUNTER — Ambulatory Visit: Attending: Internal Medicine | Admitting: Internal Medicine

## 2024-04-25 ENCOUNTER — Encounter (HOSPITAL_BASED_OUTPATIENT_CLINIC_OR_DEPARTMENT_OTHER): Payer: Self-pay | Admitting: Internal Medicine

## 2024-04-25 ENCOUNTER — Telehealth (HOSPITAL_BASED_OUTPATIENT_CLINIC_OR_DEPARTMENT_OTHER): Payer: Self-pay | Admitting: Internal Medicine

## 2024-04-25 VITALS — BP 139/80 | HR 80 | Temp 96.7°F | Ht 60.43 in | Wt 157.0 lb

## 2024-04-25 DIAGNOSIS — E611 Iron deficiency: Secondary | ICD-10-CM | POA: Insufficient documentation

## 2024-04-25 DIAGNOSIS — Z7185 Encounter for immunization safety counseling: Secondary | ICD-10-CM | POA: Diagnosis present

## 2024-04-25 DIAGNOSIS — R7303 Prediabetes: Secondary | ICD-10-CM | POA: Insufficient documentation

## 2024-04-25 DIAGNOSIS — F4320 Adjustment disorder, unspecified: Secondary | ICD-10-CM | POA: Diagnosis present

## 2024-04-25 DIAGNOSIS — I10 Essential (primary) hypertension: Secondary | ICD-10-CM | POA: Insufficient documentation

## 2024-04-25 MED ORDER — FERROUS SULFATE 325 (65 FE) MG PO TBEC
325.0000 mg | DELAYED_RELEASE_TABLET | Freq: Every day | ORAL | 3 refills | Status: AC
Start: 2024-04-25 — End: 2025-04-20

## 2024-04-25 MED ORDER — LISINOPRIL 10 MG PO TABS
10.0000 mg | ORAL_TABLET | Freq: Every day | ORAL | 5 refills | Status: AC
Start: 2024-04-25 — End: 2024-10-22

## 2024-04-25 NOTE — Progress Notes (Signed)
 Kayla Garza is a 68 year old female   [place], I last saw her in July/25    HTN - engaged with Clinical Pharm, last visit in Sept/25  Appt with Endocrine 11/4 for osteopenia  immuniz      Both parents very ill  Mother needed 2 legs amputated, not diabetic, vascular issues. Age 15  Father with recurrent UTI d/t catheter, he's 22  She was in [place] x20d in [place] w/them  Youngest sister in [place] now, taking care of them  Parents are divorced, younger sist with dad  Brother taking care of mother  Denica is sending financial support  Stressful times  Not sleeping well, a lot in her mind    #) HTN  Pt reports taking medications daily. Denies chest pain, SOB, DOE, palpitations, dizziness, head aches, leg edema.     Most Recent BP Reading(s)  04/25/24 : 139/80  04/24/24 : 147/83  03/01/24 : 176/82  02/16/24 : 131/82  12/28/23 : 148/82    HEMOGLOBIN A1C (%)   Date Value   04/24/2024 5.8 (H)   05/30/2023 5.8 (H)   03/22/2022 5.9 (H)     Most Recent Weight Reading(s)  04/25/24 : 71.2 kg (157 lb)  04/24/24 : 71.6 kg (157 lb 12.8 oz)  03/01/24 : 71.7 kg (158 lb)  02/23/24 : 71.2 kg (157 lb)  02/16/24 : 71.6 kg (157 lb 12.8 oz)    Review of symptoms:  No fevers or unexplained weight loss. No visual changes. No sore throat or ear ache.  No prolonged cough. No dyspnea or chest pain on exertion.  No abdominal pain or change in bowel habits.  No vaginal discharge or dysuria.  No new or unusual musculoskeletal symptoms. No new rashes or skin changes.  No pain or masses in breasts. No paresthesias or unusual headaches. No sadness or anxiety that interferes with day-to-day activities. No hot flashes. No enlarged nodes.  No new itching, sneezing or wheezing.    OBJECTIVE:  BP 139/80 (Site: RA, Position: Sitting, Cuff Size: Reg)   Pulse 80   Temp 96.7 F (35.9 C) (Temporal)   Ht 5' 0.43 (1.535 m)   Wt 71.2 kg (157 lb) Comment: with shoes  SpO2 98%   BMI 30.23 kg/m   Pleasant, in NAD  Heart: S1 and S2 normal,  no murmurs, clicks, gallops or rubs. Regular rate and rhythm.   Lungs:  clear; no wheezes, rhonchi or rales.      ASSESSMENT & PLAN:  (I10) Essential hypertension  (primary encounter diagnosis)  Comment: well controlled, continue current regimen. Needs refills. Continue working closely with PharmD team, next appt 04/30/24  Plan: lisinopril  (ZESTRIL ) 10 MG tablet          (R73.03) Prediabetes  Comment: 5.8 yesterday, same value as Dec/24  Plan: Life style, recheck yearly    (E61.1) Low iron  Comment: remains in the 50s range  Continue oral supplementation  Plan: ferrous sulfate  325 (65 FE) MG EC tablet            (F43.20) Adjustment disorder, unspecified type  Comment: Extensive supportive listening, validation of feelings  Plan: Continuous support, pt focused now on working to continue supporting parents financially.    (Z71.85) Immunization counseling  Comment:   Plan: PCV20 VACCINE (PNEUMOCOCCAL/PREVNAR 20),         MODERNA > 12 YO COVID-19          The patient was ready to learn and no apparent  learning or adherence barriers were identified. I explained the diagnosis and treatment plan, and the patient expressed understanding of the content. I attempted to answer any questions regarding the diagnosis and the proposed treatment.    We discussed the patient's current medications.  We discussed the importance of medication compliance. The patient expressed understanding and no barriers to adherence were identified.    follow-up prn  she has been advised to call or return with any worsening or new problems    I spent a total of 35 minutes on this visit on the date of service (total time includes all activities performed on the date of service)

## 2024-04-25 NOTE — Telephone Encounter (Signed)
 NURSE called [facility] to complete a benefit analysis for the   PCV20  B Vaccine.     The vaccine is covered under the patient's [facility] medical coverage.    Please choose Private    Thank you

## 2024-04-26 ENCOUNTER — Other Ambulatory Visit (HOSPITAL_BASED_OUTPATIENT_CLINIC_OR_DEPARTMENT_OTHER): Payer: Self-pay

## 2024-04-30 ENCOUNTER — Ambulatory Visit: Attending: Internal Medicine

## 2024-04-30 DIAGNOSIS — Z9289 Personal history of other medical treatment: Secondary | ICD-10-CM | POA: Insufficient documentation

## 2024-04-30 DIAGNOSIS — I10 Essential (primary) hypertension: Secondary | ICD-10-CM | POA: Insufficient documentation

## 2024-04-30 NOTE — Progress Notes (Unsigned)
 Hypertension Management Clinic Visit  Condition(s) addressed at this pharmacotherapy visit: Bill By Complexity/Medical Decision Making (MDM) and Hypertension/Elevated BP (MDM)     Translator used for this visit: Yes, Portuguese. PA21    SUBJECTIVE   Kayla Garza is a 68 year old female with uncontrolled hypertension who presents to clinic forfollow-up HTN management.     Last pharmacotherapy visit was on 03/12/24  Changes made during visit included:   none    Medications Management:  Reconciled with pt verbally  Manages medication themselves.    HTN Regimen:  lisinopril  10mg  daily     Stress and a lot of problems    Adherence: Patient reports taking medication every day    Self-Monitoring of Blood Pressure (SMBP):   Last validated in clinic - In past year, date: 11/04/23  Mirco brand   SBP DBP   04/13/24 142 74   04/16/24 131 73   04/17/24 131 74   04/22/24 139 72   04/24/24 135 80   AVG 135.6 74.6     04/25/24 saw PCP and lisinopril  increased 5 to 10mg  daily  Reproted that she felt like head was floating, but improved since then     Signs/symptoms of hypotension: None reported   Signs/symptoms of hypertension: None reported    Lifestyle:  Diet:   - Caffeine: coffee   - Sodium: home cooked meals - no, eat at work    Physical activity:  home elliptical machine 3-4 times a week for 1 hour    OBJECTIVE       Electrolytes  Lab Results   Component Value Date    NA 141 10/31/2023    K 4.6 10/31/2023    CL 104 10/31/2023    CA 9.4 10/31/2023     Kidney Function  Lab Results   Component Value Date    BUN 13 10/31/2023    CREAT 0.8 10/31/2023    GFR > 60 10/31/2023           Blood Pressure    Most Recent BP Reading(s)  04/25/24 : 139/80  04/24/24 : 147/83  03/01/24 : 176/82      Heart Rate  Most Recent Pulse Reading(s)  04/25/24 : 80  04/24/24 : 75  03/01/24 : (!) 46             ASSESSMENT/RECOMMENDATIONS   BLOOD PRESSURE  Education:   Recommended to not consume more than 2400mg  of sodium/day and to try to reduce sodium  intake to 1500mg /day and Recommended to engage in regular aerobic physical activity such as brisk walking for at least 30 min/day most days of the week  Reviewed blood pressure goals, benefits of optimal control, symptoms of hypertension and hypotension, side effects of drug treatment, drug mechanisms of action, duration of treatment, risk of complications, pathophysiology, complementary lifestyle modifications, and prospect of self measurement of blood pressure.    Today's self reported limited blood pressure was just above goal  (130/80 (per ACC/AHA guidelines). Next due: Next visit   SMBP machine:  next validation due 10/2024  Advised to check and record BP readings  once a week due to patient's schedule    Assessment/Intervention:     CONTINUE lisinopril  10mg  daily    RENAL  Last BMP WNL, Scr 0.8 eGFR >60 mL/min on 10/31/23, recheck, BMP lab visit scheduled    MEDICATION RECONCILIATION  Medication reconciliation was completed today verbally  Refills submitted: none    Will discuss any lab results at  next visit or sooner if clinically indicated.    Planned return date:  Future Appointments   Date Time Provider Department Center   05/21/2024  4:00 PM LAB, [facility] PHLEBOTOMY [facility]   05/28/2024  3:40 PM Michiel Folks, PharmD [facility] RX 300B   04/23/2025  1:00 PM Candyce Garre, DO CMS [facility] AMB         Encouraged patient to keep scheduled pharmacotherapy visits and to call site pharmacist with questions. Provided verbal and written dosing instructions. Pt verbalized understanding of everything discussed and agrees with plan. Pt was given the opportunity to ask questions/voice concerns.

## 2024-05-03 ENCOUNTER — Telehealth (HOSPITAL_BASED_OUTPATIENT_CLINIC_OR_DEPARTMENT_OTHER): Payer: Self-pay

## 2024-05-03 NOTE — Telephone Encounter (Signed)
 Called patient via telephone interpreter ([place] Portuguese - PI#:[id number]) reviewing results and recommendations as noted below. Patient confirmed an understanding and had no questions.      Candyce Garre, DO to [facility]   04/26/24  9:05 AM     Dear RN,      Please:    1. Create Telephone encounter for this patient.   2. Share with the patient the attached results   -Vitamin D  level is mildly low. Recommend adherence to vitamin D  and calcium supplements.   -A1c is stable and in the pre-diabetes range.   -Iron level is within normal limits   -TSH also normal     Plan:   1. As above   2. Type of Outreach: 3 phone call and if unable to reach send letter   3. Document the conversation in the Telephone Encounter and close the encounter, no need to send back to me.     Thank you,   Dr. Candyce

## 2024-05-21 ENCOUNTER — Ambulatory Visit

## 2024-05-25 ENCOUNTER — Ambulatory Visit: Payer: Exclusive Provider Organization | Admitting: Endocrinology

## 2024-05-28 ENCOUNTER — Ambulatory Visit (HOSPITAL_BASED_OUTPATIENT_CLINIC_OR_DEPARTMENT_OTHER)

## 2024-05-28 NOTE — Progress Notes (Deleted)
 Hypertension Management Clinic Visit  Condition(s) addressed at this pharmacotherapy visit: Bill By Complexity/Medical Decision Making (MDM) and Hypertension/Elevated BP (MDM)     Translator used for this visit: Yes, Portuguese. [id number]    SUBJECTIVE   Kayla Garza is a 68 year old female with uncontrolled hypertension who presents to clinic forfollow-up HTN management.     Last pharmacotherapy visit was on 03/12/24  Changes made during visit included:   none    Medications Management:  Reconciled with pt verbally  Manages medication themselves.    HTN Regimen:  lisinopril  10mg  daily     Stress and a lot of problems    Adherence: Patient reports taking medication every day    Self-Monitoring of Blood Pressure (SMBP):   Last validated in clinic - In past year, date: 11/04/23  Mirco brand    Signs/symptoms of hypotension: None reported   Signs/symptoms of hypertension: None reported    Lifestyle:  Diet:   - Caffeine: coffee   - Sodium: home cooked meals - no, eat at work    Physical activity:  home elliptical machine 3-4 times a week for 1 hour    OBJECTIVE       Electrolytes  Lab Results   Component Value Date    NA 141 10/31/2023    K 4.6 10/31/2023    CL 104 10/31/2023    CA 9.4 10/31/2023     Kidney Function  Lab Results   Component Value Date    BUN 13 10/31/2023    CREAT 0.8 10/31/2023    GFR > 60 10/31/2023           Blood Pressure    Most Recent BP Reading(s)  04/25/24 : 139/80  04/24/24 : 147/83  03/01/24 : 176/82      Heart Rate  Most Recent Pulse Reading(s)  04/25/24 : 80  04/24/24 : 75  03/01/24 : (!) 46             ASSESSMENT/RECOMMENDATIONS   BLOOD PRESSURE  Education:   Recommended to not consume more than 2400mg  of sodium/day and to try to reduce sodium intake to 1500mg /day and Recommended to engage in regular aerobic physical activity such as brisk walking for at least 30 min/day most days of the week  Reviewed blood pressure goals, benefits of optimal control, symptoms of hypertension and  hypotension, side effects of drug treatment, drug mechanisms of action, duration of treatment, risk of complications, pathophysiology, complementary lifestyle modifications, and prospect of self measurement of blood pressure.    Today's self reported limited blood pressure was just above goal  (130/80 (per ACC/AHA guidelines). Next due: Next visit   SMBP machine:  next validation due 10/2024  Advised to check and record BP readings  once a week due to patient's schedule    Assessment/Intervention:     CONTINUE lisinopril  10mg  daily    RENAL  Last BMP WNL, Scr 0.8 eGFR >60 mL/min on 10/31/23, recheck, BMP lab visit scheduled    MEDICATION RECONCILIATION  Medication reconciliation was completed today verbally  Refills submitted: none    Will discuss any lab results at next visit or sooner if clinically indicated.    Planned return date:  Future Appointments   Date Time Provider Department Center   04/23/2025  1:00 PM Candyce Garre, DO CMS [facility] AMB         Encouraged patient to keep scheduled pharmacotherapy visits and to call site pharmacist with questions. Provided verbal and written dosing  instructions. Pt verbalized understanding of everything discussed and agrees with plan. Pt was given the opportunity to ask questions/voice concerns.

## 2024-06-05 ENCOUNTER — Ambulatory Visit (HOSPITAL_BASED_OUTPATIENT_CLINIC_OR_DEPARTMENT_OTHER)

## 2024-06-26 ENCOUNTER — Other Ambulatory Visit: Payer: Self-pay

## 2024-06-26 ENCOUNTER — Ambulatory Visit: Attending: Internal Medicine

## 2024-06-26 DIAGNOSIS — I10 Essential (primary) hypertension: Secondary | ICD-10-CM | POA: Diagnosis present

## 2024-06-26 DIAGNOSIS — Z9289 Personal history of other medical treatment: Secondary | ICD-10-CM | POA: Diagnosis present

## 2024-06-26 LAB — BASIC METABOLIC PANEL
ANION GAP: 10 mmol/L (ref 10–22)
BUN (UREA NITROGEN): 15 mg/dL (ref 7–18)
CALCIUM: 9.5 mg/dL (ref 8.5–10.5)
CARBON DIOXIDE: 28 mmol/L (ref 21–32)
CHLORIDE: 105 mmol/L (ref 98–107)
CREATININE: 0.8 mg/dL (ref 0.4–1.2)
ESTIMATED GLOMERULAR FILT RATE: >60 mL/min (ref >60)
Glucose Random: 84 mg/dL (ref 74–160)
POTASSIUM: 4.8 mmol/L (ref 3.5–5.1)
SODIUM: 143 mmol/L (ref 136–145)

## 2024-06-26 NOTE — Progress Notes (Signed)
 Hypertension Management Clinic Visit  Condition(s) addressed at this pharmacotherapy visit: Bill By Complexity/Medical Decision Making (MDM) and Hypertension/Elevated BP (MDM)     Translator used for this visit: Yes, Portuguese. [id number]    SUBJECTIVE   Kayla Garza is a 69 year old female with uncontrolled hypertension who presents to clinic forfollow-up HTN management.     Last pharmacotherapy visit was on 03/12/24  Changes made during visit included:   none    Medications Management:  Reconciled with pt verbally  Manages medication themselves.    HTN Regimen:  lisinopril  10mg  daily     Stress and a lot of problems - update    Sadness as father passed away and wants to talk to someone     Adherence: Patient reports taking medication every day    Self-Monitoring of Blood Pressure (SMBP):   Last validated in clinic - In past year, date: 11/04/23  Mirco brand     SBP DBP HR   05/07/24 132 70 72   05/11/24 130 66 76   05/15/24 133 76 74   06/07/24 129 77 79   06/10/24 133 77    06/11/24 130 73 82   06/12/24 122 71 69   06/16/24 125 78 90   06/18/24 138 76 81   06/21/24 134 75 71   06/23/24 131 74 75   AVG [id number] 76.9       Signs/symptoms of hypotension: None reported   Signs/symptoms of hypertension: None reported    Lifestyle:  Diet:   - Caffeine: coffee   - Sodium: home cooked meals - no, eat at work    Physical activity:  home elliptical machine 3-4 times a week for 1 hour - update has not been doing anything     OBJECTIVE     159/89 HR 72   151/82 HR 73     Electrolytes  Lab Results   Component Value Date    NA 143 06/26/2024    K 4.8 06/26/2024    CL 105 06/26/2024    CA 9.5 06/26/2024     Kidney Function  Lab Results   Component Value Date    BUN 15 06/26/2024    CREAT 0.8 06/26/2024    GFR > 60 06/26/2024           Blood Pressure    Most Recent BP Reading(s)  04/25/24 : 139/80  04/24/24 : 147/83  03/01/24 : 176/82      Heart Rate  Most Recent Pulse Reading(s)  04/25/24 : 80  04/24/24 :  75  03/01/24 : (!) 46             ASSESSMENT/RECOMMENDATIONS   BLOOD PRESSURE  Education:   Recommended to not consume more than 2400mg  of sodium/day and to try to reduce sodium intake to 1500mg /day and Recommended to engage in regular aerobic physical activity such as brisk walking for at least 30 min/day most days of the week  Reviewed blood pressure goals, benefits of optimal control, symptoms of hypertension and hypotension, side effects of drug treatment, drug mechanisms of action, duration of treatment, risk of complications, pathophysiology, complementary lifestyle modifications, and prospect of self measurement of blood pressure.    Today's self reported  blood pressure was at goal while clinic BP elevated. (130/80 (per ACC/AHA guidelines). Next due: Next visit   SMBP machine:  next validation due 10/2025  Advised to check and record BP readings  once a week due to patient's schedule  Assessment/Intervention:     CONTINUE lisinopril  10mg  daily    RENAL  Last BMP WNL, Scr 0.8 eGFR >60 mL/min on 10/31/23     MEDICATION RECONCILIATION  Medication reconciliation was completed today verbally  Refills submitted: none    Will discuss any lab results at next visit or sooner if clinically indicated.    Planned return date:  Future Appointments   Date Time Provider Department Center   07/30/2024  3:40 PM Michiel Folks, PharmD [facility] RX 300[facility]   08/01/2024  3:40 PM Blakelyn, Dinges, APRN [facility] CARE 300B   04/23/2025  1:00 PM Candyce Garre, DO CMS [facility] AMB         Encouraged patient to keep scheduled pharmacotherapy visits and to call site pharmacist with questions. Provided verbal and written dosing instructions. Pt verbalized understanding of everything discussed and agrees with plan. Pt was given the opportunity to ask questions/voice concerns.

## 2024-07-03 ENCOUNTER — Ambulatory Visit (HOSPITAL_BASED_OUTPATIENT_CLINIC_OR_DEPARTMENT_OTHER): Payer: Self-pay

## 2024-07-11 ENCOUNTER — Telehealth (HOSPITAL_BASED_OUTPATIENT_CLINIC_OR_DEPARTMENT_OTHER): Payer: Self-pay | Admitting: Internal Medicine

## 2024-07-11 NOTE — Telephone Encounter (Signed)
 Called pt to give her my condolences d/t dad's passing.  Couldn't reach her. LVOVM  She has appt with me in Feb. Offered televisit before, if needed      Hadassah Nettle, APRN, 07/11/2024

## 2024-07-23 ENCOUNTER — Encounter (HOSPITAL_BASED_OUTPATIENT_CLINIC_OR_DEPARTMENT_OTHER): Payer: Self-pay | Admitting: Internal Medicine

## 2024-07-23 ENCOUNTER — Other Ambulatory Visit (HOSPITAL_BASED_OUTPATIENT_CLINIC_OR_DEPARTMENT_OTHER): Payer: Self-pay | Admitting: Internal Medicine

## 2024-07-23 DIAGNOSIS — I10 Essential (primary) hypertension: Secondary | ICD-10-CM

## 2024-07-23 DIAGNOSIS — Z0000 Encounter for general adult medical examination without abnormal findings: Secondary | ICD-10-CM

## 2024-07-25 ENCOUNTER — Other Ambulatory Visit (HOSPITAL_BASED_OUTPATIENT_CLINIC_OR_DEPARTMENT_OTHER): Payer: Self-pay | Admitting: Internal Medicine

## 2024-07-25 DIAGNOSIS — I10 Essential (primary) hypertension: Secondary | ICD-10-CM

## 2024-07-30 ENCOUNTER — Ambulatory Visit: Payer: Self-pay

## 2024-08-01 ENCOUNTER — Ambulatory Visit: Payer: Self-pay | Admitting: Internal Medicine

## 2024-08-16 ENCOUNTER — Ambulatory Visit: Payer: Self-pay

## 2025-04-23 ENCOUNTER — Ambulatory Visit: Payer: Self-pay
